# Patient Record
Sex: Female | Born: 1970 | Race: Black or African American | Hispanic: No | Marital: Married | Smoking: Never smoker
Health system: Southern US, Community
[De-identification: ages and names within clinical notes are randomized; demographics above are authoritative.]

## PROBLEM LIST (undated history)

## (undated) DIAGNOSIS — Z9989 Dependence on other enabling machines and devices: Secondary | ICD-10-CM

## (undated) DIAGNOSIS — R569 Unspecified convulsions: Secondary | ICD-10-CM

## (undated) DIAGNOSIS — R7303 Prediabetes: Secondary | ICD-10-CM

## (undated) DIAGNOSIS — E079 Disorder of thyroid, unspecified: Secondary | ICD-10-CM

## (undated) DIAGNOSIS — S83249A Other tear of medial meniscus, current injury, unspecified knee, initial encounter: Secondary | ICD-10-CM

## (undated) DIAGNOSIS — M549 Dorsalgia, unspecified: Secondary | ICD-10-CM

## (undated) DIAGNOSIS — G4733 Obstructive sleep apnea (adult) (pediatric): Secondary | ICD-10-CM

## (undated) DIAGNOSIS — E7800 Pure hypercholesterolemia, unspecified: Secondary | ICD-10-CM

## (undated) DIAGNOSIS — E785 Hyperlipidemia, unspecified: Secondary | ICD-10-CM

## (undated) DIAGNOSIS — D649 Anemia, unspecified: Secondary | ICD-10-CM

## (undated) DIAGNOSIS — I10 Essential (primary) hypertension: Secondary | ICD-10-CM

## (undated) DIAGNOSIS — M1712 Unilateral primary osteoarthritis, left knee: Secondary | ICD-10-CM

## (undated) DIAGNOSIS — M7021 Olecranon bursitis, right elbow: Secondary | ICD-10-CM

## (undated) HISTORY — DX: Dependence on other enabling machines and devices: Z99.89

## (undated) HISTORY — DX: Pure hypercholesterolemia, unspecified: E78.00

## (undated) HISTORY — DX: Unilateral primary osteoarthritis, left knee: M17.12

## (undated) HISTORY — DX: Anemia, unspecified: D64.9

## (undated) HISTORY — DX: Olecranon bursitis, right elbow: M70.21

## (undated) HISTORY — DX: Hyperlipidemia, unspecified: E78.5

## (undated) HISTORY — DX: Prediabetes: R73.03

## (undated) HISTORY — DX: Dorsalgia, unspecified: M54.9

## (undated) HISTORY — DX: Obstructive sleep apnea (adult) (pediatric): G47.33

## (undated) HISTORY — DX: Other tear of medial meniscus, current injury, unspecified knee, initial encounter: S83.249A

---

## 2009-10-24 ENCOUNTER — Other Ambulatory Visit: Admission: RE | Admit: 2009-10-24 | Discharge: 2009-10-24 | Payer: Self-pay | Admitting: *Deleted

## 2011-07-22 ENCOUNTER — Other Ambulatory Visit: Payer: Self-pay | Admitting: Family Medicine

## 2011-07-22 ENCOUNTER — Other Ambulatory Visit (HOSPITAL_COMMUNITY)
Admission: RE | Admit: 2011-07-22 | Discharge: 2011-07-22 | Disposition: A | Discharge status: Home / Self Care | Payer: BC Managed Care – PPO | Source: Ambulatory Visit | Attending: Internal Medicine | Admitting: Internal Medicine

## 2011-07-22 DIAGNOSIS — R2230 Localized swelling, mass and lump, unspecified upper limb: Secondary | ICD-10-CM

## 2011-07-22 DIAGNOSIS — R222 Localized swelling, mass and lump, trunk: Secondary | ICD-10-CM

## 2011-07-22 DIAGNOSIS — Z01419 Encounter for gynecological examination (general) (routine) without abnormal findings: Secondary | ICD-10-CM | POA: Insufficient documentation

## 2011-07-29 ENCOUNTER — Other Ambulatory Visit: Payer: Self-pay

## 2012-03-18 ENCOUNTER — Other Ambulatory Visit: Payer: Self-pay | Admitting: Family Medicine

## 2012-03-18 DIAGNOSIS — Z1231 Encounter for screening mammogram for malignant neoplasm of breast: Secondary | ICD-10-CM

## 2012-04-01 ENCOUNTER — Ambulatory Visit: Payer: BC Managed Care – PPO

## 2013-04-06 ENCOUNTER — Other Ambulatory Visit: Payer: Self-pay | Admitting: Family Medicine

## 2013-04-06 ENCOUNTER — Other Ambulatory Visit (HOSPITAL_COMMUNITY)
Admission: RE | Admit: 2013-04-06 | Discharge: 2013-04-06 | Disposition: A | Discharge status: Home / Self Care | Payer: BC Managed Care – PPO | Source: Ambulatory Visit | Attending: Family Medicine | Admitting: Family Medicine

## 2013-04-06 DIAGNOSIS — Z0000 Encounter for general adult medical examination without abnormal findings: Secondary | ICD-10-CM | POA: Insufficient documentation

## 2013-04-29 ENCOUNTER — Other Ambulatory Visit: Payer: Self-pay | Admitting: Family Medicine

## 2013-04-29 ENCOUNTER — Other Ambulatory Visit: Payer: Self-pay

## 2013-04-29 DIAGNOSIS — Z1231 Encounter for screening mammogram for malignant neoplasm of breast: Secondary | ICD-10-CM

## 2013-05-31 ENCOUNTER — Other Ambulatory Visit: Payer: Self-pay | Admitting: Family Medicine

## 2013-05-31 ENCOUNTER — Ambulatory Visit: Payer: BC Managed Care – PPO

## 2013-05-31 ENCOUNTER — Ambulatory Visit
Admission: RE | Admit: 2013-05-31 | Discharge: 2013-05-31 | Disposition: A | Discharge status: Home / Self Care | Payer: 59 | Source: Ambulatory Visit

## 2013-05-31 DIAGNOSIS — R928 Other abnormal and inconclusive findings on diagnostic imaging of breast: Secondary | ICD-10-CM

## 2013-05-31 DIAGNOSIS — Z1231 Encounter for screening mammogram for malignant neoplasm of breast: Secondary | ICD-10-CM

## 2013-06-16 ENCOUNTER — Ambulatory Visit
Admission: RE | Admit: 2013-06-16 | Discharge: 2013-06-16 | Disposition: A | Discharge status: Home / Self Care | Payer: 59 | Source: Ambulatory Visit | Attending: Family Medicine | Admitting: Family Medicine

## 2013-06-16 DIAGNOSIS — R928 Other abnormal and inconclusive findings on diagnostic imaging of breast: Secondary | ICD-10-CM

## 2013-10-10 ENCOUNTER — Emergency Department (HOSPITAL_COMMUNITY): Payer: 59

## 2013-10-10 ENCOUNTER — Encounter (HOSPITAL_COMMUNITY): Payer: Self-pay | Admitting: Emergency Medicine

## 2013-10-10 ENCOUNTER — Emergency Department (HOSPITAL_COMMUNITY)
Admission: EM | Admit: 2013-10-10 | Discharge: 2013-10-10 | Disposition: A | Discharge status: Home / Self Care | Payer: 59 | Attending: Emergency Medicine | Admitting: Emergency Medicine

## 2013-10-10 ENCOUNTER — Encounter: Payer: Self-pay | Admitting: *Deleted

## 2013-10-10 DIAGNOSIS — R609 Edema, unspecified: Secondary | ICD-10-CM | POA: Insufficient documentation

## 2013-10-10 DIAGNOSIS — E079 Disorder of thyroid, unspecified: Secondary | ICD-10-CM | POA: Insufficient documentation

## 2013-10-10 DIAGNOSIS — Z79899 Other long term (current) drug therapy: Secondary | ICD-10-CM | POA: Insufficient documentation

## 2013-10-10 DIAGNOSIS — R791 Abnormal coagulation profile: Secondary | ICD-10-CM | POA: Insufficient documentation

## 2013-10-10 DIAGNOSIS — R079 Chest pain, unspecified: Secondary | ICD-10-CM

## 2013-10-10 DIAGNOSIS — I10 Essential (primary) hypertension: Secondary | ICD-10-CM | POA: Insufficient documentation

## 2013-10-10 DIAGNOSIS — R072 Precordial pain: Secondary | ICD-10-CM | POA: Insufficient documentation

## 2013-10-10 DIAGNOSIS — R569 Unspecified convulsions: Secondary | ICD-10-CM | POA: Insufficient documentation

## 2013-10-10 DIAGNOSIS — R0602 Shortness of breath: Secondary | ICD-10-CM | POA: Insufficient documentation

## 2013-10-10 HISTORY — DX: Unspecified convulsions: R56.9

## 2013-10-10 HISTORY — DX: Disorder of thyroid, unspecified: E07.9

## 2013-10-10 HISTORY — DX: Essential (primary) hypertension: I10

## 2013-10-10 LAB — BASIC METABOLIC PANEL
BUN: 22 mg/dL (ref 6–23)
CO2: 30 mEq/L (ref 19–32)
Calcium: 9.7 mg/dL (ref 8.4–10.5)
Chloride: 98 mEq/L (ref 96–112)
Creatinine, Ser: 0.77 mg/dL (ref 0.50–1.10)
GFR calc Af Amer: >90 mL/min (ref >90)
GFR calc non Af Amer: >90 mL/min (ref >90)
Glucose, Bld: 96 mg/dL (ref 70–99)
Potassium: 3.9 mEq/L (ref 3.5–5.1)
Sodium: 135 mEq/L (ref 135–145)

## 2013-10-10 LAB — CBC
HCT: 34.6 % — ABNORMAL LOW (ref 36.0–46.0)
Hemoglobin: 11.2 g/dL — ABNORMAL LOW (ref 12.0–15.0)
MCH: 24 pg — ABNORMAL LOW (ref 26.0–34.0)
MCHC: 32.4 g/dL (ref 30.0–36.0)
MCV: 74.1 fL — ABNORMAL LOW (ref 78.0–100.0)
Platelets: 332 10*3/uL (ref 150–400)
RBC: 4.67 MIL/uL (ref 3.87–5.11)
RDW: 15 % (ref 11.5–15.5)
WBC: 10.9 10*3/uL — ABNORMAL HIGH (ref 4.0–10.5)

## 2013-10-10 LAB — POCT I-STAT TROPONIN I: Troponin i, poc: 0 ng/mL (ref 0.00–0.08)

## 2013-10-10 LAB — D-DIMER, QUANTITATIVE: D-Dimer, Quant: 0.85 ug/mL-FEU — ABNORMAL HIGH (ref 0.00–0.48)

## 2013-10-10 MED ORDER — IOHEXOL 350 MG/ML SOLN
80.0000 mL | Freq: Once | INTRAVENOUS | Status: AC | PRN
  Administered 2013-10-10: 80 mL via INTRAVENOUS

## 2013-10-10 NOTE — Progress Notes (Signed)
Patient ID: Pam Mckee, female   DOB: May 23, 1971, 42 y.o.   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  Pain is sharp, denies radiation down arms or shortness of breath.  Notes she had spicy jerk chicken last night for dinner.  Takes HCTZ 12.5 mg qd.  BP was 170/105, pulse 76.  Dr. Darrick Penna evaluated, heart and lung sounds normal.  EKG done- it was normal also.  Pt called and scheduled an appointment with her PCP for today at 11:15 am.

## 2013-10-10 NOTE — ED Provider Notes (Signed)
CSN: [id number]     Arrival date & time 10/10/13  1737 History   First MD Initiated Contact with Patient 10/10/13 1810     No chief complaint on file.  (Consider location/radiation/quality/duration/timing/severity/associated sxs/prior Treatment) The history is provided by the patient.  Posey Jasmin is a 42 y.o. female history of hypertension, hypothyroid here presenting with chest pain. Substernal chest pain, a "pinching" feeling today. Took some deep breath and the pain improved. Went to see her doctor and had an elevated d-dimer. Denies shortness of breath or calf pain. She does have some bilateral leg swelling. She drove to [place] and back over the weekend. Denies any history of blood clots. No CAD risk factors.    Past Medical History  Diagnosis Date  . Seizures   . Thyroid disease     hypo  . Hypertension    History reviewed. No pertinent past surgical history. No family history on file. History  Substance Use Topics  . Smoking status: Never Smoker   . Smokeless tobacco: Not on file  . Alcohol Use: No   OB History   Grav Para Term Preterm Abortions TAB SAB Ect Mult Living                 Review of Systems  Cardiovascular: Positive for chest pain.  All other systems reviewed and are negative.    Allergies  Review of patient's allergies indicates no known allergies.  Home Medications   Current Outpatient Rx  Name  Route  Sig  Dispense  Refill  . hydrochlorothiazide (MICROZIDE) 12.5 MG capsule   Oral   Take 12.5 mg by mouth daily.         Marland Kitchen levothyroxine (SYNTHROID, LEVOTHROID) 50 MCG tablet   Oral   Take 50 mcg by mouth daily before breakfast.         . losartan (COZAAR) 50 MG tablet   Oral   Take 50 mg by mouth daily.          BP 154/98  Pulse 72  Temp(Src) 98.6 F (37 C)  Resp 16  Ht 5\' 6"  (1.676 m)  Wt 292 lb (132.45 kg)  BMI 47.15 kg/m2  SpO2 100%  LMP 10/03/2013 Physical Exam  Nursing note and vitals reviewed. Constitutional: She is  oriented to person, place, and time. She appears well-developed and well-nourished.  HENT:  Head: Normocephalic.  Mouth/Throat: Oropharynx is clear and moist.  Eyes: Conjunctivae are normal. Pupils are equal, round, and reactive to light.  Neck: Normal range of motion. Neck supple.  Cardiovascular: Normal rate, regular rhythm and normal heart sounds.   Pulmonary/Chest: Effort normal and breath sounds normal. No respiratory distress. She has no wheezes. She has no rales. She exhibits no tenderness.  Abdominal: Soft. Bowel sounds are normal. She exhibits no distension. There is no tenderness. There is no rebound.  Musculoskeletal: Normal range of motion.  1+ edema bilaterally. No calf tenderness   Neurological: She is alert and oriented to person, place, and time.  Skin: Skin is warm and dry.  Psychiatric: She has a normal mood and affect. Her behavior is normal. Judgment and thought content normal.    ED Course  Procedures (including critical care time) Labs Review Labs Reviewed  CBC - Abnormal; Notable for the following:    WBC 10.9 (*)    Hemoglobin 11.2 (*)    HCT 34.6 (*)    MCV 74.1 (*)    MCH 24.0 (*)    All other components within  normal limits  D-DIMER, QUANTITATIVE - Abnormal; Notable for the following:    D-Dimer, Quant 0.85 (*)    All other components within normal limits  BASIC METABOLIC PANEL  POCT I-STAT TROPONIN I   Imaging Review Dg Chest 2 View  10/10/2013   CLINICAL DATA:  CHEST PAIN.  HYPERTENSION.  EXAM: CHEST  2 VIEW  COMPARISON:  None.  FINDINGS: There is borderline cardiomegaly. Pulmonary vascularity is normal.  There is vague increased density over the medial aspect of the right clavicle in the right upper lobe. I recommend an apical lordotic view of the chest to exclude a mass at that region. Lungs are otherwise clear. No osseous abnormality of significance.  IMPRESSION: Subtle increased densities in the right upper lobe which probably represents a  confluence of normal structures. Apical lordotic view of the chest recommended for further evaluation.   Electronically Signed   By: Geanie Cooley M.D.   On: 10/10/2013 19:25   Ct Angio Chest Pe W/cm &/or Wo Cm  10/10/2013   CLINICAL DATA:  Chest pain and shortness of breath.  EXAM: CT ANGIOGRAPHY CHEST WITH CONTRAST  TECHNIQUE: Multidetector CT imaging of the chest was performed using the standard protocol during bolus administration of intravenous contrast. Multiplanar CT image reconstructions including MIPs were obtained to evaluate the vascular anatomy.  CONTRAST:  80mL OMNIPAQUE IOHEXOL 350 MG/ML SOLN  COMPARISON:  Chest x-ray dated 10/10/2013  FINDINGS: There is cardiomegaly. There are no infiltrates or effusions. No definitive pulmonary emboli. There is motion artifact particularly in the lower lobes. The visualized portion of the upper abdomen is normal. No acute osseous abnormality.  Review of the MIP images confirms the above findings.  IMPRESSION: No pulmonary emboli. Slight cardiomegaly. The area of concern on chest x-ray of 10/10/2013 in the medial aspect of the right upper lobe represents a confluence of vessels. There is no mass or other worrisome abnormality in that area.   Electronically Signed   By: Geanie Cooley M.D.   On: 10/10/2013 20:01    EKG Interpretation     Ventricular Rate:  76 PR Interval:  178 QRS Duration: 88 QT Interval:  384 QTC Calculation: 432 R Axis:   49 Text Interpretation:  Normal sinus rhythm Normal ECG No previous ECGs available            MDM  No diagnosis found. Peony Barner is a 42 y.o. female here with elevated d-dimer. Symptoms not consistent with PE so will recheck d-dimer. If still elevated will get ct angio chest. I doubt ACS.   8:23 PM D-dimer mildly elevated. CT showed confluence of vessels, no PE. Stable for outpatient f/u.   Richardean Canal, MD 10/10/13 2024

## 2013-10-10 NOTE — ED Notes (Signed)
Pt was sent here by her pcp for a D-dimer of 1.01.  Her D-dimer was drawn b/c she felt "pinching" in her chest that lasted several seconds.  She took deep breaths and the pain decreased. EKG's were normal per pt.  Presently denies sob or chest pain, though states she was wheezing yesterday.  Drove to [place] and back over the weekend.

## 2014-05-02 ENCOUNTER — Ambulatory Visit (INDEPENDENT_AMBULATORY_CARE_PROVIDER_SITE_OTHER): Payer: 59 | Admitting: Emergency Medicine

## 2014-05-02 VITALS — BP 142/88 | HR 101 | Temp 99.0°F | Resp 17 | Ht 65.5 in | Wt 285.0 lb

## 2014-05-02 DIAGNOSIS — J209 Acute bronchitis, unspecified: Secondary | ICD-10-CM

## 2014-05-02 DIAGNOSIS — R05 Cough: Secondary | ICD-10-CM

## 2014-05-02 DIAGNOSIS — R059 Cough, unspecified: Secondary | ICD-10-CM

## 2014-05-02 DIAGNOSIS — R0600 Dyspnea, unspecified: Secondary | ICD-10-CM

## 2014-05-02 DIAGNOSIS — R0609 Other forms of dyspnea: Secondary | ICD-10-CM

## 2014-05-02 DIAGNOSIS — R0989 Other specified symptoms and signs involving the circulatory and respiratory systems: Secondary | ICD-10-CM

## 2014-05-02 MED ORDER — PREDNISONE 20 MG PO TABS: 40.0000 mg | ORAL_TABLET | Freq: Every day | ORAL | Status: DC

## 2014-05-02 MED ORDER — AZITHROMYCIN 250 MG PO TABS: ORAL_TABLET | ORAL | Status: DC

## 2014-05-02 MED ORDER — ALBUTEROL SULFATE HFA 108 (90 BASE) MCG/ACT IN AERS: 2.0000 | INHALATION_SPRAY | RESPIRATORY_TRACT | Status: DC | PRN

## 2014-05-02 NOTE — Progress Notes (Addendum)
   Subjective:    Patient ID: Pam Mckee, female    DOB: 1971/02/08, 43 y.o.   MRN: [id number]  HPI 43 yo female with complaint of cough and shortness of breath for past two days.  No fever.  Positive productive cough with yellow sputum.  Nonsmoker.  No history of prior episodes.  No chest pain.  No runny nose.  Denies other URI type symptoms.  PPMH:  Hypertension  SH:  Non smoker, no alcohol   Review of Systems  Constitutional: Negative for fever and chills.  HENT: Negative for congestion, postnasal drip, rhinorrhea and sinus pressure.   Respiratory: Positive for cough, chest tightness, shortness of breath and wheezing.   Cardiovascular: Negative for chest pain.  Gastrointestinal: Negative for nausea, vomiting, diarrhea and constipation.       Objective:   Physical Exam Blood pressure 142/88, pulse 101, temperature 99 F (37.2 C), temperature source Oral, resp. rate 17, height 5' 5.5" (1.664 m), weight 285 lb (129.275 kg), last menstrual period 04/18/2014, SpO2 97.00%. Body mass index is 46.69 kg/(m^2). Well-developed, well nourished female  who is awake, alert and oriented, in NAD. HEENT: [place]/AT, PERRL, EOMI.  Sclera and conjunctiva are clear.  OP is clear. Neck: supple, non-tender, no lymphadenopathy, thyromegaly. Heart: RRR, no murmur Lungs: normal effort, faint end expiratory wheeze. Abdomen: normo-active bowel sounds, supple, non-tender, no mass or organomegaly. Extremities: no cyanosis, clubbing or edema. Skin: warm and dry without rash. Psychologic: good mood and appropriate affect, normal speech and behavior.      Assessment & Plan:  Bronchitis  Treat with Zpak, prednisone burst and albuterol prn.  Follow up as needed.  I have reviewed and agree with documentation. Robert P. Laney Pastor, M.D.

## 2014-06-29 ENCOUNTER — Other Ambulatory Visit: Payer: Self-pay | Admitting: Family Medicine

## 2014-06-29 ENCOUNTER — Other Ambulatory Visit (HOSPITAL_COMMUNITY)
Admission: RE | Admit: 2014-06-29 | Discharge: 2014-06-29 | Disposition: A | Discharge status: Home / Self Care | Payer: 59 | Source: Ambulatory Visit | Attending: Family Medicine | Admitting: Family Medicine

## 2014-06-29 DIAGNOSIS — Z0000 Encounter for general adult medical examination without abnormal findings: Secondary | ICD-10-CM | POA: Insufficient documentation

## 2014-07-05 LAB — CYTOLOGY - PAP

## 2014-09-19 ENCOUNTER — Other Ambulatory Visit: Payer: Self-pay | Admitting: Family Medicine

## 2014-09-19 DIAGNOSIS — R921 Mammographic calcification found on diagnostic imaging of breast: Secondary | ICD-10-CM

## 2014-09-28 ENCOUNTER — Ambulatory Visit
Admission: RE | Admit: 2014-09-28 | Discharge: 2014-09-28 | Disposition: A | Discharge status: Home / Self Care | Payer: 59 | Source: Ambulatory Visit | Attending: Family Medicine | Admitting: Family Medicine

## 2014-09-28 DIAGNOSIS — R921 Mammographic calcification found on diagnostic imaging of breast: Secondary | ICD-10-CM

## 2015-02-09 ENCOUNTER — Encounter: Payer: Self-pay | Admitting: Family Medicine

## 2015-02-09 ENCOUNTER — Ambulatory Visit (INDEPENDENT_AMBULATORY_CARE_PROVIDER_SITE_OTHER): Payer: 59 | Admitting: Family Medicine

## 2015-02-09 VITALS — BP 140/80 | HR 83 | Temp 98.0°F | Resp 16 | Ht 66.0 in | Wt 273.6 lb

## 2015-02-09 DIAGNOSIS — I10 Essential (primary) hypertension: Secondary | ICD-10-CM | POA: Diagnosis not present

## 2015-02-09 DIAGNOSIS — E049 Nontoxic goiter, unspecified: Secondary | ICD-10-CM

## 2015-02-09 DIAGNOSIS — E039 Hypothyroidism, unspecified: Secondary | ICD-10-CM | POA: Diagnosis not present

## 2015-02-09 MED ORDER — LEVOTHYROXINE SODIUM 50 MCG PO TABS: 50.0000 ug | ORAL_TABLET | Freq: Every day | ORAL | Status: DC

## 2015-02-09 MED ORDER — LOSARTAN POTASSIUM-HCTZ 50-12.5 MG PO TABS: 1.0000 | ORAL_TABLET | Freq: Every day | ORAL | Status: DC

## 2015-02-09 NOTE — Patient Instructions (Signed)
Goiter Goiter is an enlarged thyroid gland. The thyroid gland sits at the base of the front of the neck. The gland produces hormones that regulate mood, body temperature, pulse rate, and digestion. Most goiters are painless and are not a cause for serious concern. Goiters and conditions that cause goiters can be treated if necessary.  CAUSES  Common causes of goiter include:  Graves disease (causes too much hormone to be produced [hyperthyroidism]).  Hashimoto disease (causes too little hormone to be produced [hypothyroidism]).  Thyroiditis (inflammation of the thyroid sometimes caused by virus or pregnancy).  Nodular goiter (small bumps form; sometimes called toxic nodular goiter).  Pregnancy.  Thyroid cancer (very few goiters with nodules are cancerous).  Certain medications.  Radiation exposure.  Iodine deficiency (more common in developing countries in inland populations). RISK FACTORS Risk factors for goiter include:  A family history of goiter.  Female gender.  Inadequate iodine in the diet.  Age older than 25 years. SYMPTOMS  Many goiters do not cause symptoms. When symptoms do occur, they may include:  Swelling in the lower part of the neck. This swelling can range from a very small bump to a large lump.  A tight feeling in the throat.  A hoarse voice. Less commonly, a goiter may result in:  Coughing.  Wheezing.  Difficulty swallowing.  Difficulty breathing.  Bulging neck veins.  Dizziness. When a goiter is the result of hyperthyroidism, symptoms may include:  Rapid or irregular heartbeat.  Sickness in your stomach (nausea).  Vomiting.  Diarrhea.  Shaking.  Irritable feeling.  Bulging eyes.  Weight loss.  Heat sensitivity.  Anxiety. When a goiter is the result of hypothyroidism, symptoms may include:  Tiredness.  Dry skin.  Constipation.  Weight gain.  Irregular menstrual cycle.  Depressed mood.  Sensitivity to  cold. DIAGNOSIS  Tests used to diagnose goiter include:  A physical exam.  Blood tests, including thyroid hormone levels and antibody testing.  Ultrasonography, computerized X-ray scan (computed tomography, CT) or computerized magnetic scan (magnetic resonance imaging, MRI).  Thyroid scan (imaging along with safe radioactive injection).  Tissue sample taken (biopsy) of nodules. This is sometimes done to confirm that the nodules are not cancerous. TREATMENT  Treatment will depend on the cause of the goiter. Treatment may include:  Monitoring. In some cases, no treatment is necessary, and your doctor will monitor your condition at regular checkups.  Medications and supplements. Thyroid medication (thyroid hormone replacement) is available for hyperthyroidism and hypothyroidism.  If inflammation is the cause, over-the-counter medication or steroid medication may be recommended.  Goiters caused by iodine deficiency can be treated with iodine supplements or changes in diet.  Radioactive iodine treatment. Radioactive iodine is injected into the blood. It travels to the thyroid gland, kills thyroid cells, and reduces the size of the gland. This is only used when the thyroid gland is overactive. Lifelong thyroid hormone medication is often necessary after this treatment.  Surgery. A procedure to remove all or part of the gland may be recommended in severe cases or when cancer is the cause. Hormones can be taken to replace the hormones normally produced by the thyroid. HOME CARE INSTRUCTIONS   Take medications as directed.  Follow your caregiver's recommendations for any dietary changes.  Follow up with your caregiver for further examination and testing, as directed. PREVENTION   If you have a family history of goiter, discuss screening with your doctor.  Make sure you are getting enough iodine in your diet.  Use  of iodized table salt can help prevent iodine deficiency. Document  Released: 05/14/2010 Document Revised: 04/10/2014 Document Reviewed: 05/14/2010 [facility] Patient Information 2015 [place], [place]. This information is not intended to replace advice given to you by your health care provider. Make sure you discuss any questions you have with your health care provider. Managing Your High Blood Pressure Blood pressure is a measurement of how forceful your blood is pressing against the walls of the arteries. Arteries are muscular tubes within the circulatory system. Blood pressure does not stay the same. Blood pressure rises when you are active, excited, or nervous; and it lowers during sleep and relaxation. If the numbers measuring your blood pressure stay above normal most of the time, you are at risk for health problems. High blood pressure (hypertension) is a long-term (chronic) condition in which blood pressure is elevated. A blood pressure reading is recorded as two numbers, such as 120 over 80 (or 120/80). The first, higher number is called the systolic pressure. It is a measure of the pressure in your arteries as the heart beats. The second, lower number is called the diastolic pressure. It is a measure of the pressure in your arteries as the heart relaxes between beats.  Keeping your blood pressure in a normal range is important to your overall health and prevention of health problems, such as heart disease and stroke. When your blood pressure is uncontrolled, your heart has to work harder than normal. High blood pressure is a very common condition in adults because blood pressure tends to rise with age. Men and women are equally likely to have hypertension but at different times in life. Before age 68, men are more likely to have hypertension. After 43 years of age, women are more likely to have it. Hypertension is especially common in African Americans. This condition often has no signs or symptoms. The cause of the condition is usually not known. Your caregiver can help  you come up with a plan to keep your blood pressure in a normal, healthy range. BLOOD PRESSURE STAGES Blood pressure is classified into four stages: normal, prehypertension, stage 1, and stage 2. Your blood pressure reading will be used to determine what type of treatment, if any, is necessary. Appropriate treatment options are tied to these four stages:  Normal  Systolic pressure (mm Hg): below 120.  Diastolic pressure (mm Hg): below 80. Prehypertension  Systolic pressure (mm Hg): 120 to 139.  Diastolic pressure (mm Hg): 80 to 89. Stage1  Systolic pressure (mm Hg): 140 to 159.  Diastolic pressure (mm Hg): 90 to 99. Stage2  Systolic pressure (mm Hg): 160 or above.  Diastolic pressure (mm Hg): 100 or above. RISKS RELATED TO HIGH BLOOD PRESSURE Managing your blood pressure is an important responsibility. Uncontrolled high blood pressure can lead to:  A heart attack.  A stroke.  A weakened blood vessel (aneurysm).  Heart failure.  Kidney damage.  Eye damage.  Metabolic syndrome.  Memory and concentration problems. HOW TO MANAGE YOUR BLOOD PRESSURE Blood pressure can be managed effectively with lifestyle changes and medicines (if needed). Your caregiver will help you come up with a plan to bring your blood pressure within a normal range. Your plan should include the following: Education  Read all information provided by your caregivers about how to control blood pressure.  Educate yourself on the latest guidelines and treatment recommendations. New research is always being done to further define the risks and treatments for high blood pressure. Lifestylechanges  Control your  weight.  Avoid smoking.  Stay physically active.  Reduce the amount of salt in your diet.  Reduce stress.  Control any chronic conditions, such as high cholesterol or diabetes.  Reduce your alcohol intake. Medicines  Several medicines (antihypertensive medicines) are available, if  needed, to bring blood pressure within a normal range. Communication  Review all the medicines you take with your caregiver because there may be side effects or interactions.  Talk with your caregiver about your diet, exercise habits, and other lifestyle factors that may be contributing to high blood pressure.  See your caregiver regularly. Your caregiver can help you create and adjust your plan for managing high blood pressure. RECOMMENDATIONS FOR TREATMENT AND FOLLOW-UP  The following recommendations are based on current guidelines for managing high blood pressure in nonpregnant adults. Use these recommendations to identify the proper follow-up period or treatment option based on your blood pressure reading. You can discuss these options with your caregiver.  Systolic pressure of 408 to 144 or diastolic pressure of 80 to 89: Follow up with your caregiver as directed.  Systolic pressure of 818 to 563 or diastolic pressure of 90 to 100: Follow up with your caregiver within 2 months.  Systolic pressure above 149 or diastolic pressure above 702: Follow up with your caregiver within 1 month.  Systolic pressure above 637 or diastolic pressure above 858: Consider antihypertensive therapy; follow up with your caregiver within 1 week.  Systolic pressure above 850 or diastolic pressure above 277: Begin antihypertensive therapy; follow up with your caregiver within 1 week. Document Released: 08/18/2012 Document Reviewed: 08/18/2012 [facility] Patient Information 2015 [place]. This information is not intended to replace advice given to you by your health care provider. Make sure you discuss any questions you have with your health care provider. DASH Eating Plan DASH stands for "Dietary Approaches to Stop Hypertension." The DASH eating plan is a healthy eating plan that has been shown to reduce high blood pressure (hypertension). Additional health benefits may include reducing the risk of type 2  diabetes mellitus, heart disease, and stroke. The DASH eating plan may also help with weight loss. WHAT DO I NEED TO KNOW ABOUT THE DASH EATING PLAN? For the DASH eating plan, you will follow these general guidelines:  Choose foods with a percent daily value for sodium of less than 5% (as listed on the food label).  Use salt-free seasonings or herbs instead of table salt or sea salt.  Check with your health care provider or pharmacist before using salt substitutes.  Eat lower-sodium products, often labeled as "lower sodium" or "no salt added."  Eat fresh foods.  Eat more vegetables, fruits, and low-fat dairy products.  Choose whole grains. Look for the word "whole" as the first word in the ingredient list.  Choose fish and skinless chicken or [place] more often than red meat. Limit fish, poultry, and meat to 6 oz (170 g) each day.  Limit sweets, desserts, sugars, and sugary drinks.  Choose heart-healthy fats.  Limit cheese to 1 oz (28 g) per day.  Eat more home-cooked food and less restaurant, buffet, and fast food.  Limit fried foods.  Cook foods using methods other than frying.  Limit canned vegetables. If you do use them, rinse them well to decrease the sodium.  When eating at a restaurant, ask that your food be prepared with less salt, or no salt if possible. WHAT FOODS CAN I EAT? Seek help from a dietitian for individual calorie needs. Grains Whole  grain or whole wheat bread. Brown rice. Whole grain or whole wheat pasta. Quinoa, bulgur, and whole grain cereals. Low-sodium cereals. Corn or whole wheat flour tortillas. Whole grain cornbread. Whole grain crackers. Low-sodium crackers. Vegetables Fresh or frozen vegetables (raw, steamed, roasted, or grilled). Low-sodium or reduced-sodium tomato and vegetable juices. Low-sodium or reduced-sodium tomato sauce and paste. Low-sodium or reduced-sodium canned vegetables.  Fruits All fresh, canned (in natural juice), or frozen  fruits. Meat and Other Protein Products Ground beef (85% or leaner), grass-fed beef, or beef trimmed of fat. Skinless chicken or [place]. Ground chicken or [place]. Pork trimmed of fat. All fish and seafood. Eggs. Dried beans, peas, or lentils. Unsalted nuts and seeds. Unsalted canned beans. Dairy Low-fat dairy products, such as skim or 1% milk, 2% or reduced-fat cheeses, low-fat ricotta or cottage cheese, or plain low-fat yogurt. Low-sodium or reduced-sodium cheeses. Fats and Oils Tub margarines without trans fats. Light or reduced-fat mayonnaise and salad dressings (reduced sodium). Avocado. Safflower, olive, or canola oils. Natural peanut or almond butter. Other Unsalted popcorn and pretzels. The items listed above may not be a complete list of recommended foods or beverages. Contact your dietitian for more options. WHAT FOODS ARE NOT RECOMMENDED? Grains White bread. White pasta. White rice. Refined cornbread. Bagels and croissants. Crackers that contain trans fat. Vegetables Creamed or fried vegetables. Vegetables in a cheese sauce. Regular canned vegetables. Regular canned tomato sauce and paste. Regular tomato and vegetable juices. Fruits Dried fruits. Canned fruit in light or heavy syrup. Fruit juice. Meat and Other Protein Products Fatty cuts of meat. Ribs, chicken wings, bacon, sausage, bologna, salami, chitterlings, fatback, hot dogs, bratwurst, and packaged luncheon meats. Salted nuts and seeds. Canned beans with salt. Dairy Whole or 2% milk, cream, half-and-half, and cream cheese. Whole-fat or sweetened yogurt. Full-fat cheeses or blue cheese. Nondairy creamers and whipped toppings. Processed cheese, cheese spreads, or cheese curds. Condiments Onion and garlic salt, seasoned salt, table salt, and sea salt. Canned and packaged gravies. Worcestershire sauce. Tartar sauce. Barbecue sauce. Teriyaki sauce. Soy sauce, including reduced sodium. Steak sauce. Fish sauce. Oyster sauce. Cocktail  sauce. Horseradish. Ketchup and mustard. Meat flavorings and tenderizers. Bouillon cubes. Hot sauce. Tabasco sauce. Marinades. Taco seasonings. Relishes. Fats and Oils Butter, stick margarine, lard, shortening, ghee, and bacon fat. Coconut, palm kernel, or palm oils. Regular salad dressings. Other Pickles and olives. Salted popcorn and pretzels. The items listed above may not be a complete list of foods and beverages to avoid. Contact your dietitian for more information. WHERE CAN I FIND MORE INFORMATION? National Heart, Lung, and Blood Institute: travelstabloid.com Document Released: 11/13/2011 Document Revised: 04/10/2014 Document Reviewed: 09/28/2013 [facility] Patient Information 2015 [place], [place]. This information is not intended to replace advice given to you by your health care provider. Make sure you discuss any questions you have with your health care provider.

## 2015-02-09 NOTE — Progress Notes (Signed)
Subjective:    Patient ID: Pam Mckee, female    DOB: [birth year], 44 y.o.   MRN: [id number]  This chart was scribed for Pam Knapp, MD by Stephania Fragmin, ED Scribe. This patient was seen in room 24 and the patient's care was started at 4:07 PM.   HPI   Chief Complaint  Patient presents with  . Establish Care    with refill on blood pressure medication    HPI Comments: Pam Mckee is a 44 y.o. female who presents to the [facility] to establish care and to refill hypertension medication. Patient hasn't taken any hypertension medication today because she has been out of medication for a month. Patient usually checks her BP at work at [employer], and reports that her blood pressure has been well controlled with losartan 50 mg/HCTZ 12.5 mg. She denies any associated urinary symptoms.   Patient takes levothyroxine, but ran out of medication 1 week ago. Patient has been on the same dose for the past 2 years.   Patient was given Pro-air for a URI, and denies having any asthma.   Patient's pap smear in May 2015 was normal. She denies ever having had an abnormal pap smear.   She takes Naproxen prn, due to aches and pains from standing all day. In addition to working at [employer], she also works at [employer].   Patient states she has known about having hypothyroidism about 19 years ago, since that was around the age she gave birth to her son. She states she hasn't noticed her thyroid getting any larger. She denies having had any recent ultrasounds for her neck.  Past Medical History  Diagnosis Date  . Seizures   . Thyroid disease     hypo  . Hypertension    No current outpatient prescriptions on file prior to visit.   No current facility-administered medications on file prior to visit.   No Known Allergies     Review of Systems  Constitutional: Negative for fever, chills, fatigue and unexpected weight change.  HENT: Negative for  rhinorrhea and sore throat.   Eyes: Negative for visual disturbance.  Respiratory: Negative for cough, chest tightness and shortness of breath.   Cardiovascular: Negative for chest pain, palpitations and leg swelling.  Gastrointestinal: Negative for nausea, vomiting, abdominal pain, diarrhea and blood in stool.  Genitourinary: Negative for dysuria.  Musculoskeletal: Negative for myalgias and back pain.  Skin: Negative for rash.  Neurological: Negative for dizziness, syncope, light-headedness and headaches.  Hematological: Does not bruise/bleed easily.       Objective:   Patient Vitals for the past 24 hrs:  BP Temp Temp src Pulse Resp SpO2 Height Weight  02/09/15 1544 140/80 mmHg 98 F (36.7 C) Oral 83 16 98 % 5\' 6"  (1.676 m) 273 lb 9.6 oz (124.104 kg)     Physical Exam  Constitutional: She is oriented to person, place, and time. She appears well-developed and well-nourished. No distress.  HENT:  Head: Normocephalic and atraumatic.  Eyes: Conjunctivae and EOM are normal.  Neck: Neck supple. No tracheal deviation present. Thyromegaly present.  Cardiovascular: Normal rate, regular rhythm, S1 normal, S2 normal and normal heart sounds.   No murmur heard. Pulmonary/Chest: Effort normal and breath sounds normal. No respiratory distress. She has no wheezes. She has no rhonchi. She has no rales.  Musculoskeletal: Normal range of motion.  Neurological: She is alert and oriented to person, place, and time.  Skin: Skin is warm and dry.  Psychiatric: She has a normal mood and affect. Her behavior is normal.  Nursing note and vitals reviewed.      Assessment & Plan:   Essential hypertension, benign - start BP meds - off  1 mo  Hypothyroidism, unspecified hypothyroidism type - Plan: US Soft Tissue Head/Neck - restart levothyroxine - check labs at next OV as has been off x sev d - dose stable for yrs, pt asymptomatic  Goiter - Plan: US Soft Tissue Head/Neck  Will get labs at f/u OV for  CPE in 3 mos - fasting.  Meds ordered this encounter  Medications  . naproxen sodium (ANAPROX) 550 MG tablet    Sig: Take 550 mg by mouth as needed.  Marland Kitchen levothyroxine (SYNTHROID, LEVOTHROID) 50 MCG tablet    Sig: Take 1 tablet (50 mcg total) by mouth daily before breakfast.    Dispense:  90 tablet    Refill:  1  . losartan-hydrochlorothiazide (HYZAAR) 50-12.5 MG per tablet    Sig: Take 1 tablet by mouth daily.    Dispense:  90 tablet    Refill:  1    I personally performed the services described in this documentation, which was scribed in my presence. The recorded information has been reviewed and considered, and addended by me as needed.  [name], MD MPH

## 2015-02-15 ENCOUNTER — Ambulatory Visit (INDEPENDENT_AMBULATORY_CARE_PROVIDER_SITE_OTHER): Payer: 59 | Admitting: Sports Medicine

## 2015-02-15 ENCOUNTER — Encounter: Payer: Self-pay | Admitting: Sports Medicine

## 2015-02-15 VITALS — BP 137/81 | Ht 67.0 in | Wt 268.0 lb

## 2015-02-15 DIAGNOSIS — M7021 Olecranon bursitis, right elbow: Secondary | ICD-10-CM | POA: Insufficient documentation

## 2015-02-15 DIAGNOSIS — I10 Essential (primary) hypertension: Secondary | ICD-10-CM | POA: Insufficient documentation

## 2015-02-15 HISTORY — DX: Olecranon bursitis, right elbow: M70.21

## 2015-02-15 MED ORDER — LOSARTAN POTASSIUM 50 MG PO TABS: 50.0000 mg | ORAL_TABLET | Freq: Every day | ORAL | Status: DC

## 2015-02-15 MED ORDER — DICLOFENAC SODIUM 75 MG PO TBEC: 75.0000 mg | DELAYED_RELEASE_TABLET | Freq: Two times a day (BID) | ORAL | Status: DC

## 2015-02-15 NOTE — Progress Notes (Signed)
Patient ID: FELICHA FRAYNE, female   DOB: 12-21-1970, 44 y.o.   MRN: [id number]  6 days ago added HCTZ to losarten 2 days ago woke up at night with RT elbow pain and whole arm hurt No injury Woke from dead sleep with pain 09/30/2023 Looked swollen   still painful and swollen Been using advil Unable to extend elbow  Exam NAD BP 137/81 mmHg  Ht 5\' 7"  (1.702 m)  Wt 268 lb (121.564 kg)  BMI 41.96 kg/m2  LMP 01/14/2015  RT elbow puffy and some swelling in post fossa of RT elbow Lacks 10 deg of full extension Lacks full supination by 20 deg TTP in post elbow over tip of olecranon Slt warm but not reddened  [place] There is a discreet swollen bursal type swelling in the soft tissue just superficail to the olecranon No joint effusion noted

## 2015-02-15 NOTE — Assessment & Plan Note (Signed)
WE used US guidance to do a directed injection of the bursa  Procedure:  Injection of RT olecranon bursa Consent obtained and verified. Time-out conducted. Noted no overlying erythema, induration, or other signs of local infection. Skin prepped in a sterile fashion. Topical analgesic spray: -none Completed without difficulty. Meds: 10 mg Solumedrol + 2 cc lidocaine Pain immediately improved suggesting accurate placement of the medication. Advised to call if fevers/chills, erythema, induration, drainage, or persistent bleeding.  Soft compression with ace wrap  Note while elbow felt better patient developed hives about 1 hour post injection  Start Diclofenac 75 bid  Check response in 1 week

## 2015-02-15 NOTE — Assessment & Plan Note (Signed)
Stop HCTZ for now with the likely gout attack  Go to Losarten 50  Reck BP for a few days

## 2015-02-16 ENCOUNTER — Ambulatory Visit: Payer: 59 | Admitting: Family Medicine

## 2015-02-19 ENCOUNTER — Ambulatory Visit
Admission: RE | Admit: 2015-02-19 | Discharge: 2015-02-19 | Disposition: A | Discharge status: Home / Self Care | Payer: 59 | Source: Ambulatory Visit | Attending: Family Medicine | Admitting: Family Medicine

## 2015-02-19 ENCOUNTER — Other Ambulatory Visit: Payer: Self-pay | Admitting: Family Medicine

## 2015-02-19 DIAGNOSIS — E049 Nontoxic goiter, unspecified: Secondary | ICD-10-CM

## 2015-02-19 DIAGNOSIS — E041 Nontoxic single thyroid nodule: Secondary | ICD-10-CM

## 2015-02-19 DIAGNOSIS — E039 Hypothyroidism, unspecified: Secondary | ICD-10-CM

## 2015-02-22 ENCOUNTER — Other Ambulatory Visit (HOSPITAL_COMMUNITY)
Admission: RE | Admit: 2015-02-22 | Discharge: 2015-02-22 | Disposition: A | Discharge status: Home / Self Care | Payer: 59 | Source: Ambulatory Visit | Attending: Interventional Radiology | Admitting: Interventional Radiology

## 2015-02-22 ENCOUNTER — Ambulatory Visit
Admission: RE | Admit: 2015-02-22 | Discharge: 2015-02-22 | Disposition: A | Discharge status: Home / Self Care | Payer: 59 | Source: Ambulatory Visit | Attending: Family Medicine | Admitting: Family Medicine

## 2015-02-22 DIAGNOSIS — E041 Nontoxic single thyroid nodule: Secondary | ICD-10-CM | POA: Insufficient documentation

## 2015-02-26 ENCOUNTER — Encounter: Payer: Self-pay | Admitting: Family Medicine

## 2015-02-27 ENCOUNTER — Telehealth: Payer: Self-pay

## 2015-02-27 NOTE — Telephone Encounter (Signed)
Pt LM on lab VM-unsure of why because she has not had any labs done. Will try to call tomorrow. Unable to call tonight.

## 2015-02-28 NOTE — Telephone Encounter (Signed)
LMOM to CB. 

## 2015-03-02 MED ORDER — HYDROCHLOROTHIAZIDE 25 MG PO TABS: 25.0000 mg | ORAL_TABLET | Freq: Every day | ORAL | Status: DC

## 2015-03-02 NOTE — Telephone Encounter (Signed)
Pt is calling about results of biopsy. She is also calling about the message she sent you in my chart on 3/21.

## 2015-03-02 NOTE — Telephone Encounter (Signed)
Called pt to discuss biopsy  and LVM but it was just a general generic VM - not sure it was pt so did not feel comfortable leaving detailed message.  Biopsy can't be interpreted without labs - need to have pt come into clinic asap to see me so we can check a variety of labs.  Also, pt is going to need an endocrinologist to help [place] make sure we are doing the correct thing w/ her thyroid - looks like she is supposed to call [facility] to sched.  Sent hctz into her pharmacy per her my chart request.

## 2015-03-09 ENCOUNTER — Ambulatory Visit (INDEPENDENT_AMBULATORY_CARE_PROVIDER_SITE_OTHER): Payer: 59 | Admitting: Family Medicine

## 2015-03-09 ENCOUNTER — Encounter: Payer: Self-pay | Admitting: Family Medicine

## 2015-03-09 VITALS — BP 133/90 | HR 69 | Temp 97.9°F | Resp 16 | Ht 66.5 in | Wt 269.6 lb

## 2015-03-09 DIAGNOSIS — E039 Hypothyroidism, unspecified: Secondary | ICD-10-CM

## 2015-03-09 DIAGNOSIS — Z79899 Other long term (current) drug therapy: Secondary | ICD-10-CM

## 2015-03-09 DIAGNOSIS — I10 Essential (primary) hypertension: Secondary | ICD-10-CM | POA: Diagnosis not present

## 2015-03-09 DIAGNOSIS — R0683 Snoring: Secondary | ICD-10-CM

## 2015-03-09 DIAGNOSIS — J302 Other seasonal allergic rhinitis: Secondary | ICD-10-CM | POA: Diagnosis not present

## 2015-03-09 DIAGNOSIS — E559 Vitamin D deficiency, unspecified: Secondary | ICD-10-CM | POA: Diagnosis not present

## 2015-03-09 DIAGNOSIS — R5383 Other fatigue: Secondary | ICD-10-CM | POA: Diagnosis not present

## 2015-03-09 DIAGNOSIS — T161XXA Foreign body in right ear, initial encounter: Secondary | ICD-10-CM

## 2015-03-09 LAB — COMPREHENSIVE METABOLIC PANEL
ALT: 10 U/L (ref 0–35)
AST: 14 U/L (ref 0–37)
Albumin: 3.6 g/dL (ref 3.5–5.2)
Alkaline Phosphatase: 69 U/L (ref 39–117)
BUN: 19 mg/dL (ref 6–23)
CO2: 27 mEq/L (ref 19–32)
Calcium: 8.9 mg/dL (ref 8.4–10.5)
Chloride: 101 mEq/L (ref 96–112)
Creat: 0.68 mg/dL (ref 0.50–1.10)
Glucose, Bld: 83 mg/dL (ref 70–99)
Potassium: 4.1 mEq/L (ref 3.5–5.3)
Sodium: 137 mEq/L (ref 135–145)
Total Bilirubin: 0.3 mg/dL (ref 0.2–1.2)
Total Protein: 6.8 g/dL (ref 6.0–8.3)

## 2015-03-09 LAB — CBC WITH DIFFERENTIAL/PLATELET
Basophils Absolute: 0 10*3/uL (ref 0.0–0.1)
Basophils Relative: 0 % (ref 0–1)
Eosinophils Absolute: 0.2 10*3/uL (ref 0.0–0.7)
Eosinophils Relative: 2 % (ref 0–5)
HCT: 36.2 % (ref 36.0–46.0)
Hemoglobin: 11.1 g/dL — ABNORMAL LOW (ref 12.0–15.0)
Lymphocytes Relative: 33 % (ref 12–46)
Lymphs Abs: 3.1 10*3/uL (ref 0.7–4.0)
MCH: 22.3 pg — ABNORMAL LOW (ref 26.0–34.0)
MCHC: 30.7 g/dL (ref 30.0–36.0)
MCV: 72.8 fL — ABNORMAL LOW (ref 78.0–100.0)
MPV: 10.7 fL (ref 8.6–12.4)
Monocytes Absolute: 0.5 10*3/uL (ref 0.1–1.0)
Monocytes Relative: 5 % (ref 3–12)
Neutro Abs: 5.6 10*3/uL (ref 1.7–7.7)
Neutrophils Relative %: 60 % (ref 43–77)
Platelets: 326 10*3/uL (ref 150–400)
RBC: 4.97 MIL/uL (ref 3.87–5.11)
RDW: 15.7 % — ABNORMAL HIGH (ref 11.5–15.5)
WBC: 9.3 10*3/uL (ref 4.0–10.5)

## 2015-03-09 LAB — LIPID PANEL
Cholesterol: 176 mg/dL (ref 0–200)
HDL: 50 mg/dL (ref >=46)
LDL Cholesterol: 111 mg/dL — ABNORMAL HIGH (ref 0–99)
Total CHOL/HDL Ratio: 3.5 Ratio
Triglycerides: 76 mg/dL (ref <150)
VLDL: 15 mg/dL (ref 0–40)

## 2015-03-09 LAB — THYROID PANEL WITH TSH
Free Thyroxine Index: 2.6 (ref 1.4–3.8)
T3 Uptake: 29 % (ref 22–35)
T4, Total: 9 ug/dL (ref 4.5–12.0)
TSH: 2.232 u[IU]/mL (ref 0.350–4.500)

## 2015-03-09 MED ORDER — HYDROCHLOROTHIAZIDE 25 MG PO TABS: 25.0000 mg | ORAL_TABLET | Freq: Every day | ORAL | Status: DC

## 2015-03-09 MED ORDER — LOSARTAN POTASSIUM 100 MG PO TABS
100.0000 mg | ORAL_TABLET | Freq: Every day | ORAL | Status: DC
Start: 2015-03-09 — End: 2016-01-24

## 2015-03-09 MED ORDER — CETIRIZINE HCL 10 MG PO TABS: 10.0000 mg | ORAL_TABLET | Freq: Every day | ORAL | Status: DC

## 2015-03-09 MED ORDER — NAPROXEN SODIUM 550 MG PO TABS: 550.0000 mg | ORAL_TABLET | Freq: Two times a day (BID) | ORAL | Status: DC | PRN

## 2015-03-09 NOTE — Progress Notes (Signed)
Subjective:    Patient ID: Pam Mckee, female    DOB: 1971-05-11, 44 y.o.   MRN: [id number] Chief Complaint  Patient presents with  . Follow-up  . Thyroid    HPI  Doing really well.  Has reviewed prior results online and here to discuss thyroid biopsy results. 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 Pt has been on a low dose of levothyroxine 50 qd for over 2 years which she has restarted and is tolerating well.  She was called to sched w/ [facility] endocrinology due to thyroid disorder of "undertermined significance" but she would prefer to see Dr. Chalmers Cater for this which I agree with.  Checking BP outside office - much improved.  Has restarted hctz without any trouble - doesn't think it triggered her olecranon bursitis - hopefully that just happened sev days after being placed on hctz coincidently - tests were negative for gout and pt was on this for several yrs piror w/o incident.  ON just the losartan, her systolic BP was 656C-127N/17G.  hctz has helped her pedal edema.    Past Medical History  Diagnosis Date  . Seizures   . Thyroid disease     hypo  . Hypertension    Current Outpatient Prescriptions on File Prior to Visit  Medication Sig Dispense Refill  . levothyroxine (SYNTHROID, LEVOTHROID) 50 MCG tablet Take 1 tablet (50 mcg total) by mouth daily before breakfast. 90 tablet 1   No current facility-administered medications on file prior to visit.   No Known Allergies    Review of Systems  Constitutional: Positive for fatigue and unexpected weight change. Negative for fever, chills, diaphoresis and appetite change.  HENT: Negative for trouble swallowing.   Eyes: Negative for visual disturbance.  Respiratory: Positive for apnea. Negative for cough and shortness of breath.   Cardiovascular: Negative for chest pain, palpitations and leg swelling.  Genitourinary:  Negative for decreased urine volume.  Neurological: Negative for syncope and headaches.  Hematological: Does not bruise/bleed easily.  Psychiatric/Behavioral: Positive for sleep disturbance.       Objective:   Physical Exam  Constitutional: She is oriented to person, place, and time. She appears well-developed and well-nourished. She does not appear ill. No distress.  HENT:  Head: Normocephalic and atraumatic.  Right Ear: External ear normal. A foreign body (old impacted qtip) is present.  Left Ear: Tympanic membrane, external ear and ear canal normal.  Nose: Mucosal edema and rhinorrhea present.  Mouth/Throat: Uvula is midline and mucous membranes are normal. No trismus in the jaw. No uvula swelling. Posterior oropharyngeal edema present. No oropharyngeal exudate, posterior oropharyngeal erythema or tonsillar abscesses.  Tiny anterior polyp bilaterally 2+ tonsils at baseline with white post-nasal drip Small oropharyngeal opening  Eyes: Conjunctivae are normal. Right eye exhibits no discharge. Left eye exhibits no discharge. No scleral icterus.  Neck: Trachea normal and normal range of motion. Neck supple. No tracheal tenderness present. Thyroid mass and thyromegaly present.  Cardiovascular: Normal rate, regular rhythm and intact distal pulses.   Murmur heard.  Decrescendo systolic murmur is present with a grade of 1/6  LUSB - suspect physiologic flow murmur as pt is fasting - recheck at f/u  Pulmonary/Chest: Effort normal and breath sounds normal.  Lymphadenopathy:       Head (right side): Submandibular adenopathy present. No preauricular and no posterior auricular adenopathy present.       Head (  left side): Submandibular adenopathy present. No preauricular and no posterior auricular adenopathy present.    She has no cervical adenopathy.       Right: No supraclavicular adenopathy present.       Left: No supraclavicular adenopathy present.  Neurological: She is alert and oriented to  person, place, and time.  Skin: Skin is warm and dry. She is not diaphoretic. No erythema.  Psychiatric: She has a normal mood and affect. Her behavior is normal.   BP 133/90 mmHg  Pulse 69  Temp(Src) 97.9 F (36.6 C) (Oral)  Resp 16  Ht 5' 6.5" (1.689 m)  Wt 269 lb 9.6 oz (122.29 kg)  BMI 42.87 kg/m2  SpO2 100%  LMP 02/12/2015         Assessment & Plan:   Hypothyroidism, unspecified hypothyroidism type - Plan: Thyroid Panel With TSH - restarted on levothyroxine 50 just 1 mo ago - but new [place] and biopsy show likely h/o thyroiditis so would appreciate endocrine c/s to ensure no further testing is necessary  Polypharmacy - Plan: Comprehensive metabolic panel  Other fatigue - Plan: CBC with Differential/Platelet, Lipid panel, Vit D  25 hydroxy (rtn osteoporosis monitoring)  Essential hypertension, benign - much improved on current losartan and hctz - continue and check bp at work [employer]  Snoring - Plan: Ambulatory referral to Sleep Studies -sxs consistent with OSA - pt encouraged to pursue diganosis and treatment now which she agrees to.  Foreign body in right auditory canal, initial encounter - cotton from qtip removed by lavage by cma  Severe obesity (BMI >= 40)  Seasonal allergic rhinitis  Vitamin D deficiency - start once wkly high dose x 6 mos, then at least 2000u/d otc vit D  Meds ordered this encounter  Medications  . hydrochlorothiazide (HYDRODIURIL) 25 MG tablet    Sig: Take 1 tablet (25 mg total) by mouth daily.    Dispense:  90 tablet    Refill:  3  . losartan (COZAAR) 100 MG tablet    Sig: Take 1 tablet (100 mg total) by mouth daily.    Dispense:  90 tablet    Refill:  3  . naproxen sodium (ANAPROX) 550 MG tablet    Sig: Take 1 tablet (550 mg total) by mouth 2 (two) times daily as needed (for pain with food).    Dispense:  180 tablet    Refill:  0  . cetirizine (ZYRTEC) 10 MG tablet    Sig: Take 1 tablet (10 mg total) by mouth at bedtime.    Dispense:  30  tablet    Refill:  11  . ergocalciferol (VITAMIN D2) 50000 UNITS capsule    Sig: Take 1 capsule (50,000 Units total) by mouth once a week.    Dispense:  12 capsule    Refill:  1    I personally performed the services described in this documentation, which was scribed in my presence. The recorded information has been reviewed and considered, and addended by me as needed.  [name], MD MPH

## 2015-03-09 NOTE — Patient Instructions (Signed)
Managing Your High Blood Pressure Blood pressure is a measurement of how forceful your blood is pressing against the walls of the arteries. Arteries are muscular tubes within the circulatory system. Blood pressure does not stay the same. Blood pressure rises when you are active, excited, or nervous; and it lowers during sleep and relaxation. If the numbers measuring your blood pressure stay above normal most of the time, you are at risk for health problems. High blood pressure (hypertension) is a long-term (chronic) condition in which blood pressure is elevated. A blood pressure reading is recorded as two numbers, such as 120 over 80 (or 120/80). The first, higher number is called the systolic pressure. It is a measure of the pressure in your arteries as the heart beats. The second, lower number is called the diastolic pressure. It is a measure of the pressure in your arteries as the heart relaxes between beats.  Keeping your blood pressure in a normal range is important to your overall health and prevention of health problems, such as heart disease and stroke. When your blood pressure is uncontrolled, your heart has to work harder than normal. High blood pressure is a very common condition in adults because blood pressure tends to rise with age. Men and women are equally likely to have hypertension but at different times in life. Before age 24, men are more likely to have hypertension. After 43 years of age, women are more likely to have it. Hypertension is especially common in African Americans. This condition often has no signs or symptoms. The cause of the condition is usually not known. Your caregiver can help you come up with a plan to keep your blood pressure in a normal, healthy range. BLOOD PRESSURE STAGES Blood pressure is classified into four stages: normal, prehypertension, stage 1, and stage 2. Your blood pressure reading will be used to determine what type of treatment, if any, is necessary.  Appropriate treatment options are tied to these four stages:  Normal  Systolic pressure (mm Hg): below 120.  Diastolic pressure (mm Hg): below 80. Prehypertension 1. Systolic pressure (mm Hg): 120 to 884. 2. Diastolic pressure (mm Hg): 80 to 89. Stage1  Systolic pressure (mm Hg): 140 to 159.  Diastolic pressure (mm Hg): 90 to 99. Stage2  Systolic pressure (mm Hg): 160 or above.  Diastolic pressure (mm Hg): 100 or above. RISKS RELATED TO HIGH BLOOD PRESSURE Managing your blood pressure is an important responsibility. Uncontrolled high blood pressure can lead to:  A heart attack.  A stroke.  A weakened blood vessel (aneurysm).  Heart failure.  Kidney damage.  Eye damage.  Metabolic syndrome.  Memory and concentration problems. HOW TO MANAGE YOUR BLOOD PRESSURE Blood pressure can be managed effectively with lifestyle changes and medicines (if needed). Your caregiver will help you come up with a plan to bring your blood pressure within a normal range. Your plan should include the following: Education  Read all information provided by your caregivers about how to control blood pressure.  Educate yourself on the latest guidelines and treatment recommendations. New research is always being done to further define the risks and treatments for high blood pressure. Lifestylechanges  Control your weight.  Avoid smoking.  Stay physically active.  Reduce the amount of salt in your diet.  Reduce stress.  Control any chronic conditions, such as high cholesterol or diabetes.  Reduce your alcohol intake. Medicines  Several medicines (antihypertensive medicines) are available, if needed, to bring blood pressure within a normal range.  Communication  Review all the medicines you take with your caregiver because there may be side effects or interactions.  Talk with your caregiver about your diet, exercise habits, and other lifestyle factors that may be contributing to  high blood pressure.  See your caregiver regularly. Your caregiver can help you create and adjust your plan for managing high blood pressure. RECOMMENDATIONS FOR TREATMENT AND FOLLOW-UP  The following recommendations are based on current guidelines for managing high blood pressure in nonpregnant adults. Use these recommendations to identify the proper follow-up period or treatment option based on your blood pressure reading. You can discuss these options with your caregiver.  Systolic pressure of 315 to 176 or diastolic pressure of 80 to 89: Follow up with your caregiver as directed.  Systolic pressure of 160 to 737 or diastolic pressure of 90 to 100: Follow up with your caregiver within 2 months.  Systolic pressure above 106 or diastolic pressure above 269: Follow up with your caregiver within 1 month.  Systolic pressure above 485 or diastolic pressure above 462: Consider antihypertensive therapy; follow up with your caregiver within 1 week.  Systolic pressure above 703 or diastolic pressure above 500: Begin antihypertensive therapy; follow up with your caregiver within 1 week. Document Released: 08/18/2012 Document Reviewed: 08/18/2012 [facility] Patient Information 2015 [place]. This information is not intended to replace advice given to you by your health care provider. Make sure you discuss any questions you have with your health care provider. Sleep Apnea  Sleep apnea is a sleep disorder characterized by abnormal pauses in breathing while you sleep. When your breathing pauses, the level of oxygen in your blood decreases. This causes you to move out of deep sleep and into light sleep. As a result, your quality of sleep is poor, and the system that carries your blood throughout your body (cardiovascular system) experiences stress. If sleep apnea remains untreated, the following conditions can develop:  High blood pressure (hypertension).  Coronary artery disease.  Inability to  achieve or maintain an erection (impotence).  Impairment of your thought process (cognitive dysfunction). There are three types of sleep apnea: 3. Obstructive sleep apnea--Pauses in breathing during sleep because of a blocked airway. 4. Central sleep apnea--Pauses in breathing during sleep because the area of the brain that controls your breathing does not send the correct signals to the muscles that control breathing. 5. Mixed sleep apnea--A combination of both obstructive and central sleep apnea. RISK FACTORS The following risk factors can increase your risk of developing sleep apnea:  Being overweight.  Smoking.  Having narrow passages in your nose and throat.  Being of older age.  Being female.  Alcohol use.  Sedative and tranquilizer use.  Ethnicity. Among individuals younger than 35 years, African Americans are at increased risk of sleep apnea. SYMPTOMS   Difficulty staying asleep.  Daytime sleepiness and fatigue.  Loss of energy.  Irritability.  Loud, heavy snoring.  Morning headaches.  Trouble concentrating.  Forgetfulness.  Decreased interest in sex. DIAGNOSIS  In order to diagnose sleep apnea, your caregiver will perform a physical examination. Your caregiver may suggest that you take a home sleep test. Your caregiver may also recommend that you spend the night in a sleep lab. In the sleep lab, several monitors record information about your heart, lungs, and brain while you sleep. Your leg and arm movements and blood oxygen level are also recorded. TREATMENT The following actions may help to resolve mild sleep apnea:  Sleeping on your side.  Using a decongestant if you have nasal congestion.   Avoiding the use of depressants, including alcohol, sedatives, and narcotics.   Losing weight and modifying your diet if you are overweight. There also are devices and treatments to help open your airway:  Oral appliances. These are custom-made mouthpieces  that shift your lower jaw forward and slightly open your bite. This opens your airway.  Devices that create positive airway pressure. This positive pressure "splints" your airway open to help you breathe better during sleep. The following devices create positive airway pressure:  Continuous positive airway pressure (CPAP) device. The CPAP device creates a continuous level of air pressure with an air pump. The air is delivered to your airway through a mask while you sleep. This continuous pressure keeps your airway open.  Nasal expiratory positive airway pressure (EPAP) device. The EPAP device creates positive air pressure as you exhale. The device consists of single-use valves, which are inserted into each nostril and held in place by adhesive. The valves create very little resistance when you inhale but create much more resistance when you exhale. That increased resistance creates the positive airway pressure. This positive pressure while you exhale keeps your airway open, making it easier to breath when you inhale again.  Bilevel positive airway pressure (BPAP) device. The BPAP device is used mainly in patients with central sleep apnea. This device is similar to the CPAP device because it also uses an air pump to deliver continuous air pressure through a mask. However, with the BPAP machine, the pressure is set at two different levels. The pressure when you exhale is lower than the pressure when you inhale.  Surgery. Typically, surgery is only done if you cannot comply with less invasive treatments or if the less invasive treatments do not improve your condition. Surgery involves removing excess tissue in your airway to create a wider passage way. Document Released: 11/14/2002 Document Revised: 03/21/2013 Document Reviewed: 04/01/2012 [facility] Patient Information 2015 [place], [place]. This information is not intended to replace advice given to you by your health care provider. Make sure you discuss any  questions you have with your health care provider.

## 2015-03-10 LAB — VITAMIN D 25 HYDROXY (VIT D DEFICIENCY, FRACTURES): Vit D, 25-Hydroxy: 10 ng/mL — ABNORMAL LOW (ref 30–100)

## 2015-03-10 NOTE — Telephone Encounter (Signed)
Saw pt in clinic yest and discussed

## 2015-03-13 ENCOUNTER — Encounter: Payer: Self-pay | Admitting: Family Medicine

## 2015-03-15 ENCOUNTER — Telehealth: Payer: Self-pay

## 2015-03-15 DIAGNOSIS — E559 Vitamin D deficiency, unspecified: Secondary | ICD-10-CM | POA: Insufficient documentation

## 2015-03-15 DIAGNOSIS — E039 Hypothyroidism, unspecified: Secondary | ICD-10-CM | POA: Insufficient documentation

## 2015-03-15 MED ORDER — ERGOCALCIFEROL 1.25 MG (50000 UT) PO CAPS: 50000.0000 [IU] | ORAL_CAPSULE | ORAL | Status: DC

## 2015-03-15 NOTE — Telephone Encounter (Signed)
Yes - absolutely agree - pt was referred to endocrinology prev and referral was sent to [facility] but pt declined to go there - would like to see Dr. Chalmers Cater endocrinology at [facility] for thyroid disorder. Please send results of thyroid biopsy, [place], and last several labs w/ referral, thanks. Pam Mckee

## 2015-03-15 NOTE — Telephone Encounter (Signed)
PT WOULD LIKE A REFERRAL TO DR Chalmers Cater AND LET HER KNOW SLEEP STUDY REFERRAL IS IN PROGRESS

## 2015-03-15 NOTE — Telephone Encounter (Signed)
Dr Brigitte Pulse-- Pt called in and wants a referral to dr balan.

## 2015-03-20 ENCOUNTER — Encounter: Payer: Self-pay | Admitting: Family Medicine

## 2015-04-08 DIAGNOSIS — S83249A Other tear of medial meniscus, current injury, unspecified knee, initial encounter: Secondary | ICD-10-CM

## 2015-04-08 HISTORY — DX: Other tear of medial meniscus, current injury, unspecified knee, initial encounter: S83.249A

## 2015-04-11 ENCOUNTER — Encounter: Payer: Self-pay | Admitting: Family Medicine

## 2015-04-20 ENCOUNTER — Ambulatory Visit (INDEPENDENT_AMBULATORY_CARE_PROVIDER_SITE_OTHER): Payer: 59 | Admitting: Neurology

## 2015-04-20 ENCOUNTER — Encounter: Payer: Self-pay | Admitting: Neurology

## 2015-04-20 VITALS — BP 146/98 | HR 72 | Resp 16 | Ht 66.5 in | Wt 275.0 lb

## 2015-04-20 DIAGNOSIS — G4733 Obstructive sleep apnea (adult) (pediatric): Secondary | ICD-10-CM

## 2015-04-20 DIAGNOSIS — R51 Headache: Secondary | ICD-10-CM

## 2015-04-20 DIAGNOSIS — R351 Nocturia: Secondary | ICD-10-CM

## 2015-04-20 DIAGNOSIS — R519 Headache, unspecified: Secondary | ICD-10-CM

## 2015-04-20 DIAGNOSIS — G478 Other sleep disorders: Secondary | ICD-10-CM

## 2015-04-20 NOTE — Patient Instructions (Addendum)

## 2015-04-20 NOTE — Progress Notes (Signed)
Subjective:    [name] is a 44 y.o. female.  HPI     Star Age, MD, PhD [facility] [address], [address] [place], [place] [address]  Dear Dr. Brigitte Pulse,   I saw your Mckee, Pam Mckee, upon your kind request in my neurologic clinic today for initial consultation of her sleep disorder, in particular, concern for underlying obstructive sleep apnea. Pam Mckee is unaccompanied today. As you know, Pam Mckee is a very friendly 44 year old right-handed woman with an underlying medical history of hypothyroidism, hypertension, seasonal allergies, vitamin D deficiency, and severe obesity, who reports snoring, witnessed apneic pauses while asleep, waking up with a gasping sensation and a startle, and nonrestorative sleep. Her brother has sleep apnea and is on a CPAP machine. She has rare morning headaches. She tries to get enough sleep. On weekends she tries to catch up and likes to take a nap. She works full-time at [employer]. Her bedtime is around 11 PM and she usually falls asleep quickly. She does not tend to watch TV in bed but does use her cell phone to watch a movie sometimes her play on it for a little bit. Pam Mckee time is 6 AM. She has nocturia once typically around 4 AM. She denies any significant restless leg symptoms. She is not known to twitch or tic in her sleep. She lives with her boyfriend. He has witnessed apneic pauses in her sleep. She has one son age 54 who is currently home from school. She has no pets. She has in Pam recent past been able to lose about 30 pounds but it is slowly creeping back up. She has an appointment with her new endocrinologist for thyroid disorder on 05/08/2015. She drinks caffeine in Pam form of coffee typically 2 cups per day and maybe 1 soda. Sometimes she exchanges Pam coffee for tea. She is a nonsmoker and does not drink alcohol. She denies any parasomnias except for rare sleep talking. When  she first wakes up she is often not well rested. She denies frank severe sleepiness. Her Epworth sleepiness score is 4 out of 24 and fatigue score is 13 out of 63 today. She has recently noticed ankle puffiness.  Her Past Medical History Is Significant For: Past Medical History  Diagnosis Date  . Seizures   . Thyroid disease     hypo  . Hypertension     Her Past Surgical History Is Significant For: No past surgical history on file.  Her Family History Is Significant For: Family History  Problem Relation Age of Onset  . Hypertension Mother   . Hypertension Brother   . Diabetes Paternal Grandfather   . Renal Disease Father     Her Social History Is Significant For: History   Social History  . Marital Status: Single    Spouse Name: N/A  . Number of Children: 1  . Years of Education: HS   Occupational History  . Pharmacy technican    Social History Main Topics  . Smoking status: Never Smoker   . Smokeless tobacco: Not on file  . Alcohol Use: No  . Drug Use: No  . Sexual Activity: Yes    Birth Control/ Protection: Condom   Other Topics Concern  . None   Social History Narrative   Exercise 3 times/week Zumba for 1 hour   Drinks about 2-3 cups of coffee a day    Her Allergies Are:  No Known Allergies:  Her Current Medications Are:  Outpatient Encounter Prescriptions as of 04/20/2015  Medication Sig  . cetirizine (ZYRTEC) 10 MG tablet Take 1 tablet (10 mg total) by mouth at bedtime.  . ergocalciferol (VITAMIN D2) 50000 UNITS capsule Take 1 capsule (50,000 Units total) by mouth once a week.  . hydrochlorothiazide (HYDRODIURIL) 25 MG tablet Take 1 tablet (25 mg total) by mouth daily.  Marland Kitchen levothyroxine (SYNTHROID, LEVOTHROID) 50 MCG tablet Take 1 tablet (50 mcg total) by mouth daily before breakfast.  . losartan (COZAAR) 100 MG tablet Take 1 tablet (100 mg total) by mouth daily.  . naproxen sodium (ANAPROX) 550 MG tablet Take 1 tablet (550 mg total) by mouth 2 (two)  times daily as needed (for pain with food).   No facility-administered encounter medications on file as of 04/20/2015.  :  Review of Systems:  Out of a complete 14 point review of systems, all are reviewed and negative with Pam exception of these symptoms as listed below:   Review of Systems  Respiratory: Positive for shortness of breath and wheezing.   Neurological:       Snoring, witnessed apnea, hum during sleep, wakes up rested in Pam morning, takes naps when not working, denies daytime tiredness     Objective:  Neurologic Exam  Physical Exam Physical Examination:   Filed Vitals:   04/20/15 0820  BP: 146/98  Pulse: 72  Resp: 16    General Examination: Pam Mckee is a very Pam 45 y.o. female in no acute distress. She appears well-developed and well-nourished and well groomed. She is obese.  HEENT: Normocephalic, atraumatic, pupils are equal, round and reactive to light and accommodation. Funduscopic exam is normal with sharp disc margins noted. Extraocular tracking is good without limitation to gaze excursion or nystagmus noted. Normal smooth pursuit is noted. Hearing is grossly intact. Tympanic membranes are clear bilaterally. Face is symmetric with normal facial animation and normal facial sensation. Speech is clear with no dysarthria noted. There is no hypophonia. There is no lip, neck/head, jaw or voice tremor. Neck is supple with full range of passive and active motion. There are no carotid bruits on auscultation. Oropharynx exam reveals: mild mouth dryness, good dental hygiene and moderate airway crowding, due to tonsillar size of 2+ bilaterally. Tonsils are cryptic in appearance. In addition, she has a narrow appearing airway entry. Mallampati is class II. Tongue protrudes centrally and palate elevates symmetrically. Neck size is 17 inches. She has a Mild overbite. Nasal inspection reveals no significant nasal mucosal bogginess or redness and no septal deviation.    Chest: Clear to auscultation without wheezing, rhonchi or crackles noted.  Heart: S1+S2+0, regular and normal without murmurs, rubs or gallops noted.   Abdomen: Soft, non-tender and non-distended with normal bowel sounds appreciated on auscultation.  Extremities: There is no pitting edema in Pam distal lower extremities bilaterally. Pedal pulses are intact.  Skin: Warm and dry without trophic changes noted. There are no varicose veins.  Musculoskeletal: exam reveals no obvious joint deformities, tenderness or joint swelling or erythema.   Neurologically:  Mental status: Pam Mckee is awake, alert and oriented in all 4 spheres. Her immediate and remote memory, attention, language skills and fund of knowledge are appropriate. There is no evidence of aphasia, agnosia, apraxia or anomia. Speech is clear with normal prosody and enunciation. Thought process is linear. Mood is normal and affect is normal.  Cranial nerves II - XII are as described above under HEENT exam. In addition: shoulder shrug is  normal with equal shoulder height noted. Motor exam: Normal bulk, strength and tone is noted. There is no drift, tremor or rebound. Romberg is negative. Reflexes are 2+ throughout. Babinski: Toes are flexor bilaterally. Fine motor skills and coordination: intact with normal finger taps, normal hand movements, normal rapid alternating patting, normal foot taps and normal foot agility.  Cerebellar testing: No dysmetria or intention tremor on finger to nose testing. Heel to shin is slightly difficult for her bilaterally, secondary to body habitus. There is no truncal or gait ataxia.  Sensory exam: intact to light touch, pinprick, vibration, temperature sense in Pam upper and lower extremities.  Gait, station and balance: She stands easily. No veering to one side is noted. No leaning to one side is noted. Posture is age-appropriate and stance is narrow based. Gait shows normal stride length and normal pace.  No problems turning are noted. She turns en bloc. Tandem walk is unremarkable.   Assessment and Plan:   In summary, Pam Mckee is a very Pam 44 y.o.-year old female with an underlying medical history of hypothyroidism, hypertension, seasonal allergies, vitamin D deficiency, and severe obesity, whose history and physical exam are in keeping with obstructive sleep apnea (OSA). I had a long chat with Pam Mckee about my findings and Pam diagnosis of OSA, its prognosis and treatment options. We talked about medical treatments, surgical interventions and non-pharmacological approaches. I explained in particular Pam risks and ramifications of untreated moderate to severe OSA, especially with respect to developing cardiovascular disease down [place] [address], including congestive heart failure, difficult to treat hypertension, cardiac arrhythmias, or stroke. Even type 2 diabetes has, in part, been linked to untreated OSA. Symptoms of untreated OSA include daytime sleepiness, memory problems, mood irritability and mood disorder such as depression and anxiety, lack of energy, as well as recurrent headaches, especially morning headaches. We talked about trying to maintain a healthy lifestyle in general, as well as Pam importance of weight control. I encouraged Pam Mckee to eat healthy, exercise daily and keep well hydrated, to keep a scheduled bedtime and wake time routine, to not skip any meals and eat healthy snacks in between meals. I advised Pam Mckee not to drive when feeling sleepy. I recommended Pam following at this time: sleep study with potential positive airway pressure titration. (We will score hypopneas at 3% and split Pam sleep study into diagnostic and treatment portion, if Pam estimated. 2 hour AHI is >15/h).   I explained Pam sleep test procedure to Pam Mckee and also outlined possible surgical and non-surgical treatment options of OSA, including Pam use of a custom-made dental device (which  would require a referral to a specialist dentist or oral surgeon), upper airway surgical options, such as pillar implants, radiofrequency surgery, tongue base surgery, and UPPP (which would involve a referral to an ENT surgeon). Rarely, jaw surgery such as mandibular advancement may be considered.  I also explained Pam CPAP treatment option to Pam Mckee, who indicated that she would be willing to try CPAP if Pam need arises. I explained Pam importance of being compliant with PAP treatment, not only for insurance purposes but primarily to improve Her symptoms, and for Pam Mckee's long term health benefit, including to reduce Her cardiovascular risks. I answered all her questions today and Pam Mckee was in agreement. I would like to see her back after Pam sleep study is completed and encouraged her to call with any interim questions, concerns, problems or updates.   Thank you very  much for allowing me to participate in Pam care of this nice Mckee. If I can be of any further assistance to you please do not hesitate to call me at [phone].  Sincerely,   Star Age, MD, PhD

## 2015-04-23 ENCOUNTER — Other Ambulatory Visit: Payer: Self-pay

## 2015-04-23 DIAGNOSIS — M25562 Pain in left knee: Secondary | ICD-10-CM

## 2015-04-23 DIAGNOSIS — G8929 Other chronic pain: Secondary | ICD-10-CM | POA: Insufficient documentation

## 2015-04-23 MED ORDER — LEVOTHYROXINE SODIUM 50 MCG PO TABS: 50.0000 ug | ORAL_TABLET | Freq: Every day | ORAL | Status: DC

## 2015-04-30 ENCOUNTER — Ambulatory Visit (INDEPENDENT_AMBULATORY_CARE_PROVIDER_SITE_OTHER): Payer: 59 | Admitting: Family Medicine

## 2015-04-30 ENCOUNTER — Ambulatory Visit (INDEPENDENT_AMBULATORY_CARE_PROVIDER_SITE_OTHER): Payer: 59

## 2015-04-30 VITALS — BP 124/82 | HR 90 | Temp 98.0°F | Resp 16 | Ht 67.5 in | Wt 282.0 lb

## 2015-04-30 DIAGNOSIS — M25562 Pain in left knee: Secondary | ICD-10-CM | POA: Diagnosis not present

## 2015-04-30 DIAGNOSIS — R519 Headache, unspecified: Secondary | ICD-10-CM

## 2015-04-30 DIAGNOSIS — M79672 Pain in left foot: Secondary | ICD-10-CM

## 2015-04-30 DIAGNOSIS — M722 Plantar fascial fibromatosis: Secondary | ICD-10-CM | POA: Diagnosis not present

## 2015-04-30 DIAGNOSIS — R51 Headache: Secondary | ICD-10-CM

## 2015-04-30 MED ORDER — BUTALBITAL-APAP-CAFFEINE 50-325-40 MG PO TABS: 1.0000 | ORAL_TABLET | Freq: Four times a day (QID) | ORAL | Status: DC | PRN

## 2015-04-30 MED ORDER — METAXALONE 800 MG PO TABS: 800.0000 mg | ORAL_TABLET | Freq: Three times a day (TID) | ORAL | Status: DC

## 2015-04-30 MED ORDER — DICLOFENAC SODIUM 75 MG PO TBEC: 75.0000 mg | DELAYED_RELEASE_TABLET | Freq: Two times a day (BID) | ORAL | Status: DC

## 2015-04-30 NOTE — Progress Notes (Signed)
Subjective: 44 year old lady who's been having problems with headaches over the last few days. For 2 days she has been hurting in the left temporal area. That is unusual for her. Nose no trauma. No visual aura. No other onset..  She last week, had a pop in her left knee. It been hurting her that day. She is swollen some from the knee down to the foot. She gone home and was aware of the single audible pop. The knee is not hurting as much today. However she along with that is been having pain in her left heel. Standing on the left heel is hurting quite a lot today. 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 Does not do any regular physical exercise.No specific trauma.  Objective: Overweight lady in no major distress. TMs normal. Eyes PERRLA. Fundi benign. Throat clear. Neck supple without nodes or thyromegaly. No carotid bruits. Chest clear. Heart regular without murmur. Left knee is not swollen. No crepitance. Good range of motion without pain. The left heel is tender anteriorly.  Assessment: Left headache Left knee pain and pop Left heel pain, consistent with plantar fasciitis  Plan: X-ray left knee and left heel Treat headache symptomatically  [facility] reading (PRIMARY) by  Dr. Linna Darner Normal knee Calcaneal spurs  Read with diclofenac for the heel and instructed her on stretches.

## 2015-04-30 NOTE — Patient Instructions (Signed)
Take diclofenac one twice daily for heel and knee problems. Take with food.  Take the Fioricet 1 every 4-6 hours as needed for headache. Can take 2 in the event of a severe headache.  Skelaxin also one every 8 hours if needed for the headache. It is a muscle relaxant helps these kind of headaches often.  Stretch the heel as instructed  Keeps bothering you or the heel keeps bothering you we may need to proceed onto some other studies or referrals.  Plantar Fasciitis Plantar fasciitis is a common condition that causes foot pain. It is soreness (inflammation) of the band of tough fibrous tissue on the bottom of the foot that runs from the heel bone (calcaneus) to the ball of the foot. The cause of this soreness may be from excessive standing, poor fitting shoes, running on hard surfaces, being overweight, having an abnormal walk, or overuse (this is common in runners) of the painful foot or feet. It is also common in aerobic exercise dancers and ballet dancers. SYMPTOMS  Most people with plantar fasciitis complain of:  Severe pain in the morning on the bottom of their foot especially when taking the first steps out of bed. This pain recedes after a few minutes of walking.  Severe pain is experienced also during walking following a long period of inactivity.  Pain is worse when walking barefoot or up stairs DIAGNOSIS   Your caregiver will diagnose this condition by examining and feeling your foot.  Special tests such as X-rays of your foot, are usually not needed. PREVENTION   Consult a sports medicine professional before beginning a new exercise program.  Walking programs offer a good workout. With walking there is a lower chance of overuse injuries common to runners. There is less impact and less jarring of the joints.  Begin all new exercise programs slowly. If problems or pain develop, decrease the amount of time or distance until you are at a comfortable level.  Wear good shoes and  replace them regularly.  Stretch your foot and the heel cords at the back of the ankle (Achilles tendon) both before and after exercise.  Run or exercise on even surfaces that are not hard. For example, asphalt is better than pavement.  Do not run barefoot on hard surfaces.  If using a treadmill, vary the incline.  Do not continue to workout if you have foot or joint problems. Seek professional help if they do not improve. HOME CARE INSTRUCTIONS   Avoid activities that cause you pain until you recover.  Use ice or cold packs on the problem or painful areas after working out.  Only take over-the-counter or prescription medicines for pain, discomfort, or fever as directed by your caregiver.  Soft shoe inserts or athletic shoes with air or gel sole cushions may be helpful.  If problems continue or become more severe, consult a sports medicine caregiver or your own health care provider. Cortisone is a potent anti-inflammatory medication that may be injected into the painful area. You can discuss this treatment with your caregiver. MAKE SURE YOU:   Understand these instructions.  Will watch your condition.  Will get help right away if you are not doing well or get worse. Document Released: 08/19/2001 Document Revised: 02/16/2012 Document Reviewed: 10/18/2008 [facility] Patient Information 2015 [place], [place]. This information is not intended to replace advice given to you by your health care provider. Make sure you discuss any questions you have with your health care provider.

## 2015-05-08 ENCOUNTER — Other Ambulatory Visit: Payer: Self-pay | Admitting: Endocrinology

## 2015-05-08 DIAGNOSIS — E041 Nontoxic single thyroid nodule: Secondary | ICD-10-CM

## 2015-05-10 ENCOUNTER — Ambulatory Visit (INDEPENDENT_AMBULATORY_CARE_PROVIDER_SITE_OTHER): Payer: 59 | Admitting: Neurology

## 2015-05-10 ENCOUNTER — Encounter: Payer: 59 | Attending: Family Medicine | Admitting: Dietician

## 2015-05-10 ENCOUNTER — Encounter: Payer: Self-pay | Admitting: Dietician

## 2015-05-10 VITALS — BP 141/81 | HR 74 | Ht 66.5 in | Wt 275.0 lb

## 2015-05-10 DIAGNOSIS — G4733 Obstructive sleep apnea (adult) (pediatric): Secondary | ICD-10-CM | POA: Diagnosis not present

## 2015-05-10 DIAGNOSIS — Z713 Dietary counseling and surveillance: Secondary | ICD-10-CM | POA: Insufficient documentation

## 2015-05-10 DIAGNOSIS — Z6841 Body Mass Index (BMI) 40.0 and over, adult: Secondary | ICD-10-CM | POA: Insufficient documentation

## 2015-05-10 DIAGNOSIS — G4761 Periodic limb movement disorder: Secondary | ICD-10-CM

## 2015-05-10 DIAGNOSIS — G479 Sleep disorder, unspecified: Secondary | ICD-10-CM

## 2015-05-10 DIAGNOSIS — R9431 Abnormal electrocardiogram [ECG] [EKG]: Secondary | ICD-10-CM

## 2015-05-10 NOTE — Patient Instructions (Addendum)
If you want to check out the bariatric seminar: [employer]oring[email or web address] Aim to eat 3 meals per day and 2 snacks if you are hungry. For breakfast, try an egg with cereal or fruit . Have protein with carbs for snacks (see list). Have packed with you for work.  Plan to pack lunch 4 x week - sandwich with some baby carrots or other vegetables. At dinner, limit starch to a quarter of your plate, have lean protein the size of the palm of your hand, and fill half of your plate with vegetables. Plan to eat dinner at the table with no TV.  Plan to be consistent with walking, goal of 4-5 x week.

## 2015-05-10 NOTE — Sleep Study (Signed)
See scanned documents in Encounters tab

## 2015-05-10 NOTE — Progress Notes (Signed)
  Medical Nutrition Therapy:  Appt start time: 1423 end time:  1720.   Assessment:  Primary concerns today: Pam Mckee is here today for the Live Life Well program for [facility]. Did Weight Watchers last year which went ok. She lost 30 lbs last year on her own by eating more salads and smaller portions. Has gained back 15 lbs since November. Started a relationship in November which caused her to change her eating habits. May be interested in RYGB but is nervous about that. First goal is to see 250 lbs. Highest weight was 298 lbs last year.  Lives with her and boyfriend and her son will be home over the summer. She does the food shopping and meal preparation at home. Tends to "over shop". Eating out sometimes for lunch and 2-3 x week for dinner. Will sometimes skip breakfast.   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   Not feeling too hungry throughout the day.   Feels guilty when she eats "bad".    Preferred Learning Style:   No preference indicated   Learning Readiness:   Ready  MEDICATIONS: see list   DIETARY INTAKE:  Usual eating pattern includes 2 meals and 1-2 snacks per day.  Avoided foods include: none   24-hr recall:  B ( AM): coffee or hot tea with flavored creamer  Snk ( AM): none or danish   L ( PM): sandwich with chips  Snk ( PM): nutty bar or mints D ( PM): rice, vegetable, and chicken or other meat Snk ( PM): once in awhile will have something sweet Beverages: 56 oz water, coffee, tea  Usual physical activity: 20 minute walk 3 x week   Estimated energy needs: 1800 calories 200 g carbohydrates 135 g protein 50 g fat  Progress Towards Goal(s):  In progress.   Nutritional Diagnosis:  [place]-3.3 Overweight/obesity As related to hx of large portions and meal skipping .  As evidenced by BMI of 44.8.    Intervention:  Nutrition counseling provided. Plan: If you want to check out the bariatric seminar:  [email or web address] Aim to eat 3 meals per day and 2 snacks if you are hungry. For breakfast, try an egg with cereal or fruit . Have protein with carbs for snacks (see list). Have packed with you for work.  Plan to pack lunch 4 x week - sandwich with some baby carrots or other vegetables. At dinner, limit starch to a quarter of your plate, have lean protein the size of the palm of your hand, and fill half of your plate with vegetables. Plan to eat dinner at the table with no TV.  Plan to be consistent with walking, goal of 4-5 x week.   Teaching Method Utilized:  Visual Auditory Hands on  Handouts given during visit include:  MyPlate Handout  15 g CHO Snacks  Yellow Meal Card   Barriers to learning/adherence to lifestyle change: none  Demonstrated degree of understanding via:  Teach Back   Monitoring/Evaluation:  Dietary intake, exercise, and body weight in 1 month(s).

## 2015-05-15 ENCOUNTER — Telehealth: Payer: Self-pay

## 2015-05-15 NOTE — Telephone Encounter (Signed)
Pt was seen on 5/23 by Dr. Linna Darner and had an x-ray done on her knee. She would like a CB concerning the results of this. She is also wondering if she could get a knee brace. Please advise at [phone]

## 2015-05-16 ENCOUNTER — Telehealth: Payer: Self-pay | Admitting: Neurology

## 2015-05-16 DIAGNOSIS — G4733 Obstructive sleep apnea (adult) (pediatric): Secondary | ICD-10-CM

## 2015-05-16 NOTE — Telephone Encounter (Signed)
Pam Mckee:   Please call and notify patient that the recent sleep study confirmed the diagnosis of severe OSA. She did well with CPAP during the study with improvement of the respiratory events. Therefore, I would like start the patient on CPAP therapy at home by prescribing a machine for home use. I placed the order in the chart. The patient will need a follow up appointment with me in 8 to 10 weeks post set up that has to be scheduled; please go ahead and schedule while you have the patient on the phone and make sure patient understands the importance of keeping this window for the FU appointment, as it is often an insurance requirement and failing to adhere to this may result in losing coverage for sleep apnea treatment. 15 min follow-up should suffice, unless there is a 30 min FU slot available.  Please re-enforce the importance of compliance with treatment and the need for [place] to monitor compliance data - again an insurance requirement and good feedback for the patient as far as how they are doing.  Also remind patient, that any upcoming CPAP machine or mask issues, should be first addressed with the DME company. Please ask if patient has a preference regarding DME company.  Please arrange for CPAP set up at home through a DME company of patient's choice - once you have spoken to the patient - and faxed/routed report to PCP and referring MD (if other than PCP), you can close this encounter, thanks,   Star Age, MD, PhD [facility] ([place])

## 2015-05-16 NOTE — Telephone Encounter (Signed)
Dr. Linna Darner, please review.

## 2015-05-17 NOTE — Telephone Encounter (Signed)
Called cell number..no answer and no vm.   Called work number...  I spoke to patient. She is aware of results and recommendation. She would like to proceed with CPAP treatment. She has UMR and lives in [place]. She set f/u appt for 8/11. I will fax report to referring doctor and send letter to patient letting her know who we referred her, reminder of appt, and the importance of compliance.

## 2015-05-18 ENCOUNTER — Ambulatory Visit (INDEPENDENT_AMBULATORY_CARE_PROVIDER_SITE_OTHER): Payer: 59 | Admitting: Sports Medicine

## 2015-05-18 ENCOUNTER — Encounter: Payer: Self-pay | Admitting: Sports Medicine

## 2015-05-18 ENCOUNTER — Ambulatory Visit (INDEPENDENT_AMBULATORY_CARE_PROVIDER_SITE_OTHER): Payer: 59 | Admitting: Family Medicine

## 2015-05-18 ENCOUNTER — Encounter: Payer: Self-pay | Admitting: Family Medicine

## 2015-05-18 VITALS — BP 122/78 | Ht 67.0 in | Wt 270.0 lb

## 2015-05-18 VITALS — BP 130/90 | HR 67 | Temp 98.3°F | Resp 16 | Ht 66.5 in | Wt 277.2 lb

## 2015-05-18 DIAGNOSIS — E033 Postinfectious hypothyroidism: Secondary | ICD-10-CM | POA: Diagnosis not present

## 2015-05-18 DIAGNOSIS — M5442 Lumbago with sciatica, left side: Secondary | ICD-10-CM

## 2015-05-18 DIAGNOSIS — M25562 Pain in left knee: Secondary | ICD-10-CM | POA: Diagnosis not present

## 2015-05-18 DIAGNOSIS — R928 Other abnormal and inconclusive findings on diagnostic imaging of breast: Secondary | ICD-10-CM

## 2015-05-18 DIAGNOSIS — D649 Anemia, unspecified: Secondary | ICD-10-CM

## 2015-05-18 DIAGNOSIS — Z113 Encounter for screening for infections with a predominantly sexual mode of transmission: Secondary | ICD-10-CM | POA: Diagnosis not present

## 2015-05-18 DIAGNOSIS — M1712 Unilateral primary osteoarthritis, left knee: Secondary | ICD-10-CM

## 2015-05-18 DIAGNOSIS — I10 Essential (primary) hypertension: Secondary | ICD-10-CM

## 2015-05-18 DIAGNOSIS — E559 Vitamin D deficiency, unspecified: Secondary | ICD-10-CM | POA: Diagnosis not present

## 2015-05-18 DIAGNOSIS — N920 Excessive and frequent menstruation with regular cycle: Secondary | ICD-10-CM | POA: Diagnosis not present

## 2015-05-18 DIAGNOSIS — M7150 Other bursitis, not elsewhere classified, unspecified site: Secondary | ICD-10-CM | POA: Diagnosis not present

## 2015-05-18 DIAGNOSIS — G4733 Obstructive sleep apnea (adult) (pediatric): Secondary | ICD-10-CM | POA: Diagnosis not present

## 2015-05-18 DIAGNOSIS — B9689 Other specified bacterial agents as the cause of diseases classified elsewhere: Secondary | ICD-10-CM

## 2015-05-18 DIAGNOSIS — K5900 Constipation, unspecified: Secondary | ICD-10-CM

## 2015-05-18 DIAGNOSIS — Z0000 Encounter for general adult medical examination without abnormal findings: Secondary | ICD-10-CM | POA: Diagnosis not present

## 2015-05-18 DIAGNOSIS — A499 Bacterial infection, unspecified: Secondary | ICD-10-CM

## 2015-05-18 DIAGNOSIS — B3731 Acute candidiasis of vulva and vagina: Secondary | ICD-10-CM

## 2015-05-18 DIAGNOSIS — N760 Acute vaginitis: Secondary | ICD-10-CM

## 2015-05-18 DIAGNOSIS — Z124 Encounter for screening for malignant neoplasm of cervix: Secondary | ICD-10-CM

## 2015-05-18 DIAGNOSIS — R921 Mammographic calcification found on diagnostic imaging of breast: Secondary | ICD-10-CM | POA: Insufficient documentation

## 2015-05-18 DIAGNOSIS — M7050 Other bursitis of knee, unspecified knee: Secondary | ICD-10-CM

## 2015-05-18 DIAGNOSIS — B373 Candidiasis of vulva and vagina: Secondary | ICD-10-CM

## 2015-05-18 HISTORY — DX: Unilateral primary osteoarthritis, left knee: M17.12

## 2015-05-18 LAB — CBC
HCT: 34.7 % — ABNORMAL LOW (ref 36.0–46.0)
Hemoglobin: 10.9 g/dL — ABNORMAL LOW (ref 12.0–15.0)
MCH: 22.7 pg — ABNORMAL LOW (ref 26.0–34.0)
MCHC: 31.4 g/dL (ref 30.0–36.0)
MCV: 72.3 fL — ABNORMAL LOW (ref 78.0–100.0)
MPV: 10 fL (ref 8.6–12.4)
Platelets: 312 10*3/uL (ref 150–400)
RBC: 4.8 MIL/uL (ref 3.87–5.11)
RDW: 16.6 % — ABNORMAL HIGH (ref 11.5–15.5)
WBC: 8 10*3/uL (ref 4.0–10.5)

## 2015-05-18 LAB — HEPATITIS C ANTIBODY: HCV Ab: NEGATIVE

## 2015-05-18 LAB — HIV ANTIBODY (ROUTINE TESTING W REFLEX): HIV 1&2 Ab, 4th Generation: NONREACTIVE

## 2015-05-18 LAB — FERRITIN: Ferritin: 22 ng/mL (ref 10–291)

## 2015-05-18 LAB — POCT WET PREP WITH KOH
KOH Prep POC: POSITIVE
Trichomonas, UA: NEGATIVE
Yeast Wet Prep HPF POC: NEGATIVE

## 2015-05-18 LAB — POCT URINALYSIS DIPSTICK
Bilirubin, UA: NEGATIVE
Blood, UA: NEGATIVE
Glucose, UA: NEGATIVE
Ketones, UA: NEGATIVE
Leukocytes, UA: NEGATIVE
Nitrite, UA: NEGATIVE
Protein, UA: NEGATIVE
Spec Grav, UA: 1.015
Urobilinogen, UA: 0.2
pH, UA: 7.5

## 2015-05-18 LAB — RPR

## 2015-05-18 LAB — IBC PANEL
%SAT: 10 % — ABNORMAL LOW (ref 20–55)
TIBC: 395 ug/dL (ref 250–470)
UIBC: 357 ug/dL (ref 125–400)

## 2015-05-18 LAB — IRON: Iron: 38 ug/dL — ABNORMAL LOW (ref 42–145)

## 2015-05-18 MED ORDER — FLUCONAZOLE 150 MG PO TABS: 150.0000 mg | ORAL_TABLET | Freq: Once | ORAL | Status: DC

## 2015-05-18 MED ORDER — DICLOFENAC SODIUM 3 % TD GEL: TRANSDERMAL | Status: DC

## 2015-05-18 MED ORDER — METRONIDAZOLE 500 MG PO TABS: 500.0000 mg | ORAL_TABLET | Freq: Two times a day (BID) | ORAL | Status: DC

## 2015-05-18 MED ORDER — METHYLPREDNISOLONE ACETATE 40 MG/ML IJ SUSP
40.0000 mg | Freq: Once | INTRAMUSCULAR | Status: AC
  Administered 2015-05-18: 40 mg via INTRA_ARTICULAR

## 2015-05-18 MED ORDER — INDOMETHACIN 50 MG PO CAPS: 50.0000 mg | ORAL_CAPSULE | Freq: Three times a day (TID) | ORAL | Status: DC | PRN

## 2015-05-18 NOTE — Telephone Encounter (Signed)
Has since seen Dr. Brigitte Pulse and sports medicine.

## 2015-05-18 NOTE — Patient Instructions (Addendum)
Bacterial Vaginosis Bacterial vaginosis is a vaginal infection that occurs when the normal balance of bacteria in the vagina is disrupted. It results from an overgrowth of certain bacteria. This is the most common vaginal infection in women of childbearing age. Treatment is important to prevent complications, especially in pregnant women, as it can cause a premature delivery. CAUSES  Bacterial vaginosis is caused by an increase in harmful bacteria that are normally present in smaller amounts in the vagina. Several different kinds of bacteria can cause bacterial vaginosis. However, the reason that the condition develops is not fully understood. RISK FACTORS Certain activities or behaviors can put you at an increased risk of developing bacterial vaginosis, including:  Having a new sex partner or multiple sex partners.  Douching.  Using an intrauterine device (IUD) for contraception. Women do not get bacterial vaginosis from toilet seats, bedding, swimming pools, or contact with objects around them. SIGNS AND SYMPTOMS  Some women with bacterial vaginosis have no signs or symptoms. Common symptoms include:  Grey vaginal discharge.  A fishlike odor with discharge, especially after sexual intercourse.  Itching or burning of the vagina and vulva.  Burning or pain with urination. DIAGNOSIS  Your health care provider will take a medical history and examine the vagina for signs of bacterial vaginosis. A sample of vaginal fluid may be taken. Your health care provider will look at this sample under a microscope to check for bacteria and abnormal cells. A vaginal pH test may also be done.  TREATMENT  Bacterial vaginosis may be treated with antibiotic medicines. These may be given in the form of a pill or a vaginal cream. A second round of antibiotics may be prescribed if the condition comes back after treatment.  HOME CARE INSTRUCTIONS   Only take over-the-counter or prescription medicines as  directed by your health care provider.  If antibiotic medicine was prescribed, take it as directed. Make sure you finish it even if you start to feel better.  Do not have sex until treatment is completed.  Tell all sexual partners that you have a vaginal infection. They should see their health care provider and be treated if they have problems, such as a mild rash or itching.  Practice safe sex by using condoms and only having one sex partner. SEEK MEDICAL CARE IF:   Your symptoms are not improving after 3 days of treatment.  You have increased discharge or pain.  You have a fever. MAKE SURE YOU:   Understand these instructions.  Will watch your condition.  Will get help right away if you are not doing well or get worse. FOR MORE INFORMATION  Centers for Disease Control and Prevention, Division of STD Prevention: [employer] American Sexual Health Association (ASHA): www.ashastd.org  Document Released: 11/24/2005 Document Revised: 09/14/2013 Document Reviewed: 07/06/2013 [facility] Patient Information 2015 [employer], [place]. This information is not intended to replace advice given to you by your health care provider. Make sure you discuss any questions you have with your health care provider.   Health Maintenance Adopting a healthy lifestyle and getting preventive care can go a long way to promote health and wellness. Talk with your health care provider about what schedule of regular examinations is right for you. This is a good chance for you to check in with your provider about disease prevention and staying healthy. In between checkups, there are plenty of things you can do on your own. Experts have done a lot of research about which lifestyle changes and preventive measures  are most likely to keep you healthy. Ask your health care provider for more information. WEIGHT AND DIET  Eat a healthy diet  Be sure to include plenty of vegetables, fruits, low-fat dairy products, and  lean protein.  Do not eat a lot of foods high in solid fats, added sugars, or salt.  Get regular exercise. This is one of the most important things you can do for your health.  Most adults should exercise for at least 150 minutes each week. The exercise should increase your heart rate and make you sweat (moderate-intensity exercise).  Most adults should also do strengthening exercises at least twice a week. This is in addition to the moderate-intensity exercise.  Maintain a healthy weight  Body mass index (BMI) is a measurement that can be used to identify possible weight problems. It estimates body fat based on height and weight. Your health care provider can help determine your BMI and help you achieve or maintain a healthy weight.  For females 31 years of age and older:   A BMI below 18.5 is considered underweight.  A BMI of 18.5 to 24.9 is normal.  A BMI of 25 to 29.9 is considered overweight.  A BMI of 30 and above is considered obese.  Watch levels of cholesterol and blood lipids  You should start having your blood tested for lipids and cholesterol at 44 years of age, then have this test every 5 years.  You may need to have your cholesterol levels checked more often if:  Your lipid or cholesterol levels are high.  You are older than 43 years of age.  You are at high risk for heart disease.  CANCER SCREENING   Lung Cancer  Lung cancer screening is recommended for adults 62-79 years old who are at high risk for lung cancer because of a history of smoking.  A yearly low-dose CT scan of the lungs is recommended for people who:  Currently smoke.  Have quit within the past 15 years.  Have at least a 30-pack-year history of smoking. A pack year is smoking an average of one pack of cigarettes a day for 1 year.  Yearly screening should continue until it has been 15 years since you quit.  Yearly screening should stop if you develop a health problem that would prevent  you from having lung cancer treatment.  Breast Cancer  Practice breast self-awareness. This means understanding how your breasts normally appear and feel.  It also means doing regular breast self-exams. Let your health care provider know about any changes, no matter how small.  If you are in your 20s or 30s, you should have a clinical breast exam (CBE) by a health care provider every 1-3 years as part of a regular health exam.  If you are 5 or older, have a CBE every year. Also consider having a breast X-ray (mammogram) every year.  If you have a family history of breast cancer, talk to your health care provider about genetic screening.  If you are at high risk for breast cancer, talk to your health care provider about having an MRI and a mammogram every year.  Breast cancer gene (BRCA) assessment is recommended for women who have family members with BRCA-related cancers. BRCA-related cancers include:  Breast.  Ovarian.  Tubal.  Peritoneal cancers.  Results of the assessment will determine the need for genetic counseling and BRCA1 and BRCA2 testing. Cervical Cancer Routine pelvic examinations to screen for cervical cancer are no longer recommended for  nonpregnant women who are considered low risk for cancer of the pelvic organs (ovaries, uterus, and vagina) and who do not have symptoms. A pelvic examination may be necessary if you have symptoms including those associated with pelvic infections. Ask your health care provider if a screening pelvic exam is right for you.   The Pap test is the screening test for cervical cancer for women who are considered at risk.  If you had a hysterectomy for a problem that was not cancer or a condition that could lead to cancer, then you no longer need Pap tests.  If you are older than 65 years, and you have had normal Pap tests for the past 10 years, you no longer need to have Pap tests.  If you have had past treatment for cervical cancer or a  condition that could lead to cancer, you need Pap tests and screening for cancer for at least 20 years after your treatment.  If you no longer get a Pap test, assess your risk factors if they change (such as having a new sexual partner). This can affect whether you should start being screened again.  Some women have medical problems that increase their chance of getting cervical cancer. If this is the case for you, your health care provider may recommend more frequent screening and Pap tests.  The human papillomavirus (HPV) test is another test that may be used for cervical cancer screening. The HPV test looks for the virus that can cause cell changes in the cervix. The cells collected during the Pap test can be tested for HPV.  The HPV test can be used to screen women 70 years of age and older. Getting tested for HPV can extend the interval between normal Pap tests from three to five years.  An HPV test also should be used to screen women of any age who have unclear Pap test results.  After 43 years of age, women should have HPV testing as often as Pap tests.  Colorectal Cancer  This type of cancer can be detected and often prevented.  Routine colorectal cancer screening usually begins at 43 years of age and continues through 43 years of age.  Your health care provider may recommend screening at an earlier age if you have risk factors for colon cancer.  Your health care provider may also recommend using home test kits to check for hidden blood in the stool.  A small camera at the end of a tube can be used to examine your colon directly (sigmoidoscopy or colonoscopy). This is done to check for the earliest forms of colorectal cancer.  Routine screening usually begins at age 36.  Direct examination of the colon should be repeated every 5-10 years through 43 years of age. However, you may need to be screened more often if early forms of precancerous polyps or small growths are found. Skin  Cancer  Check your skin from head to toe regularly.  Tell your health care provider about any new moles or changes in moles, especially if there is a change in a mole's shape or color.  Also tell your health care provider if you have a mole that is larger than the size of a pencil eraser.  Always use sunscreen. Apply sunscreen liberally and repeatedly throughout the day.  Protect yourself by wearing long sleeves, pants, a wide-brimmed hat, and sunglasses whenever you are outside. HEART DISEASE, DIABETES, AND HIGH BLOOD PRESSURE   Have your blood pressure checked at least every 1-2  years. High blood pressure causes heart disease and increases the risk of stroke.  If you are between 44 years and 4 years old, ask your health care provider if you should take aspirin to prevent strokes.  Have regular diabetes screenings. This involves taking a blood sample to check your fasting blood sugar level.  If you are at a normal weight and have a low risk for diabetes, have this test once every three years after 43 years of age.  If you are overweight and have a high risk for diabetes, consider being tested at a younger age or more often. PREVENTING INFECTION  Hepatitis B  If you have a higher risk for hepatitis B, you should be screened for this virus. You are considered at high risk for hepatitis B if:  You were born in a country where hepatitis B is common. Ask your health care provider which countries are considered high risk.  Your parents were born in a high-risk country, and you have not been immunized against hepatitis B (hepatitis B vaccine).  You have HIV or AIDS.  You use needles to inject street drugs.  You live with someone who has hepatitis B.  You have had sex with someone who has hepatitis B.  You get hemodialysis treatment.  You take certain medicines for conditions, including cancer, organ transplantation, and autoimmune conditions. Hepatitis C  Blood testing is  recommended for:  Everyone born from [birth year] through 1964.  Anyone with known risk factors for hepatitis C. Sexually transmitted infections (STIs)  You should be screened for sexually transmitted infections (STIs) including gonorrhea and chlamydia if:  You are sexually active and are younger than 43 years of age.  You are older than 43 years of age and your health care provider tells you that you are at risk for this type of infection.  Your sexual activity has changed since you were last screened and you are at an increased risk for chlamydia or gonorrhea. Ask your health care provider if you are at risk.  If you do not have HIV, but are at risk, it may be recommended that you take a prescription medicine daily to prevent HIV infection. This is called pre-exposure prophylaxis (PrEP). You are considered at risk if:  You are sexually active and do not regularly use condoms or know the HIV status of your partner(s).  You take drugs by injection.  You are sexually active with a partner who has HIV. Talk with your health care provider about whether you are at high risk of being infected with HIV. If you choose to begin PrEP, you should first be tested for HIV. You should then be tested every 3 months for as long as you are taking PrEP.  PREGNANCY   If you are premenopausal and you may become pregnant, ask your health care provider about preconception counseling.  If you may become pregnant, take 400 to 800 micrograms (mcg) of folic acid every day.  If you want to prevent pregnancy, talk to your health care provider about birth control (contraception). OSTEOPOROSIS AND MENOPAUSE   Osteoporosis is a disease in which the bones lose minerals and strength with aging. This can result in serious bone fractures. Your risk for osteoporosis can be identified using a bone density scan.  If you are 17 years of age or older, or if you are at risk for osteoporosis and fractures, ask your health care  provider if you should be screened.  Ask your health care provider whether  you should take a calcium or vitamin D supplement to lower your risk for osteoporosis.  Menopause may have certain physical symptoms and risks.  Hormone replacement therapy may reduce some of these symptoms and risks. Talk to your health care provider about whether hormone replacement therapy is right for you.  HOME CARE INSTRUCTIONS   Schedule regular health, dental, and eye exams.  Stay current with your immunizations.   Do not use any tobacco products including cigarettes, chewing tobacco, or electronic cigarettes.  If you are pregnant, do not drink alcohol.  If you are breastfeeding, limit how much and how often you drink alcohol.  Limit alcohol intake to no more than 1 drink per day for nonpregnant women. One drink equals 12 ounces of beer, 5 ounces of wine, or 1 ounces of hard liquor.  Do not use street drugs.  Do not share needles.  Ask your health care provider for help if you need support or information about quitting drugs.  Tell your health care provider if you often feel depressed.  Tell your health care provider if you have ever been abused or do not feel safe at home. Document Released: 06/09/2011 Document Revised: 04/10/2014 Document Reviewed: 10/26/2013 [facility] Patient Information 2015 [place], [place]. This information is not intended to replace advice given to you by your health care provider. Make sure you discuss any questions you have with your health care provider.   Introduction to Bowel Health Diet and daily habits can help you predict when your bowels will move on a regular basis.  The consistency and quantity of the stool is usually more important than the frequency.  The goal is to have a regular bowel movement that is soft but formed.   Tips on Emptying Regularly . Eat breakfast.  Usually the best time of day for a bowel movement will be a half hour to an hour after eating.   These times are best because the body uses the gastrocolic reflex, a stimulation of bowel motion that occurs with eating, to help produce a bowel movement.  For some people even a simple hot drink in the morning can help the reflex action begin. . Eat all your meals at a predictable time each day.  The bowel functions best when food is introduced at the same regular intervals. . The amount of food eaten at a given time of day should be about the same size from day to day.  The bowel functions best when food is introduced in similar quantities from day to day. It is fine to have a small breakfast and a large lunch, or vice versa, just be consistent. . Eat two servings of fruit or vegetables and at least one serving of a complex carbohydrates (whole grains such as brown rice, bran, whole wheat bread, or oatmeal) at each meal. . Drink plenty of water-ideally eight glasses a day.  Be sure to increase your water intake if you are increasing fiber into your diet.  Maintain Healthy Habits . Exercise daily.  You may exercise at any time of day, but you may find that bowel function is helped most if the exercise is at a consistent time each day. . Make sure that you are not rushed and have convenient access to a bathroom at your selected time to empty your bowels.    2007, [employer] Doc.14

## 2015-05-18 NOTE — Progress Notes (Signed)
Subjective:  This chart was scribed for Pam Cheadle, MD by [name], Medical Scribe. This patient was seen in Room 22 and the patient's care was started 8:33 AM.    Patient ID: Pam Mckee, female    DOB: 1971-10-30, 44 y.o.   MRN: [id number] Chief Complaint  Patient presents with  . Annual Exam    w/pap  . Medication Refill    HPI Pam Mckee is a 44 y.o. female who presents to [facility] for annual exam.   Pt has history of hypothyroidism and had thyroid nodules. She sees Dr. Chalmers Cater for this. She last saw Dr. Chalmers Cater on May 31st. She haven't heard anything back. She did a biopsy before seeing Dr. Chalmers Cater.   She just had a sleep study, and was diagnosed with sleep apnea. They called her yesterday to recommend starting CPAP treatment. She has a follow up planned in August.   Pt states feeling left knee pain for a month now. She describes a lot of pain in her left lower back all the way down to her left leg. She believes it is coming from her left knee. She went to sports medicine to have an [place] and they believe it is bursitis. She took diclofenac but to no relief. She went back to naproxen, but it's also not effective. She used to have heel problems, and used naproxen to relief. She had bursitis in the past in her right elbow with cortisone injection treatment. She states doing 2 sets of 10 leg lifts for her left leg as home exercises. She has not been icing her knee.   She has seen a nutritionist for a month. Last week, she started eating better. She planned to do 20 minutes of walking 5 days a week. However, she only did it twice this week because of the pain from her knee.   She still takes high dose Vitamin D twice weekly.  She denies pelvic problems or concerns, vaginal discharge, and urine symptoms. She also denies new sexual partners.  She reports her periods sometimes being heavy.  She had a mammogram in October 2015 and saw some calcium. She has another check up in October 2016.  She  has some anemia but does not have family history of it. She takes iron supplement from time to time, but it makes her constipated. She eats grains for iron, and chicken 3 days a week for protein.     Past Medical History  Diagnosis Date  . Seizures   . Thyroid disease     hypo  . Hypertension    Current Outpatient Prescriptions on File Prior to Visit  Medication Sig Dispense Refill  . ergocalciferol (VITAMIN D2) 50000 UNITS capsule Take 1 capsule (50,000 Units total) by mouth once a week. 12 capsule 1  . hydrochlorothiazide (HYDRODIURIL) 25 MG tablet Take 1 tablet (25 mg total) by mouth daily. 90 tablet 3  . levothyroxine (SYNTHROID, LEVOTHROID) 50 MCG tablet Take 1 tablet (50 mcg total) by mouth daily before breakfast. 90 tablet 0  . losartan (COZAAR) 100 MG tablet Take 1 tablet (100 mg total) by mouth daily. 90 tablet 3  . naproxen sodium (ANAPROX) 550 MG tablet Take 1 tablet (550 mg total) by mouth 2 (two) times daily as needed (for pain with food). 180 tablet 0  . pantoprazole (PROTONIX) 40 MG tablet Take 40 mg by mouth daily.    . butalbital-acetaminophen-caffeine (FIORICET) 50-325-40 MG per tablet Take 1-2 tablets by mouth every 6 (  six) hours as needed for headache. (Patient not taking: Reported on 05/18/2015) 20 tablet 0  . diclofenac (VOLTAREN) 75 MG EC tablet Take 1 tablet (75 mg total) by mouth 2 (two) times daily. (Patient not taking: Reported on 05/18/2015) 30 tablet 0   No current facility-administered medications on file prior to visit.   No Known Allergies  History reviewed. No pertinent past surgical history. Family History  Problem Relation Age of Onset  . Hypertension Mother   . Hypertension Brother   . Diabetes Paternal Grandfather   . Renal Disease Father    History   Social History  . Marital Status: Single    Spouse Name: N/A  . Number of Children: 1  . 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 Smoking status: Never Smoker   . Smokeless tobacco: Not on file  . Alcohol Use: No  . Drug Use: No  . Sexual Activity: Yes    Birth Control/ Protection: Condom   Other Topics Concern  . None   Social History Narrative   Single.Exercise 3 times/week Zumba for 1 hour   Drinks about 2-3 cups of coffee/tea a day. Education: [employer].     Review of Systems  Genitourinary: Negative for dysuria, urgency, vaginal discharge and pelvic pain.  Musculoskeletal: Positive for arthralgias (left knee).  All other systems reviewed and are negative.      Objective:   Physical Exam  Constitutional: She is oriented to person, place, and time. She appears well-developed and well-nourished. No distress.  HENT:  Head: Normocephalic and atraumatic.  Eyes: EOM are normal. Pupils are equal, round, and reactive to light.  Neck: Neck supple.  Cardiovascular: Normal rate and normal heart sounds.   No murmur heard. Pulmonary/Chest: Effort normal and breath sounds normal. No respiratory distress.  Genitourinary: Vagina normal.  breast exam several approx 1cm firm nodules in right upper outer quadrant non tender mobile  vaginal exam normal labia majora and minora, vaginal vault normal Uterus anterior and anteroverted normal bimanual exam  Musculoskeletal: Normal range of motion.  Neurological: She is alert and oriented to person, place, and time.  Skin: Skin is warm and dry.  Psychiatric: She has a normal mood and affect. Her behavior is normal.  Nursing note and vitals reviewed.  Results for orders placed or performed in visit on 05/18/15  POCT Wet Prep with KOH  Result Value Ref Range   Trichomonas, UA Negative    Clue Cells Wet Prep HPF POC tntc    Epithelial Wet Prep HPF POC tntc    Yeast Wet Prep HPF POC neg    Bacteria Wet Prep HPF POC 3+    RBC Wet Prep HPF POC 0-3    WBC Wet Prep HPF POC 2-4    KOH Prep POC Positive      BP 130/90 mmHg  Pulse 67  Temp(Src)  98.3 F (36.8 C) (Oral)  Resp 16  Ht 5' 6.5" (1.689 m)  Wt 277 lb 3.2 oz (125.737 kg)  BMI 44.08 kg/m2  SpO2 99%  LMP 04/17/2015       Assessment & Plan:    1. Health maintenance examination   2. Anemia, unspecified anemia type - iron deficiency from menorrhagia but can't tol iron supp due to constipation - try different version, increase iron in diet.  3. Screening for cervical cancer - normal - repeat in 5 yrs - 2021  4. Screening for STD (sexually transmitted disease)   5. Pes anserine bursitis - offered cortisone inj - pt declines today, will start icing and home exercises, can RTC if she wants to try this in the future.  6. Vitamin D deficiency   7. Severe obesity (BMI >= 40)   8. Essential hypertension   9. Menorrhagia with regular cycle   10. 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 Left-sided low back pain with left-sided sciatica   13. Obstructive sleep apnea - new diagnosis, starting cpap soon  14. Bacterial vaginosis flagyl bid x 7d  15. Vaginal moniliasis - diflucan x 2  16. Constipation, unspecified constipation type - he has noticed recent constipation which is a new development. Advised increased fiber supplement and take prior to dinner. Advised to try magnesia supplement before bed. Hopefully will resolve after knee improves. Follow up if does not     Orders Placed This Encounter  Procedures  . CBC  . Ferritin  . IBC Panel  . Iron  . HIV antibody  . Hepatitis C antibody  . RPR  . Ambulatory referral to Physical Therapy    Referral Priority:  Routine    Referral Type:  Physical Medicine    Referral Reason:  Specialty Services Required    Requested Specialty:  Physical Therapy    Number of Visits Requested:  1  . POCT Wet Prep with KOH  . POCT urinalysis dipstick    Meds ordered this encounter  Medications  . indomethacin (INDOCIN) 50 MG capsule    Sig: Take 1 capsule (50 mg total) by  mouth 3 (three) times daily as needed for moderate pain (for knee).    Dispense:  90 capsule    Refill:  1  . Diclofenac Sodium 3 % GEL    Sig: Apply 2g topically to left inner lower knee over bursa bid prn pain    Dispense:  100 g    Refill:  5    Failed naproxen and oral diclofenac  . metroNIDAZOLE (FLAGYL) 500 MG tablet    Sig: Take 1 tablet (500 mg total) by mouth 2 (two) times daily.    Dispense:  14 tablet    Refill:  0  . fluconazole (DIFLUCAN) 150 MG tablet    Sig: Take 1 tablet (150 mg total) by mouth once. Repeat if needed after antibiotic course is complete    Dispense:  2 tablet    Refill:  0    I personally performed the services described in this documentation, which was scribed in my presence. The recorded information has been reviewed and considered, and addended by me as needed.  Pam Cheadle, MD MPH   Results for orders placed or performed in visit on 05/18/15  CBC  Result Value Ref Range   WBC 8.0 4.0 - 10.5 K/uL   RBC 4.80 3.87 - 5.11 MIL/uL   Hemoglobin 10.9 (L) 12.0 - 15.0 g/dL   HCT 34.7 (L) 36.0 - 46.0 %   MCV 72.3 (L) 78.0 - 100.0 fL   MCH 22.7 (L) 26.0 - 34.0 pg   MCHC 31.4 30.0 - 36.0 g/dL   RDW 16.6 (H) 11.5 - 15.5 %   Platelets 312 150 - 400 K/uL   MPV 10.0 8.6 - 12.4 fL  Ferritin  Result Value Ref Range   Ferritin 22 10 - 291 ng/mL  IBC Panel  Result Value Ref Range   UIBC 357 125 -  400 ug/dL   TIBC 395 250 - 470 ug/dL   %SAT 10 (L) 20 - 55 %  Iron  Result Value Ref Range   Iron 38 (L) 42 - 145 ug/dL  HIV antibody  Result Value Ref Range   HIV 1&2 Ab, 4th Generation NONREACTIVE NONREACTIVE  Hepatitis C antibody  Result Value Ref Range   HCV Ab NEGATIVE NEGATIVE  RPR  Result Value Ref Range   RPR Ser Ql NON REAC NON REAC  POCT Wet Prep with KOH  Result Value Ref Range   Trichomonas, UA Negative    Clue Cells Wet Prep HPF POC tntc    Epithelial Wet Prep HPF POC tntc    Yeast Wet Prep HPF POC neg    Bacteria Wet Prep HPF POC 3+     RBC Wet Prep HPF POC 0-3    WBC Wet Prep HPF POC 2-4    KOH Prep POC Positive   POCT urinalysis dipstick  Result Value Ref Range   Color, UA yellow    Clarity, UA clear    Glucose, UA neg    Bilirubin, UA neg    Ketones, UA neg    Spec Grav, UA 1.015    Blood, UA neg    pH, UA 7.5    Protein, UA neg    Urobilinogen, UA 0.2    Nitrite, UA neg    Leukocytes, UA Negative   Pap IG, CT/NG NAA, and HPV (high risk)  Result Value Ref Range   HPV DNA High Risk Not Detected    Specimen adequacy: SEE NOTE    FINAL DIAGNOSIS: SEE NOTE    COMMENTS: SEE NOTE    Cytotechnologist: SEE NOTE    Chlamydia Probe Amp NEGATIVE    GC Probe Amp NEGATIVE

## 2015-05-21 ENCOUNTER — Encounter: Payer: Self-pay | Admitting: Sports Medicine

## 2015-05-21 DIAGNOSIS — M25562 Pain in left knee: Secondary | ICD-10-CM | POA: Diagnosis not present

## 2015-05-21 MED ORDER — METHYLPREDNISOLONE ACETATE 40 MG/ML IJ SUSP
40.0000 mg | Freq: Once | INTRAMUSCULAR | Status: AC
  Administered 2015-05-21: 40 mg via INTRA_ARTICULAR

## 2015-05-21 NOTE — Progress Notes (Signed)
Pam Mckee - 44 y.o. female MRN [id number]  Date of birth: 07/30/1971  SUBJECTIVE: Including CC, HPI, ROS HISTORY:  CC: Left knee pain, initial evaluation   HPI: Medial knee pain, non radiating. Present for prolonged period, intermittent. Worsened over past 3 weeks after starting exercise regimen. Working on wt loss and has been successful.  Has tried OTC NSAIDs, ice.  Prior non-wt bearing x-rays minimal degenerative changes.Positive theater sign.  Reports feeling a pop and then progress swelling of the left leg following this several weeks ago.  Reports bilateral lower extremity swelling on a regular basis that resolves at night  & with elevation  ROS: Pt denies any radicular symptoms, change in bowel or bladder habits, muscle weakness, numbness or falls associated with her pain.  No fvers, chills, night sweats or weight loss.   No specialty comments available. 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 Smoking status: Never Smoker   . Smokeless tobacco: Not on file  . Alcohol Use: No  . Drug Use: No  . Sexual Activity: Yes    Birth Control/ Protection: Condom      Problem  Left Knee Pain   Nonweightbearing x-rays revealed mild degenerative changes     OBJECTIVE: VITALS: HT:5\' 7"  (170.2 cm) WT:270 lb (122.471 kg) BMI:42.4 BP:122/78 mmHg HR: bpm TEMP: ( ) RESP:  PHYSICAL EXAM: GENERAL: Adult obese female. No acute distress PSYCH: Alert and appropriately interactive. SKIN: No open skin lesions or abnormal skin markings on areas inspected as below VASCULAR: trace bilateral pre-tibial pitting edema; dp/pt pulses intact NEURO: sensation intact to light touch, extensor mechanism strength 5/5 LEFT Knee: slight genu valgus. No significant effusion slight medial>lateral joint line tenderness, no patellar grind. Small prominence in posterior knee without overt baker's cyst. sensation intact to light touch, extensor  mechanism strength 5/5. Pain with McMurray's without locking. Negative patellar grind.      Limited MSK Ultrasound of LEFT Kee: Findings: Patella & Patellar Tendon: Normal,  Quad & Quad Tendon: Normal Suprapatellar Pouch: no significant effusion Medial Joint Line: slightly extruded meniscus with peri-meniscal fluid narrow jointline Lateral Joint Line: Normal Posterior Knee: small decompressed Baker's cyst  Impression: The above findings are consistent with medial joint line degenerative changes     ASSESSMENT & PLAN: See Patient instructions for additional information     ICD-9-CM ICD-10-CM   1. Left knee pain 719.46 M25.562      Left knee pain Suspect degenerative process with likely degenerative meniscal tear.  Encourage continued weight loss, therapeutic exercises work on quadriceps strengthening in provided injection as below. Discussed using compression. Patient will trial their body helix compression sleeve over the counter compression sleeve she already has.  PROCEDURE NOTE : LEFT Knee Injection After discussing the risks, benefits and expected outcomes of the injection and all questions were reviewed and answered,  she wished to undergo the above named procedure.  Written consent was obtained. After an appropriate time out was taken the target structure prepped and injected as below: Prep:  Betadine and alcohol,  Approach:  Anteromedial  Anesthes  Ethel chloride,   Needle:  22g 1.5 inch  Aspirate:  n/a  Meds:  3:1 :: 1% lidocaine:40mg  Depomedrol  Dressing:  Bandaid  This procedure was well tolerated and there were no complications.      We will see how she does with this injection. Discussed QUAD sets and SLR for HEP and slowly return to  activity as tolerated.    FOLLOW UP:  Return if symptoms worsen or fail to improve.

## 2015-05-21 NOTE — Assessment & Plan Note (Addendum)
Suspect degenerative process with likely degenerative meniscal tear.  Encourage continued weight loss, therapeutic exercises work on quadriceps strengthening in provided injection as below. Discussed using compression. Patient will trial their body helix compression sleeve over the counter compression sleeve she already has.  PROCEDURE NOTE : LEFT Knee Injection After discussing the risks, benefits and expected outcomes of the injection and all questions were reviewed and answered,  she wished to undergo the above named procedure.  Written consent was obtained. After an appropriate time out was taken the target structure prepped and injected as below: Prep:  Betadine and alcohol,  Approach:  Anteromedial  Anesthes  Ethel chloride,   Needle:  22g 1.5 inch  Aspirate:  n/a  Meds:  3:1 :: 1% lidocaine:40mg  Depomedrol  Dressing:  Bandaid  This procedure was well tolerated and there were no complications.

## 2015-05-22 LAB — PAP IG, CT-NG NAA, HPV HIGH-RISK
Chlamydia Probe Amp: NEGATIVE
GC Probe Amp: NEGATIVE
HPV DNA High Risk: NOT DETECTED

## 2015-06-06 ENCOUNTER — Encounter: Payer: Self-pay | Admitting: *Deleted

## 2015-06-06 DIAGNOSIS — E039 Hypothyroidism, unspecified: Secondary | ICD-10-CM | POA: Insufficient documentation

## 2015-06-06 DIAGNOSIS — E041 Nontoxic single thyroid nodule: Secondary | ICD-10-CM

## 2015-06-20 ENCOUNTER — Ambulatory Visit: Payer: 59 | Admitting: Dietician

## 2015-06-20 ENCOUNTER — Encounter: Payer: Self-pay | Admitting: Family Medicine

## 2015-06-27 ENCOUNTER — Telehealth: Payer: Self-pay | Admitting: Neurology

## 2015-06-27 NOTE — Telephone Encounter (Signed)
Called number below, patient not back at work yet.  Called cell: no answer and no vm

## 2015-06-27 NOTE — Telephone Encounter (Signed)
Pt called and states that she has not been set up with a cpap and is scheduled to come in for a compliance appt on 8/11. No one has called her about the equipment. Please call and advise [phone].

## 2015-06-27 NOTE — Telephone Encounter (Signed)
I spoke to patient and she is aware that I have contacted Webb Silversmith with [facility] and have cancelled her up coming appt.

## 2015-06-27 NOTE — Telephone Encounter (Signed)
Patient called returning Diana's phone call. She can be reached at [phone].

## 2015-06-27 NOTE — Telephone Encounter (Signed)
I called patient's work number, patient not there at time. I called cell but no answer and vm is full. Unable to leave message. I sent Webb Silversmith with [facility] a message to help get this patient set up. Orders were sent to [facility] 05/17/15. I will cancel patient's up coming appt.

## 2015-07-10 ENCOUNTER — Ambulatory Visit (INDEPENDENT_AMBULATORY_CARE_PROVIDER_SITE_OTHER): Payer: 59

## 2015-07-10 ENCOUNTER — Ambulatory Visit (INDEPENDENT_AMBULATORY_CARE_PROVIDER_SITE_OTHER): Payer: 59 | Admitting: Family Medicine

## 2015-07-10 VITALS — BP 112/86 | HR 77 | Temp 98.3°F | Resp 16 | Ht 66.5 in | Wt 279.2 lb

## 2015-07-10 DIAGNOSIS — M545 Low back pain: Secondary | ICD-10-CM | POA: Diagnosis not present

## 2015-07-10 DIAGNOSIS — M5432 Sciatica, left side: Secondary | ICD-10-CM | POA: Insufficient documentation

## 2015-07-10 DIAGNOSIS — M25552 Pain in left hip: Secondary | ICD-10-CM | POA: Diagnosis not present

## 2015-07-10 DIAGNOSIS — M25562 Pain in left knee: Secondary | ICD-10-CM

## 2015-07-10 MED ORDER — CYCLOBENZAPRINE HCL 5 MG PO TABS
5.0000 mg | ORAL_TABLET | Freq: Three times a day (TID) | ORAL | Status: DC | PRN
Start: 2015-07-10 — End: 2015-07-10

## 2015-07-10 MED ORDER — NAPROXEN SODIUM 550 MG PO TABS
550.0000 mg | ORAL_TABLET | Freq: Two times a day (BID) | ORAL | Status: DC
Start: 2015-07-10 — End: 2015-07-10

## 2015-07-10 MED ORDER — NAPROXEN SODIUM 550 MG PO TABS: 550.0000 mg | ORAL_TABLET | Freq: Two times a day (BID) | ORAL | Status: DC

## 2015-07-10 MED ORDER — CYCLOBENZAPRINE HCL 5 MG PO TABS: 5.0000 mg | ORAL_TABLET | Freq: Three times a day (TID) | ORAL | Status: DC | PRN

## 2015-07-10 NOTE — Patient Instructions (Signed)

## 2015-07-10 NOTE — Progress Notes (Signed)
MRN: [id number] DOB: 1971/12/02  Subjective:   Pam Mckee is a 44 y.o. female presenting for chief complaint of Back Pain  Back pain - reports 2 month history of left sided low back pain, pain is sharp and intermittent worse at night when she lays down. During the day, she has occasional sharp pain with twisting and turning. Has tried indomethacin for knee pain but without any relief of her back pain. Has also tried Tylenol with some relief. Denies fever, night sweats, weight loss, decreased appetite, radiation of pain, incontinence, constipation, recent injury to her back, history of back surgery.   Knee pain - reports several month history of knee pain, bursitis diagnosed by [facility], Dr. Paulla Fore provided performed a knee injection. She had significant improvement with this but her knee pain returned this week. She has difficulty with flexion. Patient has not returned for followup with Dr. Paulla Fore. She denies redness, swelling, knee injury, history of gout.  Denies any other aggravating or relieving factors, no other questions or concerns.  Pam Mckee has a current medication list which includes the following prescription(s): hydrochlorothiazide, indomethacin, levothyroxine, and losartan. She has No Known Allergies.  Pam Mckee  has a past medical history of Seizures; Thyroid disease; and Hypertension. Also  has no past surgical history on file.  ROS As in subjective.  Objective:   Vitals: BP 112/86 mmHg  Pulse 77  Temp(Src) 98.3 F (36.8 C) (Oral)  Resp 16  Ht 5' 6.5" (1.689 m)  Wt 279 lb 3.2 oz (126.644 kg)  BMI 44.39 kg/m2  SpO2 98%  LMP 06/23/2015  Physical Exam  Constitutional: She is oriented to person, place, and time. She appears well-developed and well-nourished.  Cardiovascular: Normal rate.   Pulmonary/Chest: Effort normal.  Musculoskeletal:       Left knee: She exhibits decreased range of motion (full flexion) and swelling (trace edema). She exhibits no  effusion, no ecchymosis, no deformity, no laceration, no erythema, normal patellar mobility and no bony tenderness. No tenderness found.       Lumbar back: She exhibits tenderness (over area depicted). She exhibits normal range of motion, no bony tenderness, no swelling, no edema, no deformity, no laceration and no spasm.       Back:  Negative SLR.  Neurological: She is alert and oriented to person, place, and time. She has normal reflexes.  Skin: Skin is warm and dry. No rash noted. No erythema. No pallor.    [facility] reading (PRIMARY) by  Dr. Tamala Julian and PA-Destynee Stringfellow. Lumbar - mild degenerative changes. Left hip - normal.  Dg Lumbar Spine Complete  07/11/2015   CLINICAL DATA:  Left lower back pain, sciatica. Symptoms for 2 months, sharp and intermittent, worse at night when she lays down.  EXAM: LUMBAR SPINE - COMPLETE 4+ VIEW  COMPARISON:  None.  FINDINGS: Mild diffuse degenerative disc disease with disc space narrowing and spurring. Mild degenerative facet disease from L3-4 through L5-S1. Transitional anatomy at the lumbosacral junction with partial sacralization of L5. No fracture or subluxation. SI joints are symmetric and unremarkable.  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 No acute findings.   Electronically Signed   By: Rolm Baptise M.D.   On: 07/11/2015 13:14   Dg Hip Unilat W Or W/o Pelvis 2-3 Views Left  07/11/2015   CLINICAL DATA:  Two month history of left-sided low back pain. No known injury. Initial encounter.  EXAM: DG HIP (WITH OR WITHOUT PELVIS) 2-3V  LEFT  COMPARISON:  None.  FINDINGS: There is no evidence of hip fracture or dislocation. There is no evidence of arthropathy or other focal bone abnormality.  IMPRESSION: Negative exam.   Electronically Signed   By: Inge Rise M.D.   On: 07/11/2015 13:13   Assessment and Plan :   1. Left low back pain, with sciatica presence unspecified 2. Left hip pain 3. Left knee pain - Physical exam findings  reassuring, recent knee x-ray 04/30/2015 and x-rays from today also reassuring - Recommended patient lose weight as her knee and back pain may be due to this at BMI 45. Provided patient with back exercises and manual for back care. Offered patient Anaprox and Flexeril. Recommended she follow up with Dr. Paulla Fore at [facility]. Patient agreed.   Pam Eagles, PA-C [facility] [phone] 07/10/2015 6:07 PM

## 2015-07-11 ENCOUNTER — Other Ambulatory Visit: Payer: Self-pay | Admitting: *Deleted

## 2015-07-11 DIAGNOSIS — M25562 Pain in left knee: Secondary | ICD-10-CM

## 2015-07-12 ENCOUNTER — Other Ambulatory Visit: Payer: Self-pay | Admitting: Physician Assistant

## 2015-07-12 ENCOUNTER — Other Ambulatory Visit (INDEPENDENT_AMBULATORY_CARE_PROVIDER_SITE_OTHER): Payer: 59

## 2015-07-12 DIAGNOSIS — D509 Iron deficiency anemia, unspecified: Secondary | ICD-10-CM | POA: Diagnosis not present

## 2015-07-12 LAB — POCT CBC
Granulocyte percent: 57.4 %G (ref 37–80)
HCT, POC: 32.8 % — AB (ref 37.7–47.9)
Hemoglobin: 10.2 g/dL — AB (ref 12.2–16.2)
Lymph, poc: 3.7 — AB (ref 0.6–3.4)
MCH, POC: 22.3 pg — AB (ref 27–31.2)
MCHC: 31 g/dL — AB (ref 31.8–35.4)
MCV: 71.9 fL — AB (ref 80–97)
MID (cbc): 0.6 (ref 0–0.9)
MPV: 7.6 fL (ref 0–99.8)
POC Granulocyte: 5.8 (ref 2–6.9)
POC LYMPH PERCENT: 36.2 %L (ref 10–50)
POC MID %: 6.4 %M (ref 0–12)
Platelet Count, POC: 341 10*3/uL (ref 142–424)
RBC: 4.57 M/uL (ref 4.04–5.48)
RDW, POC: 15.9 %
WBC: 10.1 10*3/uL (ref 4.6–10.2)

## 2015-07-13 LAB — IRON AND TIBC
%SAT: 8 % — ABNORMAL LOW (ref 20–55)
Iron: 30 ug/dL — ABNORMAL LOW (ref 42–145)
TIBC: 371 ug/dL (ref 250–470)
UIBC: 341 ug/dL (ref 125–400)

## 2015-07-13 LAB — FERRITIN: Ferritin: 34 ng/mL (ref 10–291)

## 2015-07-16 LAB — PATHOLOGIST SMEAR REVIEW

## 2015-07-16 MED ORDER — POLYETHYLENE GLYCOL 3350 17 GM/SCOOP PO POWD: 17.0000 g | Freq: Two times a day (BID) | ORAL | Status: DC | PRN

## 2015-07-19 ENCOUNTER — Ambulatory Visit (INDEPENDENT_AMBULATORY_CARE_PROVIDER_SITE_OTHER): Payer: 59 | Admitting: Family Medicine

## 2015-07-19 ENCOUNTER — Ambulatory Visit: Payer: Self-pay | Admitting: Neurology

## 2015-07-19 VITALS — BP 126/88 | HR 96 | Temp 98.7°F | Resp 18 | Ht 66.26 in | Wt 270.4 lb

## 2015-07-19 DIAGNOSIS — N920 Excessive and frequent menstruation with regular cycle: Secondary | ICD-10-CM

## 2015-07-19 DIAGNOSIS — M5432 Sciatica, left side: Secondary | ICD-10-CM

## 2015-07-19 DIAGNOSIS — D509 Iron deficiency anemia, unspecified: Secondary | ICD-10-CM | POA: Diagnosis not present

## 2015-07-19 DIAGNOSIS — G4733 Obstructive sleep apnea (adult) (pediatric): Secondary | ICD-10-CM

## 2015-07-19 DIAGNOSIS — K5900 Constipation, unspecified: Secondary | ICD-10-CM

## 2015-07-19 DIAGNOSIS — M25562 Pain in left knee: Secondary | ICD-10-CM | POA: Diagnosis not present

## 2015-07-19 LAB — POCT URINE PREGNANCY: Preg Test, Ur: NEGATIVE

## 2015-07-19 MED ORDER — POLYETHYLENE GLYCOL 3350 17 GM/SCOOP PO POWD: 17.0000 g | Freq: Two times a day (BID) | ORAL | Status: DC | PRN

## 2015-07-19 MED ORDER — MEDROXYPROGESTERONE ACETATE 150 MG/ML IM SUSP
150.0000 mg | Freq: Once | INTRAMUSCULAR | Status: AC
  Administered 2015-07-19: 150 mg via INTRAMUSCULAR

## 2015-07-19 MED ORDER — INDOMETHACIN 50 MG PO CAPS: 50.0000 mg | ORAL_CAPSULE | Freq: Two times a day (BID) | ORAL | Status: DC

## 2015-07-19 MED ORDER — TRIAMCINOLONE ACETONIDE 40 MG/ML IJ SUSP: 40.0000 mg | Freq: Once | INTRAMUSCULAR | Status: DC

## 2015-07-19 NOTE — Patient Instructions (Addendum)
Call [facility] to see about getting your cpap set up. Call [facility] on [facility] to get treatment for your sciatica. Continue taking the slow iron every day and recheck with me in 3 months.   Start miralax daily as well as continue on the fiber gummies daily.  Use senokot S as needed for additional constipation. Joint Injection Care After Refer to this sheet in the next few days. These instructions provide you with information on caring for yourself after you have had a joint injection. Your caregiver also may give you more specific instructions. Your treatment has been planned according to current medical practices, but problems sometimes occur. Call your caregiver if you have any problems or questions after your procedure. After any type of joint injection, it is not uncommon to experience:  Soreness, swelling, or bruising around the injection site.  Mild numbness, tingling, or weakness around the injection site caused by the numbing medicine used before or with the injection. It also is possible to experience the following effects associated with the specific agent after injection:  Iodine-based contrast agents:  Allergic reaction (itching, hives, widespread redness, and swelling beyond the injection site).  Corticosteroids (These effects are rare.):  Allergic reaction.  Increased blood sugar levels (If you have diabetes and you notice that your blood sugar levels have increased, notify your caregiver).  Increased blood pressure levels.  Mood swings.  Hyaluronic acid in the use of viscosupplementation.  Temporary heat or redness.  Temporary rash and itching.  Increased fluid accumulation in the injected joint. These effects all should resolve within a day after your procedure.  HOME CARE INSTRUCTIONS  Limit yourself to light activity the day of your procedure. Avoid lifting heavy objects, bending, stooping, or  twisting.  Take prescription or over-the-counter pain medication as directed by your caregiver.  You may apply ice to your injection site to reduce pain and swelling the day of your procedure. Ice may be applied 03-04 times:  Put ice in a plastic bag.  Place a towel between your skin and the bag.  Leave the ice on for no longer than 15-20 minutes each time. SEEK IMMEDIATE MEDICAL CARE IF:   Pain and swelling get worse rather than better or extend beyond the injection site.  Numbness does not go away.  Blood or fluid continues to leak from the injection site.  You have chest pain.  You have swelling of your face or tongue.  You have trouble breathing or you become dizzy.  You develop a fever, chills, or severe tenderness at the injection site that last longer than 1 day. MAKE SURE YOU:  Understand these instructions.  Watch your condition.  Get help right away if you are not doing well or if you get worse. Document Released: 08/07/2011 Document Revised: 02/16/2012 Document Reviewed: 08/07/2011 [facility] Patient Information 2015 [place], [place]. This information is not intended to replace advice given to you by your health care provider. Make sure you discuss any questions you have with your health care provider.  Piriformis Syndrome with Rehab Piriformis syndrome is a condition the affects the nervous system in the area of the hip, and is characterized by pain and possibly a loss of feeling in the backside (posterior) thigh that may extend down the entire length of the leg. The symptoms are caused by an increase in pressure on the sciatic nerve by the piriformis muscle, which is on the back of the hip and is responsible  for externally rotating the hip. The sciatic nerve and its branches connect to much of the leg. Normally the sciatic nerve runs between the piriformis muscle and other muscles. However, in certain individuals the nerve runs through the muscle, which causes an  increase in pressure on the nerve and results in the symptoms of piriformis syndrome. SYMPTOMS   Pain, tingling, numbness, or burning in the back of the thigh that may also extend down the entire leg.  Occasionally, tenderness in the buttock.  Loss of function of the leg.  Pain that worsens when using the piriformis muscle (running, jumping, or stairs).  Pain that increases with prolonged sitting.  Pain that is lessened by lying flat on the back. CAUSES   Piriformis syndrome is the result of an increase in pressure placed on the sciatic nerve. Oftentimes, piriformis syndrome is an overuse injury.  Stress placed on the nerve from a sudden increase in the intensity, frequency, or duration of training.  Compensation of other extremity injuries. RISK INCREASES WITH:  Sports that involve the piriformis muscle (running, walking, or jumping).  You are born with (congenital) a defect in which the sciatic nerve passes through the muscle. PREVENTION  Warm up and stretch properly before activity.  Allow for adequate recovery between workouts.  Maintain physical fitness:  Strength, flexibility, and endurance.  Cardiovascular fitness. PROGNOSIS  If treated properly, the symptoms of piriformis syndrome usually resolve in 2 to 6 weeks. RELATED COMPLICATIONS   Persistent and possibly permanent pain and numbness in the lower extremity.  Weakness of the extremity that may progress to disability and inability to compete. TREATMENT  The most effective treatment for piriformis syndrome is rest from any activities that aggravate the symptoms. Ice and pain medication may help reduce pain and inflammation. The use of strengthening and stretching exercises may help reduce pain with activity. These exercises may be performed at home or with a therapist. A referral to a therapist may be given for further evaluation and treatment, such as ultrasound. Corticosteroid injections may be given to reduce  inflammation that is causing pressure to be placed on the sciatic nerve. If nonsurgical (conservative) treatment is unsuccessful, then surgery may be recommended.  MEDICATION   If pain medication is necessary, then nonsteroidal anti-inflammatory medications, such as aspirin and ibuprofen, or other minor pain relievers, such as acetaminophen, are often recommended.  Do not take pain medication for 7 days before surgery.  Prescription pain relievers may be given if deemed necessary by your caregiver. Use only as directed and only as much as you need.  Corticosteroid injections may be given by your caregiver. These injections should be reserved for the most serious cases, because they may only be given a certain number of times. HEAT AND COLD:   Cold treatment (icing) relieves pain and reduces inflammation. Cold treatment should be applied for 10 to 15 minutes every 2 to 3 hours for inflammation and pain and immediately after any activity that aggravates your symptoms. Use ice packs or massage the area with a piece of ice (ice massage).  Heat treatment may be used prior to performing the stretching and strengthening activities prescribed by your caregiver, physical therapist, or athletic trainer. Use a heat pack or soak the injury in warm water. SEEK IMMEDIATE MEDICAL CARE IF:  Treatment seems to offer no benefit, or the condition worsens.  Any medications produce adverse side effects. EXERCISES RANGE OF MOTION (ROM) AND STRETCHING EXERCISES - Piriformis Syndrome These exercises may help you when beginning  to rehabilitate your injury. Your symptoms may resolve with or without further involvement from your physician, physical therapist, or athletic trainer. While completing these exercises, remember:   Restoring tissue flexibility helps normal motion to return to the joints. This allows healthier, less painful movement and activity.  An effective stretch should be held for at least 30  seconds.  A stretch should never be painful. You should only feel a gentle lengthening or release in the stretched tissue. STRETCH - Hip Rotators  Lie on your back on a firm surface. Grasp your right / left knee with your right / left hand and your ankle with your opposite hand.  Keeping your hips and shoulders firmly planted, gently pull your right / left knee and rotate your lower leg toward your opposite shoulder until you feel a stretch in your buttocks.  Hold this stretch for __________ seconds. Repeat this stretch __________ times. Complete this stretch __________ times per day. STRETCH - Iliotibial Band  On the floor or bed, lie on your side so your right / left leg is on top. Bend your knee and grab your ankle.  Slowly bring your knee back so that your thigh is in line with your trunk. Keep your heel at your buttocks and gently arch your back so your head, shoulders, and hips line up.  Slowly lower your leg so that your knee approaches the floor/bed until you feel a gentle stretch on the outside of your right / left thigh. If you do not feel a stretch and your knee will not fall farther, place the heel of your opposite foot on top of your knee and pull your thigh down farther.  Hold this stretch for __________ seconds. Repeat __________ times. Complete __________ times per day. STRENGTHENING EXERCISES - Piriformis Syndrome  These are some of the caregiver again or until your symptoms are resolved. Remember:   Strong muscles with good endurance tolerate stress better.  Do the exercises as initially prescribed by your caregiver. Progress slowly with each exercise, gradually increasing the number of repetitions and weight used under their guidance. STRENGTH - Hip Abductors, Straight Leg Raises Be aware of your form throughout the entire exercise so that you exercise the correct muscles. Sloppy form means that you are not strengthening the correct muscles.  Lie on your side so that  your head, shoulders, knee, and hip line up. You may bend your lower knee to help maintain your balance. Your right / left leg should be on top.  Roll your hips slightly forward, so that your hips are stacked directly over each other and your right / left knee is facing forward.  Lift your top leg up 4-6 inches, leading with your heel. Be sure that your foot does not drift forward or that your knee does not roll toward the ceiling.  Hold this position for __________ seconds. You should feel the muscles in your outer hip lifting (you may not notice this until your leg begins to tire).  Slowly lower your leg to the starting position. Allow the muscles to fully relax before beginning the next repetition. Repeat __________ times. Complete this exercise __________ times per day.  STRENGTH - Hip Abductors, Quadruped  On a firm, lightly padded surface, position yourself on your hands and knees. Your hands should be directly below your shoulders and your knees should be directly below your hips.  Keeping your right / left knee bent, lift your leg out to the side. Keep your legs level and  in line with your shoulders.  Position yourself on your hands and knees.  Hold for __________ seconds.  Keeping your trunk steady and your hips level, slowly lower your leg to the starting position. Repeat __________ times. Complete this exercise __________ times per day.  STRENGTH - Hip Abductors, Standing  Tie one end of a rubber exercise band/tubing to a secure surface (table, pole) and tie a loop at the other end.  Place the loop around your right / left ankle. Keeping your ankle with the band directly opposite of the secured end, step away until there is tension in the tube/band.  Hold onto a chair as needed for balance.  Keeping your back upright, your shoulders over your hips, and your toes pointing forward, lift your right / left leg out to your side. Be sure to lift your leg with your hip muscles. Do  not "throw" your leg or tip your body to lift your leg.  Slowly and with control, return to the starting position. Repeat exercise __________ times. Complete this exercise __________ times per day.  Document Released: 11/24/2005 Document Revised: 04/10/2014 Document Reviewed: 03/08/2009 [facility] Patient Information 2015 [place], [place]. This information is not intended to replace advice given to you by your health care provider. Make sure you discuss any questions you have with your health care provider. About Constipation  Constipation Overview Constipation is the most common gastrointestinal complaint - about 4 million Americans experience constipation and make 2.5 million physician visits a year to get help for the problem.  Constipation can occur when the colon absorbs too much water, the colon's muscle contraction is slow or sluggish, and/or there is delayed transit time through the colon.  The result is stool that is hard and dry.  Indicators of constipation include straining during bowel movements greater than 25% of the time, having fewer than three bowel movements per week, and/or the feeling of incomplete evacuation.  There are established guidelines ([place] II ) for defining constipation. A person needs to have two or more of the following symptoms for at least 12 weeks (not necessarily consecutive) in the preceding 12 months: . Straining in  greater than 25% of bowel movements . Lumpy or hard stools in greater than 25% of bowel movements . Sensation of incomplete emptying in greater than 25% of bowel movements . Sensation of anorectal obstruction/blockade in greater than 25% of bowel movements . Manual maneuvers to help empty greater than 25% of bowel movements (e.g., digital evacuation, support of the pelvic floor)  . Less than  3 bowel movements/week . Loose stools are not present, and criteria for irritable bowel syndrome are insufficient  Common Causes of Constipation . Lack of  fiber in your diet . Lack of physical activity . Medications, including iron and calcium supplements  . Dairy intake . Dehydration . Abuse of laxatives  Travel  Irritable Bowel Syndrome  Pregnancy  Luteal phase of menstruation (after ovulation and before menses)  Colorectal problems  Intestinal Dysfunction  Treating Constipation  There are several ways of treating constipation, including changes to diet and exercise, use of laxatives, adjustments to the pelvic floor, and scheduled toileting.  These treatments include: . increasing fiber and fluids in the diet  . increasing physical activity . learning muscle coordination   learning proper toileting techniques and toileting modifications   designing and sticking  to a toileting schedule     2007, [employer] Doc.22  Constipation Constipation is when a person has fewer than three bowel  movements a week, has difficulty having a bowel movement, or has stools that are dry, hard, or larger than normal. As people grow older, constipation is more common. If you try to fix constipation with medicines that make you have a bowel movement (laxatives), the problem may get worse. Long-term laxative use may cause the muscles of the colon to become weak. A low-fiber diet, not taking in enough fluids, and taking certain medicines may make constipation worse.  CAUSES   Certain medicines, such as antidepressants, pain medicine, iron supplements, antacids, and water pills.   Certain diseases, such as diabetes, irritable bowel syndrome (IBS), thyroid disease, or depression.   Not drinking enough water.   Not eating enough fiber-rich foods.   Stress or travel.   Lack of physical activity or exercise.   Ignoring the urge to have a bowel movement.   Using laxatives too much.  SIGNS AND SYMPTOMS   Having fewer than three bowel movements a week.   Straining to have a bowel movement.   Having stools that are  hard, dry, or larger than normal.   Feeling full or bloated.   Pain in the lower abdomen.   Not feeling relief after having a bowel movement.  DIAGNOSIS  Your health care provider will take a medical history and perform a physical exam. Further testing may be done for severe constipation. Some tests may include:  A barium enema X-ray to examine your rectum, colon, and, sometimes, your small intestine.   A sigmoidoscopy to examine your lower colon.   A colonoscopy to examine your entire colon. TREATMENT  Treatment will depend on the severity of your constipation and what is causing it. Some dietary treatments include drinking more fluids and eating more fiber-rich foods. Lifestyle treatments may include regular exercise. If these diet and lifestyle recommendations do not help, your health care provider may recommend taking over-the-counter laxative medicines to help you have bowel movements. Prescription medicines may be prescribed if over-the-counter medicines do not work.  HOME CARE INSTRUCTIONS   Eat foods that have a lot of fiber, such as fruits, vegetables, whole grains, and beans.  Limit foods high in fat and processed sugars, such as french fries, hamburgers, cookies, candies, and soda.   A fiber supplement may be added to your diet if you cannot get enough fiber from foods.   Drink enough fluids to keep your urine clear or pale yellow.   Exercise regularly or as directed by your health care provider.   Go to the restroom when you have the urge to go. Do not hold it.   Only take over-the-counter or prescription medicines as directed by your health care provider. Do not take other medicines for constipation without talking to your health care provider first.  [place] IF:   You have bright red blood in your stool.   Your constipation lasts for more than 4 days or gets worse.   You have abdominal or rectal pain.   You have thin,  pencil-like stools.   You have unexplained weight loss. MAKE SURE YOU:   Understand these instructions.  Will watch your condition.  Will get help right away if you are not doing well or get worse. Document Released: 08/22/2004 Document Revised: 11/29/2013 Document Reviewed: 09/05/2013 [facility] Patient Information 2015 [place], [place]. This information is not intended to replace advice given to you by your health care provider. Make sure you discuss any questions you have with your health care provider.  Iron-Rich Diet  An iron-rich diet contains foods that are good sources of iron. Iron is an important mineral that helps your body produce hemoglobin. Hemoglobin is a protein in red blood cells that carries oxygen to the body's tissues. Sometimes, the iron level in your blood can be low. This may be caused by:  A lack of iron in your diet.  Blood loss.  Times of growth, such as during pregnancy or during a child's growth and development. Low levels of iron can cause a decrease in the number of red blood cells. This can result in iron deficiency anemia. Iron deficiency anemia symptoms include:  Tiredness.  Weakness.  Irritability.  Increased chance of infection. Here are some recommendations for daily iron intake:  Males older than 43 years of age need 8 mg of iron per day.  Women ages 49 to 27 need 18 mg of iron per day.  Pregnant women need 27 mg of iron per day, and women who are over 25 years of age and breastfeeding need 9 mg of iron per day.  Women over the age of 59 need 8 mg of iron per day. SOURCES OF IRON There are 2 types of iron that are found in food: heme iron and nonheme iron. Heme iron is absorbed by the body better than nonheme iron. Heme iron is found in meat, poultry, and fish. Nonheme iron is found in grains, beans, and vegetables. Heme Iron Sources Food / Iron (mg)  Chicken liver, 3 oz (85 g)/ 10 mg  Beef liver, 3 oz (85 g)/ 5.5 mg  Oysters, 3 oz (85  g)/ 8 mg  Beef, 3 oz (85 g)/ 2 to 3 mg  Shrimp, 3 oz (85 g)/ 2.8 mg  [place], 3 oz (85 g)/ 2 mg  Chicken, 3 oz (85 g) / 1 mg  Fish (tuna, halibut), 3 oz (85 g)/ 1 mg  Pork, 3 oz (85 g)/ 0.9 mg Nonheme Iron Sources Food / Iron (mg)  Ready-to-eat breakfast cereal, iron-fortified / 3.9 to 7 mg  Tofu,  cup / 3.4 mg  Kidney beans,  cup / 2.6 mg  Baked potato with skin / 2.7 mg  Asparagus,  cup / 2.2 mg  Avocado / 2 mg  Dried peaches,  cup / 1.6 mg  Raisins,  cup / 1.5 mg  Soy milk, 1 cup / 1.5 mg  Whole-wheat bread, 1 slice / 1.2 mg  Spinach, 1 cup / 0.8 mg  Broccoli,  cup / 0.6 mg IRON ABSORPTION Certain foods can decrease the body's absorption of iron. Try to avoid these foods and beverages while eating meals with iron-containing foods:  Coffee.  Tea.  Fiber.  Soy. Foods containing vitamin C can help increase the amount of iron your body absorbs from iron sources, especially from nonheme sources. Eat foods with vitamin C along with iron-containing foods to increase your iron absorption. Foods that are high in vitamin C include many fruits and vegetables. Some good sources are:  Fresh orange juice.  Oranges.  Strawberries.  [place]angoes.  Grapefruit.  Red bell peppers.  Green bell peppers.  Broccoli.  Potatoes with skin.  Tomato juice. Document Released: 07/08/2005 Document Revised: 02/16/2012 Document Reviewed: 05/15/2011 [facility] Patient Information 2015 [place], [place]. This information is not intended to replace advice given to you by your health care provider. Make sure you discuss any questions you have with your health care provider.

## 2015-07-19 NOTE — Progress Notes (Addendum)
Subjective:  This chart was scribed for [name], MD by Leandra Kern, Medical Scribe. This patient was seen in Room 13 and the patient's care was started at 9:30 AM.   Patient ID: Pam Mckee, female    DOB: 02-18-1971, 44 y.o.   MRN: [id number]  Chief Complaint  Patient presents with  . Follow-up    for lab work     HPI HPI Comments: Pam Mckee is a 44 y.o. female who presents to [facility] for a follow up.   Pt was seen my Bess Harvest PA-C last week for left lumbar pain with sciatica, as well as left hip and knee pain. Pt had X-rays of her Left knee three months ago, it was normal and did not show any significant arthritis. Pt was advised weight loss, back exercises, and follow up with her sports medicine physician. Placed on Naproxin and Felxeril. I saw pt 2 months ago for CPE, at that point she had bacteria vaginitis and a vaginal yeast infection, her iron was on the lower side of normal, she was anemic of Hb of 10.9.  Anemia was due to menorrhagia, but the iron supplement was causing constipation. She has hypothyroidism followed by Dr. Chalmers Cater and recently diagnosed of sleep apnea, to start on CPAP. She returned to clinic one week ago requesting repeat labs but none were ordered, so PA reviewed charts and repeated CBC and Iron level which were mildly decreased form prior. A smear showed reactive WBC and iron deficiency anemia.  Pt reports that her symptoms from her last week visit have not improved. She reports that she has started taking Flexeril, however she is taking indomethacin instead of Naproxen due to finding better relief with it. Pt notes that she has had cortisone shot in her knee previously, and she had a great relief with it. She denies numbness of weakness in the legs, changes in bowel or bladder. She reports being mildly constipated even with having fiber diet, and taking fiber gummies. Pt reports that she has not started taking Miralax due to not receiving  the prescription.  Pt also reports that she has not started on CPAP yet due to not having it delivered yet. Pt notes that she has been to sports med recently, and was advised to take calcium and vitamin C.  Pt reports that her menstrual period are very heavy, and she reports that she was last on birth control more than 19 years ago, she reports using no contraceptives since. Pt denies having a previous medical history of migraines or fibroids. Pt note that her bacteria vaginitis and a vaginal yeast infection symptoms have completely resolved. She denies any abnormal vaginal discharge or itching to the area.   Patient Active Problem List   Diagnosis Date Noted  . Left hip pain 07/10/2015  . Lumbago 07/10/2015  . Hypothyroidism 06/06/2015  . 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 Obstructive sleep apnea 05/18/2015  . Breast calcifications on mammogram 05/18/2015  . Menorrhagia with regular cycle 05/18/2015  . Left knee pain 05/18/2015  . Severe obesity (BMI >= 40) 03/15/2015  . Thyroid activity decreased 03/15/2015  . Vitamin D deficiency 03/15/2015  . Olecranon bursitis of right elbow 02/15/2015  . Essential hypertension 02/15/2015   Past Medical History  Diagnosis Date  . Seizures   . Thyroid disease     hypo  . Hypertension    History reviewed. No pertinent past surgical history. No Known Allergies Prior to  Admission medications   Medication Sig Start Date End Date Taking? Authorizing Provider  hydrochlorothiazide (HYDRODIURIL) 25 MG tablet Take 1 tablet (25 mg total) by mouth daily. 03/09/15  Yes Shawnee Knapp, MD  levothyroxine (SYNTHROID, LEVOTHROID) 88 MCG tablet Take 88 mcg by mouth daily before breakfast.   Yes Historical Provider, MD  losartan (COZAAR) 100 MG tablet Take 1 tablet (100 mg total) by mouth daily. 03/09/15  Yes Shawnee Knapp, MD  cyclobenzaprine (FLEXERIL) 5 MG tablet Take 1-2 tablets (5-10 mg total) by mouth 3 (three) times daily as needed for muscle  spasms. Patient not taking: Reported on 07/19/2015 07/10/15   Jaynee Eagles, PA-C  levothyroxine (SYNTHROID, LEVOTHROID) 50 MCG tablet Take 1 tablet (50 mcg total) by mouth daily before breakfast. Patient not taking: Reported on 07/19/2015 04/23/15   Shawnee Knapp, MD  naproxen sodium (ANAPROX DS) 550 MG tablet Take 1 tablet (550 mg total) by mouth 2 (two) times daily with a meal. Patient not taking: Reported on 07/19/2015 07/10/15   Jaynee Eagles, PA-C  polyethylene glycol powder (GLYCOLAX/MIRALAX) powder Take 17 g by mouth 2 (two) times daily as needed for mild constipation. 07/16/15   Shawnee Knapp, MD   Social History   Social History  . Marital Status: Single    Spouse Name: N/A  . Number of Children: 1  . 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 Smoking status: Never Smoker   . Smokeless tobacco: Not on file  . Alcohol Use: No  . Drug Use: No  . Sexual Activity: Yes    Birth Control/ Protection: Condom   Other Topics Concern  . Not on file   Social History Narrative   Single.Exercise 3 times/week Zumba for 1 hour   Drinks about 2-3 cups of coffee/tea a day. Education: [employer].    Review of Systems  Constitutional: Positive for activity change and fatigue. Negative for appetite change and unexpected weight change.  Respiratory: Positive for apnea.   Cardiovascular: Positive for leg swelling.  Gastrointestinal: Positive for constipation. Negative for abdominal pain, diarrhea, blood in stool and rectal pain.  Genitourinary: Positive for menstrual problem. Negative for dysuria, urgency, frequency, vaginal discharge, enuresis, difficulty urinating and vaginal pain.  Musculoskeletal: Positive for myalgias, back pain and arthralgias. Negative for joint swelling.  Skin: Negative for rash.  Neurological: Negative for weakness, numbness and headaches.  Psychiatric/Behavioral: Positive for sleep disturbance.        Objective:   Physical Exam  Constitutional: She is oriented to person, place, and time. She appears well-developed and well-nourished. No distress.  HENT:  Head: Normocephalic and atraumatic.  Eyes: EOM are normal. Pupils are equal, round, and reactive to light.  Neck: Neck supple.  Cardiovascular: Normal rate.   Pulmonary/Chest: Effort normal.  Musculoskeletal:  No point tenderness or lumbar spine of Para spinal.  Pain localizes over left SI joint. No tenderness over trochanteric bursa. Negative leg raise. Inter knee pain with full extension  Normal hip range of motion bilaterally, but pain with left internal rotation of hip, and pain with full flexion of knee.     Neurological: She is alert and oriented to person, place, and time. No cranial nerve deficit.  Skin: Skin is warm and dry.  Psychiatric: She has a normal mood and affect. Her behavior is normal.  Nursing note and vitals reviewed.  BP 126/88 mmHg  Pulse 96  Temp(Src) 98.7  F (37.1 C) (Oral)  Resp 18  Ht 5' 6.26" (1.683 m)  Wt 270 lb 6.4 oz (122.653 kg)  BMI 43.30 kg/m2  SpO2 98%  LMP 06/23/2015     Results for orders placed or performed in visit on 07/19/15  POCT urine pregnancy  Result Value Ref Range   Preg Test, Ur Negative Negative   Risks/benefits of cortisone injection reviewed and informed consent obtained.  Knee positioned at 10 deg flexion, cleaned with EtOH x 2 followed by betadine x 2.  Anesthesia w/ ethyl chloride cold spray.  Used a 21g 1 1/2in needle to inject 40mg  of Kenalog and 4cc of 1% plain lidocaine by superior lateral approach into suprapatellar bursa  without complications. Pt tolerated procedure well. No EBL.  Assessment & Plan:   1. Iron deficiency anemia - cont slow release iron qd, recheck in 3 mos  2. Menorrhagia with regular cycle   3. Left sciatic nerve pain - recommend PT, pt agrees to sched  4. Left knee pain - s/p cortisone injection today for OA which pt tolerated well  5.  Constipation, unspecified constipation type - stat miralax and fiber. Use prn senokot S  6. Obstructive sleep apnea - pt has had a hard time getting a cpap machine - numbers given from neurology notes so pt can contact [facility] to pursue getting the machine.      Orders Placed This Encounter  Procedures  . Ambulatory referral to Physical Therapy    Referral Priority:  Routine    Referral Type:  Physical Medicine    Referral Reason:  Specialty Services Required    Requested Specialty:  Physical Therapy    Number of Visits Requested:  1  . POCT urine pregnancy    Meds ordered this encounter  Medications  . levothyroxine (SYNTHROID, LEVOTHROID) 88 MCG tablet    Sig: Take 88 mcg by mouth daily before breakfast.  . triamcinolone acetonide (KENALOG-40) injection 40 mg    Sig:   . medroxyPROGESTERone (DEPO-PROVERA) injection 150 mg    Sig:   . polyethylene glycol powder (GLYCOLAX/MIRALAX) powder    Sig: Take 17 g by mouth 2 (two) times daily as needed for mild constipation.    Dispense:  500 g    Refill:  11  . indomethacin (INDOCIN) 50 MG capsule    Sig: Take 1 capsule (50 mg total) by mouth 2 (two) times daily with a meal.    Dispense:  60 capsule    Refill:  2    I personally performed the services described in this documentation, which was scribed in my presence. The recorded information has been reviewed and considered, and addended by me as needed.  [name], MD MPH

## 2015-07-23 ENCOUNTER — Other Ambulatory Visit: Payer: Self-pay | Admitting: Physician Assistant

## 2015-07-23 DIAGNOSIS — D509 Iron deficiency anemia, unspecified: Secondary | ICD-10-CM

## 2015-07-23 MED ORDER — FERROUS FUMARATE 325 (106 FE) MG PO TABS: 1.0000 | ORAL_TABLET | Freq: Three times a day (TID) | ORAL | Status: DC

## 2015-07-23 MED ORDER — DOCUSATE SODIUM 100 MG PO CAPS: 100.0000 mg | ORAL_CAPSULE | Freq: Every day | ORAL | Status: DC | PRN

## 2015-08-03 NOTE — Progress Notes (Signed)
History and physical examinations reviewed in detail with PA-Mani.  Xrays reviewed during visit. Agree with assessment and plan. Kristi Elayne Guerin, M.D. [facility] [address] [place], [place]  [address] [phone] phone [phone] fax

## 2015-09-17 ENCOUNTER — Other Ambulatory Visit: Payer: Self-pay

## 2015-09-17 DIAGNOSIS — Z1231 Encounter for screening mammogram for malignant neoplasm of breast: Secondary | ICD-10-CM

## 2015-10-03 ENCOUNTER — Ambulatory Visit: Payer: 59

## 2015-10-03 ENCOUNTER — Ambulatory Visit
Admission: RE | Admit: 2015-10-03 | Discharge: 2015-10-03 | Disposition: A | Discharge status: Home / Self Care | Payer: 59 | Source: Ambulatory Visit

## 2015-10-03 DIAGNOSIS — Z1231 Encounter for screening mammogram for malignant neoplasm of breast: Secondary | ICD-10-CM

## 2015-10-04 ENCOUNTER — Other Ambulatory Visit: Payer: Self-pay | Admitting: Family Medicine

## 2015-10-04 DIAGNOSIS — R928 Other abnormal and inconclusive findings on diagnostic imaging of breast: Secondary | ICD-10-CM

## 2015-10-12 ENCOUNTER — Telehealth: Payer: Self-pay | Admitting: Family Medicine

## 2015-10-12 ENCOUNTER — Other Ambulatory Visit: Payer: 59

## 2015-10-12 ENCOUNTER — Encounter: Payer: Self-pay | Admitting: Family Medicine

## 2015-10-12 NOTE — Telephone Encounter (Signed)
[facility]axed authorization [facility].

## 2015-10-12 NOTE — Telephone Encounter (Signed)
Yes, please give whatever authorization is necessary

## 2015-10-12 NOTE — Telephone Encounter (Signed)
Patient was referred by Dr. Brigitte Pulse to have a mammogram. They found a mass/lump and have asked to come back today at 2:30pm. They needs authorization from Dr. Leodis Rains in order for her to be seen today.   [phone] (patient requests that we call her work number)

## 2015-10-17 ENCOUNTER — Ambulatory Visit
Admission: RE | Admit: 2015-10-17 | Discharge: 2015-10-17 | Disposition: A | Discharge status: Home / Self Care | Payer: 59 | Source: Ambulatory Visit | Attending: Family Medicine | Admitting: Family Medicine

## 2015-10-17 DIAGNOSIS — R928 Other abnormal and inconclusive findings on diagnostic imaging of breast: Secondary | ICD-10-CM

## 2015-10-24 NOTE — Progress Notes (Signed)
Subjective:    Patient ID: Pam Mckee, female    DOB: 19-Dec-1970, 44 y.o.   MRN: [id number] Chief Complaint  Patient presents with  . Follow-up    Anemia    HPI  Pam Mckee is a delightful 44 yo woman here for f/u on her chronic medical conditions.  Anemia: On slow release iron qd - here for 3 mo recheck - iron def due to menorrhagia, not on Depo-Provera- so juust spotting some  indomethacin was causing severe gastritis.     OSA: started cpap - did not get cpap as she had to go to a class that she couldn't get off of work for. Through [facility] - tons of family members - send dr Rexene Alberts a note - get it through [place] or somewhere else in town that doesn't require a class.  Tried mobic and caused her feet to swell.  Sciatica - referred to PT but couldn't pay the $40 - was on [place] [facility]. Also having pain on her right elbow with flexion - pain at left SI joing - pain with straightening back up.  Constipation: started daily miralax and takes fiber supp qd as well w/ prn senokot S. - resolved  Arthralgia - s/p intraarticular left knee cortisone in 3 mos prior.  Hypothyroid: levothyroxine increased to 68mcg from 50 3 mos prior. Followed by Dr. Chalmers Cater - has an appointment next wk.  Family planning: on Depo-Provera  Depression screen [facility] 2/9 10/25/2015 07/19/2015 07/10/2015 05/18/2015 05/10/2015  Decreased Interest 0 0 0 0 0  Down, Depressed, Hopeless 0 0 0 0 0  PHQ - 2 Score 0 0 0 0 0    Past Medical History  Diagnosis Date  . Seizures ([place])   . Thyroid disease     hypo  . Hypertension    No past surgical history on file. Current Outpatient Prescriptions on File Prior to Visit  Medication Sig Dispense Refill  . hydrochlorothiazide (HYDRODIURIL) 25 MG tablet Take 1 tablet (25 mg total) by mouth daily. 90 tablet 3  . levothyroxine (SYNTHROID, LEVOTHROID) 88 MCG tablet Take 88 mcg by mouth daily before breakfast.    . losartan (COZAAR) 100 MG tablet Take 1 tablet (100 mg total) by mouth daily.  90 tablet 3   No current facility-administered medications on file prior to visit.   No Known Allergies Family History  Problem Relation Age of Onset  . Hypertension Mother   . Hypertension Brother   . Diabetes Paternal Grandfather   . Renal Disease Father    Social History   Social History  . Marital Status: Single    Spouse Name: N/A  . Number of Children: 1  . 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 Smoking status: Never Smoker   . Smokeless tobacco: None  . Alcohol Use: No  . Drug Use: No  . Sexual Activity: Yes    Birth Control/ Protection: Condom   Other Topics Concern  . None   Social History Narrative   Single.Exercise 3 times/week Zumba for 1 hour   Drinks about 2-3 cups of coffee/tea a day. Education: [employer].     Review of Systems  Constitutional: Positive for fatigue. Negative for fever, chills, diaphoresis and appetite change.  Eyes: Negative for visual disturbance.  Respiratory: Positive for apnea. Negative for cough and shortness of breath.   Cardiovascular: Positive for leg swelling. Negative for chest pain and  palpitations.  Gastrointestinal: Positive for abdominal pain and constipation.  Genitourinary: Negative for decreased urine volume and menstrual problem.  Musculoskeletal: Positive for myalgias, back pain, joint swelling and arthralgias. Negative for gait problem.  Neurological: Negative for syncope and headaches.  Hematological: Does not bruise/bleed easily.  Psychiatric/Behavioral: Positive for sleep disturbance. Negative for behavioral problems, confusion, dysphoric mood and agitation.       Objective:  BP 142/88 mmHg  Pulse 79  Temp(Src) 98.1 F (36.7 C) (Oral)  Resp 18  Ht 5' 6.26" (1.683 m)  Wt 268 lb (121.564 kg)  BMI 42.92 kg/m2  SpO2 100%  Physical Exam  Constitutional: She is oriented to person, place, and time. She appears well-developed and  well-nourished. No distress.  HENT:  Head: Normocephalic and atraumatic.  Right Ear: External ear normal.  Left Ear: External ear normal.  Eyes: Conjunctivae are normal. No scleral icterus.  Neck: Normal range of motion. Neck supple. No thyromegaly present.  Cardiovascular: Normal rate, regular rhythm, normal heart sounds and intact distal pulses.   Pulmonary/Chest: Effort normal and breath sounds normal. No respiratory distress.  Musculoskeletal: She exhibits no edema.  Lymphadenopathy:    She has no cervical adenopathy.  Neurological: She is alert and oriented to person, place, and time.  Skin: Skin is warm and dry. She is not diaphoretic. No erythema.  Psychiatric: She has a normal mood and affect. Her behavior is normal.      Assessment & Plan:   1. Essential hypertension - borderline, recheck in 3 mos  2. Vitamin D deficiency -  s/p high dose replacement prior vit D 7 mos ago  Was 32 - has completed course, recheck - restart  3. Morbid obesity, unspecified obesity type ([place])   4. Left knee pain - resolved  5. Menorrhagia with regular cycle - responding well to depo-provera - dose today, rtc 3 mos.  6. Hypothyroidism, unspecified hypothyroidism type - followed by Dr. Chalmers Cater - has appts within the next mo  7. Obstructive sleep apnea - has been unable to obtain cpap - weill see if neuro can order through another vendor.  8. Anemia, iron deficiency - tolerating new slow release iron supp qd well - restart  9. Medication monitoring encounter   10. Left low back pain, with sciatica presence unspecified - lumbar pain resolved but new periodic sensation change in Right  Medial thigh - watchful waiting. RTC if numbness/weakness progresses at all  11. Olecranon bursitis, right - occ pain since bursitis asperation prior  12. Chronic left SI joint pain  - cont top chemical heat, try ice/heat w/ pressure/massage, retry qhs flexeril. Refer to [facility].  Cons ortho referral if sxs persist  13.  Family planning - recheck in 3 mos for Depo-Provera   CPE done 05/2015  Orders Placed This Encounter  Procedures  . CBC  . TSH  . Ferritin  . VITAMIN D 25 Hydroxy (Vit-D Deficiency, Fractures)  . Comprehensive metabolic panel  . Ambulatory referral to Physical Therapy    Referral Priority:  Routine    Referral Type:  Physical Medicine    Referral Reason:  Specialty Services Required    Requested Specialty:  Physical Therapy    Number of Visits Requested:  1    Meds ordered this encounter  Medications  . cyclobenzaprine (FLEXERIL) 10 MG tablet    Sig: Take 1 tablet (10 mg total) by mouth at bedtime.    Dispense:  30 tablet    Refill:  1  .  medroxyPROGESTERone (DEPO-PROVERA) injection 150 mg    Sig:   . medroxyPROGESTERone (DEPO-PROVERA) injection 150 mg    Sig:   . ergocalciferol (VITAMIN D2) 50000 UNITS capsule    Sig: Take 1 capsule (50,000 Units total) by mouth once a week.    Dispense:  12 capsule    Refill:  1  . iron polysaccharides (NIFEREX) 150 MG capsule    Sig: Take 1 capsule (150 mg total) by mouth daily.    Dispense:  90 capsule    Refill:  1    [name], MD MPH

## 2015-10-25 ENCOUNTER — Encounter: Payer: Self-pay | Admitting: Family Medicine

## 2015-10-25 ENCOUNTER — Ambulatory Visit (INDEPENDENT_AMBULATORY_CARE_PROVIDER_SITE_OTHER): Payer: 59 | Admitting: Family Medicine

## 2015-10-25 VITALS — BP 142/88 | HR 79 | Temp 98.1°F | Resp 18 | Ht 66.26 in | Wt 268.0 lb

## 2015-10-25 DIAGNOSIS — M545 Low back pain: Secondary | ICD-10-CM

## 2015-10-25 DIAGNOSIS — G8929 Other chronic pain: Secondary | ICD-10-CM

## 2015-10-25 DIAGNOSIS — G4733 Obstructive sleep apnea (adult) (pediatric): Secondary | ICD-10-CM | POA: Diagnosis not present

## 2015-10-25 DIAGNOSIS — Z5181 Encounter for therapeutic drug level monitoring: Secondary | ICD-10-CM | POA: Diagnosis not present

## 2015-10-25 DIAGNOSIS — N920 Excessive and frequent menstruation with regular cycle: Secondary | ICD-10-CM

## 2015-10-25 DIAGNOSIS — E039 Hypothyroidism, unspecified: Secondary | ICD-10-CM

## 2015-10-25 DIAGNOSIS — M533 Sacrococcygeal disorders, not elsewhere classified: Secondary | ICD-10-CM

## 2015-10-25 DIAGNOSIS — D509 Iron deficiency anemia, unspecified: Secondary | ICD-10-CM | POA: Diagnosis not present

## 2015-10-25 DIAGNOSIS — M7021 Olecranon bursitis, right elbow: Secondary | ICD-10-CM

## 2015-10-25 DIAGNOSIS — Z3009 Encounter for other general counseling and advice on contraception: Secondary | ICD-10-CM

## 2015-10-25 DIAGNOSIS — I10 Essential (primary) hypertension: Secondary | ICD-10-CM | POA: Diagnosis not present

## 2015-10-25 DIAGNOSIS — M25562 Pain in left knee: Secondary | ICD-10-CM

## 2015-10-25 DIAGNOSIS — E559 Vitamin D deficiency, unspecified: Secondary | ICD-10-CM | POA: Diagnosis not present

## 2015-10-25 LAB — COMPREHENSIVE METABOLIC PANEL
ALT: 13 U/L (ref 6–29)
AST: 17 U/L (ref 10–30)
Albumin: 4 g/dL (ref 3.6–5.1)
Alkaline Phosphatase: 73 U/L (ref 33–115)
BUN: 18 mg/dL (ref 7–25)
CO2: 26 mmol/L (ref 20–31)
Calcium: 9.3 mg/dL (ref 8.6–10.2)
Chloride: 101 mmol/L (ref 98–110)
Creat: 0.67 mg/dL (ref 0.50–1.10)
Glucose, Bld: 81 mg/dL (ref 65–99)
Potassium: 3.4 mmol/L — ABNORMAL LOW (ref 3.5–5.3)
Sodium: 137 mmol/L (ref 135–146)
Total Bilirubin: 0.4 mg/dL (ref 0.2–1.2)
Total Protein: 7.4 g/dL (ref 6.1–8.1)

## 2015-10-25 LAB — CBC
HCT: 33.5 % — ABNORMAL LOW (ref 36.0–46.0)
Hemoglobin: 10.6 g/dL — ABNORMAL LOW (ref 12.0–15.0)
MCH: 23.6 pg — ABNORMAL LOW (ref 26.0–34.0)
MCHC: 31.6 g/dL (ref 30.0–36.0)
MCV: 74.6 fL — ABNORMAL LOW (ref 78.0–100.0)
MPV: 10.2 fL (ref 8.6–12.4)
Platelets: 357 10*3/uL (ref 150–400)
RBC: 4.49 MIL/uL (ref 3.87–5.11)
RDW: 14.9 % (ref 11.5–15.5)
WBC: 9.5 10*3/uL (ref 4.0–10.5)

## 2015-10-25 LAB — FERRITIN: Ferritin: 33 ng/mL (ref 10–291)

## 2015-10-25 LAB — TSH: TSH: 1.283 u[IU]/mL (ref 0.350–4.500)

## 2015-10-25 MED ORDER — CYCLOBENZAPRINE HCL 10 MG PO TABS: 10.0000 mg | ORAL_TABLET | Freq: Every day | ORAL | Status: DC

## 2015-10-25 MED ORDER — MEDROXYPROGESTERONE ACETATE 150 MG/ML IM SUSP
150.0000 mg | Freq: Once | INTRAMUSCULAR | Status: AC
  Administered 2015-10-25: 150 mg via INTRAMUSCULAR

## 2015-10-25 MED ORDER — MEDROXYPROGESTERONE ACETATE 150 MG/ML IM SUSP: 150.0000 mg | INTRAMUSCULAR | Status: AC

## 2015-10-26 LAB — VITAMIN D 25 HYDROXY (VIT D DEFICIENCY, FRACTURES): Vit D, 25-Hydroxy: 17 ng/mL — ABNORMAL LOW (ref 30–100)

## 2015-10-28 MED ORDER — ERGOCALCIFEROL 1.25 MG (50000 UT) PO CAPS: 50000.0000 [IU] | ORAL_CAPSULE | ORAL | Status: DC

## 2015-10-28 MED ORDER — POLYSACCHARIDE IRON COMPLEX 150 MG PO CAPS: 150.0000 mg | ORAL_CAPSULE | Freq: Every day | ORAL | Status: DC

## 2015-10-29 NOTE — Progress Notes (Signed)
Appointment made on 01/24/16 @ 4pm for a 3 month reck and depo shot.  Pam Mckee

## 2015-10-31 ENCOUNTER — Ambulatory Visit
Admission: RE | Admit: 2015-10-31 | Discharge: 2015-10-31 | Disposition: A | Discharge status: Home / Self Care | Payer: 59 | Source: Ambulatory Visit | Attending: Endocrinology | Admitting: Endocrinology

## 2015-10-31 DIAGNOSIS — E041 Nontoxic single thyroid nodule: Secondary | ICD-10-CM

## 2015-12-09 DIAGNOSIS — Z8249 Family history of ischemic heart disease and other diseases of the circulatory system: Secondary | ICD-10-CM | POA: Insufficient documentation

## 2015-12-09 DIAGNOSIS — S83249A Other tear of medial meniscus, current injury, unspecified knee, initial encounter: Secondary | ICD-10-CM | POA: Insufficient documentation

## 2015-12-11 MED FILL — LEVOTHYROXINE 112 MCG TAB: 112 | 30 days supply | Qty: 30 | Fill #1

## 2015-12-20 MED FILL — VIT D2 1.25 MG (50,000 UNIT: 1.25 MG | 84 days supply | Qty: 12 | Fill #0

## 2015-12-21 ENCOUNTER — Encounter: Payer: Self-pay | Admitting: Family Medicine

## 2015-12-21 MED FILL — POLYETHYLENE GLYCOL 3350 PO: 15 days supply | Qty: 527 | Fill #1

## 2015-12-30 DIAGNOSIS — G4733 Obstructive sleep apnea (adult) (pediatric): Secondary | ICD-10-CM | POA: Diagnosis not present

## 2016-01-08 MED FILL — LEVOTHYROXINE 112 MCG TAB: 112 | 30 days supply | Qty: 30 | Fill #2

## 2016-01-12 ENCOUNTER — Encounter: Payer: 59 | Attending: Family Medicine | Admitting: Dietician

## 2016-01-12 DIAGNOSIS — Z713 Dietary counseling and surveillance: Secondary | ICD-10-CM | POA: Insufficient documentation

## 2016-01-12 NOTE — Progress Notes (Signed)
Patient was seen on 01/12/16 for the Weight Loss Class at [facility]. The following learning objectives were met by the patient during this class:   Describe healthy choices in each food group  Describe portion size of foods  Use plate method for meal planning  Demonstrate how to read Nutrition Facts food label  Set realistic goals for weight loss, diet changes, and physical activity.   Goals:  1. Make healthy food choices in each food group.  2. Reduce portion size of foods.  3. Increase fruit and vegetable intake.  4. Use plate method for meal planning.  5. Increase physical activity.    Handouts given:   1. Nutrition Strategies for Weight Loss   2. Meal plan/portion card   3. MyPlate Planner   4. Weight Management Recipe Resources   5. [employer], [place], [place]

## 2016-01-14 ENCOUNTER — Ambulatory Visit (INDEPENDENT_AMBULATORY_CARE_PROVIDER_SITE_OTHER): Payer: 59 | Admitting: Family Medicine

## 2016-01-14 VITALS — BP 142/86 | HR 69 | Temp 98.6°F | Resp 16 | Ht 66.0 in | Wt 259.2 lb

## 2016-01-14 DIAGNOSIS — I10 Essential (primary) hypertension: Secondary | ICD-10-CM | POA: Diagnosis not present

## 2016-01-14 DIAGNOSIS — M5442 Lumbago with sciatica, left side: Secondary | ICD-10-CM | POA: Diagnosis not present

## 2016-01-14 DIAGNOSIS — R209 Unspecified disturbances of skin sensation: Secondary | ICD-10-CM | POA: Diagnosis not present

## 2016-01-14 DIAGNOSIS — E559 Vitamin D deficiency, unspecified: Secondary | ICD-10-CM

## 2016-01-14 DIAGNOSIS — R202 Paresthesia of skin: Secondary | ICD-10-CM | POA: Diagnosis not present

## 2016-01-14 DIAGNOSIS — M5430 Sciatica, unspecified side: Secondary | ICD-10-CM | POA: Diagnosis not present

## 2016-01-14 LAB — COMPLETE METABOLIC PANEL WITH GFR
ALT: 14 U/L (ref 6–29)
AST: 16 U/L (ref 10–30)
Albumin: 4 g/dL (ref 3.6–5.1)
Alkaline Phosphatase: 68 U/L (ref 33–115)
BUN: 19 mg/dL (ref 7–25)
CO2: 26 mmol/L (ref 20–31)
Calcium: 9.3 mg/dL (ref 8.6–10.2)
Chloride: 104 mmol/L (ref 98–110)
Creat: 0.7 mg/dL (ref 0.50–1.10)
GFR, Est African American: >89 mL/min (ref >=60)
GFR, Est Non African American: >89 mL/min (ref >=60)
Glucose, Bld: 78 mg/dL (ref 65–99)
Potassium: 3.5 mmol/L (ref 3.5–5.3)
Sodium: 140 mmol/L (ref 135–146)
Total Bilirubin: 0.4 mg/dL (ref 0.2–1.2)
Total Protein: 7.4 g/dL (ref 6.1–8.1)

## 2016-01-14 LAB — POCT GLYCOSYLATED HEMOGLOBIN (HGB A1C): Hemoglobin A1C: 5.5

## 2016-01-14 LAB — VITAMIN B12: Vitamin B-12: 323 pg/mL (ref 200–1100)

## 2016-01-14 MED ORDER — METHYLPREDNISOLONE 4 MG PO TBPK
ORAL_TABLET | ORAL | Status: DC
Start: 2016-01-14 — End: 2016-01-24

## 2016-01-14 MED ORDER — HYDROCODONE-ACETAMINOPHEN 5-325 MG PO TABS: 1.0000 | ORAL_TABLET | Freq: Three times a day (TID) | ORAL | Status: DC | PRN

## 2016-01-14 MED FILL — METHYLPREDNISOLONE 4 MG TAB: 4 | 6 days supply | Qty: 21 | Fill #0

## 2016-01-14 MED FILL — HYDROCODON-APAP 5-325: 5-325 | 10 days supply | Qty: 30 | Fill #0

## 2016-01-14 NOTE — Progress Notes (Signed)
Chief Complaint:  Chief Complaint  Patient presents with  . pain in her upper legs    on going for awhile, but today is worst then usual, burning sensation, some numbness    HPI: Pam Mckee is a 45 y.o. female who reports to [facility] today complaining of burning pain in both legs , more on left than right , felt "like water was dripping" down her leg since Nov 2016.She has had this for several months. She has had burning pain in the anterior thigh of her leg. 7/10 pain, sharp, lasts about 5-10 min, gets about >10 episodes daily , night time worse , she has had some new activities. Has taken flexeril without releif. She states when she lays down it is worse, only in her back and on left side. Generally  Left side worse. Prior MRI shows DJD. No incontinence. No weakness.  CLINICAL DATA: Left lower back pain, sciatica. Symptoms for 2 months, sharp and intermittent, worse at night when she lays down.  EXAM: LUMBAR SPINE - COMPLETE 4+ VIEW  COMPARISON: None.  FINDINGS: Mild diffuse degenerative disc disease with disc space narrowing and spurring. Mild degenerative facet disease from L3-4 through L5-S1. Transitional anatomy at the lumbosacral junction with partial sacralization of L5. No fracture or subluxation. SI joints are symmetric and unremarkable.  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 No acute findings.   Electronically Signed  By: Rolm Baptise M.D.  On: 07/11/2015 13:14  Past Medical History  Diagnosis Date  . Seizures ([place])   . Thyroid disease     hypo  . Hypertension   . OSA on CPAP    History reviewed. No pertinent past surgical history. Social History   Social History  . Marital Status: Single    Spouse Name: N/A  . Number of Children: 1  . 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 Smoking status: Never Smoker   . Smokeless tobacco:  None  . Alcohol Use: No  . Drug Use: No  . Sexual Activity: Yes    Birth Control/ Protection: Condom   Other Topics Concern  . None   Social History Narrative   Single.Exercise 3 times/week Zumba for 1 hour   Drinks about 2-3 cups of coffee/tea a day. Education: [employer].   Family History  Problem Relation Age of Onset  . Hypertension Mother   . Hypertension Brother   . Diabetes Paternal Grandfather   . Renal Disease Father    No Known Allergies Prior to Admission medications   Medication Sig Start Date End Date Taking? Authorizing Provider  hydrochlorothiazide (HYDRODIURIL) 25 MG tablet Take 1 tablet (25 mg total) by mouth daily. 03/09/15  Yes Shawnee Knapp, MD  levothyroxine (SYNTHROID, LEVOTHROID) 88 MCG tablet Take 88 mcg by mouth daily before breakfast.   Yes Historical Provider, MD  losartan (COZAAR) 100 MG tablet Take 1 tablet (100 mg total) by mouth daily. 03/09/15  Yes Shawnee Knapp, MD  cyclobenzaprine (FLEXERIL) 10 MG tablet Take 1 tablet (10 mg total) by mouth at bedtime. Patient not taking: Reported on 01/14/2016 10/25/15   Shawnee Knapp, MD  ergocalciferol (VITAMIN D2) 50000 UNITS capsule Take 1 capsule (50,000 Units total) by mouth once a week. Patient not taking: Reported on 01/14/2016 10/28/15   Shawnee Knapp, MD  iron polysaccharides (NIFEREX) 150 MG capsule Take 1 capsule (  150 mg total) by mouth daily. Patient not taking: Reported on 01/14/2016 10/28/15   Shawnee Knapp, MD     ROS: The patient denies fevers, chills, night sweats, unintentional weight loss, chest pain, palpitations, wheezing, dyspnea on exertion, nausea, vomiting, abdominal pain, dysuria, hematuria, melena, weakness All other systems have been reviewed and were otherwise negative with the exception of those mentioned in the HPI and as above.    PHYSICAL EXAM: Filed Vitals:   01/14/16 1247 01/14/16 1317  BP: 138/94 142/86  Pulse: 72 69  Temp: 98.6 F (37 C)   Resp: 16    Body mass index is 41.86  kg/(m^2).   General: Alert, no acute distress HEENT:  Normocephalic, atraumatic, oropharynx patent. EOMI, PERRLA Cardiovascular:  Regular rate and rhythm, no rubs murmurs or gallops.  No Carotid bruits, radial pulse intact. No pedal edema.  Respiratory: Clear to auscultation bilaterally.  No wheezes, rales, or rhonchi.  No cyanosis, no use of accessory musculature Abdominal: No organomegaly, abdomen is soft and non-tender, positive bowel sounds. No masses. Skin: No rashes. Neurologic: Facial musculature symmetric. Psychiatric: Patient acts appropriately throughout our interaction. Lymphatic: No cervical or submandibular lymphadenopathy Musculoskeletal: Gait intact. No edema, tenderness + paramsk tenderness  Full ROM 5/5 strength, 2/2 DTRs No saddle anesthesia Straight leg left side +  Hip and knee exam--normal      LABS: Results for orders placed or performed in visit on 01/14/16  POCT glycosylated hemoglobin (Hb A1C)  Result Value Ref Range   Hemoglobin A1C 5.5      EKG/XRAY:   Primary read interpreted by Dr. Marin Comment at [facility].   ASSESSMENT/PLAN: Encounter Diagnoses  Name Primary?  . Essential hypertension Yes  . Vitamin D deficiency   . Midline low back pain with left-sided sciatica   . Paresthesia    No DM , likely radicular pain from L spine, will get MRI of L spine She has a hx of seizures from 8-16, not on meds was on  Phenobarbital and then on Dilantin. So will not give tramadol.  She has tried taking indomethacine without releif, it hurts her stomach, she ahs tried [place] prior and that worked better. She also has had prednisone without stomach issues.  Rx prednisone, norco prn , she can use flexeril if actually helps that she already has at home I will not give gabapentin until we find out etiology of parasthesia MRI lumbar spine without pending  Gross sideeffects, risk and benefits, and alternatives of medications d/w patient. Patient is aware that all medications  have potential sideeffects and we are unable to predict every sideeffect or drug-drug interaction that may occur.  Federica Allport DO  01/14/2016 1:46 PM

## 2016-01-14 NOTE — Patient Instructions (Signed)
Sciatica With Rehab The sciatic nerve runs from the back down the leg and is responsible for sensation and control of the muscles in the back (posterior) side of the thigh, lower leg, and foot. Sciatica is a condition that is characterized by inflammation of this nerve.  SYMPTOMS   Signs of nerve damage, including numbness and/or weakness along the posterior side of the lower extremity.  Pain in the back of the thigh that may also travel down the leg.  Pain that worsens when sitting for long periods of time.  Occasionally, pain in the back or buttock. CAUSES  Inflammation of the sciatic nerve is the cause of sciatica. The inflammation is due to something irritating the nerve. Common sources of irritation include:  Sitting for long periods of time.  Direct trauma to the nerve.  Arthritis of the spine.  Herniated or ruptured disk.  Slipping of the vertebrae (spondylolisthesis).  Pressure from soft tissues, such as muscles or ligament-like tissue (fascia). RISK INCREASES WITH:  Sports that place pressure or stress on the spine (football or weightlifting).  Poor strength and flexibility.  Failure to warm up properly before activity.  Family history of low back pain or disk disorders.  Previous back injury or surgery.  Poor body mechanics, especially when lifting, or poor posture. PREVENTION   Warm up and stretch properly before activity.  Maintain physical fitness:  Strength, flexibility, and endurance.  Cardiovascular fitness.  Learn and use proper technique, especially with posture and lifting. When possible, have coach correct improper technique.  Avoid activities that place stress on the spine. PROGNOSIS If treated properly, then sciatica usually resolves within 6 weeks. However, occasionally surgery is necessary.  RELATED COMPLICATIONS   Permanent nerve damage, including pain, numbness, tingle, or weakness.  Chronic back pain.  Risks of surgery: infection,  bleeding, nerve damage, or damage to surrounding tissues. TREATMENT Treatment initially involves resting from any activities that aggravate your symptoms. The use of ice and medication may help reduce pain and inflammation. The use of strengthening and stretching exercises may help reduce pain with activity. These exercises may be performed at home or with referral to a therapist. A therapist may recommend further treatments, such as transcutaneous electronic nerve stimulation (TENS) or ultrasound. Your caregiver may recommend corticosteroid injections to help reduce inflammation of the sciatic nerve. If symptoms persist despite non-surgical (conservative) treatment, then surgery may be recommended. MEDICATION  If pain medication is necessary, then nonsteroidal anti-inflammatory medications, such as aspirin and ibuprofen, or other minor pain relievers, such as acetaminophen, are often recommended.  Do not take pain medication for 7 days before surgery.  Prescription pain relievers may be given if deemed necessary by your caregiver. Use only as directed and only as much as you need.  Ointments applied to the skin may be helpful.  Corticosteroid injections may be given by your caregiver. These injections should be reserved for the most serious cases, because they may only be given a certain number of times. HEAT AND COLD  Cold treatment (icing) relieves pain and reduces inflammation. Cold treatment should be applied for 10 to 15 minutes every 2 to 3 hours for inflammation and pain and immediately after any activity that aggravates your symptoms. Use ice packs or massage the area with a piece of ice (ice massage).  Heat treatment may be used prior to performing the stretching and strengthening activities prescribed by your caregiver, physical therapist, or athletic trainer. Use a heat pack or soak the injury in warm water.   SEEK MEDICAL CARE IF:  Treatment seems to offer no benefit, or the condition  worsens.  Any medications produce adverse side effects. EXERCISES  RANGE OF MOTION (ROM) AND STRETCHING EXERCISES - Sciatica Most people with sciatic will find that their symptoms worsen with either excessive bending forward (flexion) or arching at the low back (extension). The exercises which will help resolve your symptoms will focus on the opposite motion. Your physician, physical therapist or athletic trainer will help you determine which exercises will be most helpful to resolve your low back pain. Do not complete any exercises without first consulting with your clinician. Discontinue any exercises which worsen your symptoms until you speak to your clinician. If you have pain, numbness or tingling which travels down into your buttocks, leg or foot, the goal of the therapy is for these symptoms to move closer to your back and eventually resolve. Occasionally, these leg symptoms will get better, but your low back pain may worsen; this is typically an indication of progress in your rehabilitation. Be certain to be very alert to any changes in your symptoms and the activities in which you participated in the 24 hours prior to the change. Sharing this information with your clinician will allow him/her to most efficiently treat your condition. These exercises may help you when beginning to rehabilitate your injury. Your symptoms may resolve with or without further involvement from your physician, physical therapist or athletic trainer. While completing these exercises, remember:   Restoring tissue flexibility helps normal motion to return to the joints. This allows healthier, less painful movement and activity.  An effective stretch should be held for at least 30 seconds.  A stretch should never be painful. You should only feel a gentle lengthening or release in the stretched tissue. FLEXION RANGE OF MOTION AND STRETCHING EXERCISES: STRETCH - Flexion, Single Knee to Chest   Lie on a firm bed or floor  with both legs extended in front of you.  Keeping one leg in contact with the floor, bring your opposite knee to your chest. Hold your leg in place by either grabbing behind your thigh or at your knee.  Pull until you feel a gentle stretch in your low back. Hold __________ seconds.  Slowly release your grasp and repeat the exercise with the opposite side. Repeat __________ times. Complete this exercise __________ times per day.  STRETCH - Flexion, Double Knee to Chest  Lie on a firm bed or floor with both legs extended in front of you.  Keeping one leg in contact with the floor, bring your opposite knee to your chest.  Tense your stomach muscles to support your back and then lift your other knee to your chest. Hold your legs in place by either grabbing behind your thighs or at your knees.  Pull both knees toward your chest until you feel a gentle stretch in your low back. Hold __________ seconds.  Tense your stomach muscles and slowly return one leg at a time to the floor. Repeat __________ times. Complete this exercise __________ times per day.  STRETCH - Low Trunk Rotation   Lie on a firm bed or floor. Keeping your legs in front of you, bend your knees so they are both pointed toward the ceiling and your feet are flat on the floor.  Extend your arms out to the side. This will stabilize your upper body by keeping your shoulders in contact with the floor.  Gently and slowly drop both knees together to one side until   you feel a gentle stretch in your low back. Hold for __________ seconds.  Tense your stomach muscles to support your low back as you bring your knees back to the starting position. Repeat the exercise to the other side. Repeat __________ times. Complete this exercise __________ times per day  EXTENSION RANGE OF MOTION AND FLEXIBILITY EXERCISES: STRETCH - Extension, Prone on Elbows  Lie on your stomach on the floor, a bed will be too soft. Place your palms about shoulder  width apart and at the height of your head.  Place your elbows under your shoulders. If this is too painful, stack pillows under your chest.  Allow your body to relax so that your hips drop lower and make contact more completely with the floor.  Hold this position for __________ seconds.  Slowly return to lying flat on the floor. Repeat __________ times. Complete this exercise __________ times per day.  RANGE OF MOTION - Extension, Prone Press Ups  Lie on your stomach on the floor, a bed will be too soft. Place your palms about shoulder width apart and at the height of your head.  Keeping your back as relaxed as possible, slowly straighten your elbows while keeping your hips on the floor. You may adjust the placement of your hands to maximize your comfort. As you gain motion, your hands will come more underneath your shoulders.  Hold this position __________ seconds.  Slowly return to lying flat on the floor. Repeat __________ times. Complete this exercise __________ times per day.  STRENGTHENING EXERCISES - Sciatica  These exercises may help you when beginning to rehabilitate your injury. These exercises should be done near your "sweet spot." This is the neutral, low-back arch, somewhere between fully rounded and fully arched, that is your least painful position. When performed in this safe range of motion, these exercises can be used for people who have either a flexion or extension based injury. These exercises may resolve your symptoms with or without further involvement from your physician, physical therapist or athletic trainer. While completing these exercises, remember:   Muscles can gain both the endurance and the strength needed for everyday activities through controlled exercises.  Complete these exercises as instructed by your physician, physical therapist or athletic trainer. Progress with the resistance and repetition exercises only as your caregiver advises.  You may  experience muscle soreness or fatigue, but the pain or discomfort you are trying to eliminate should never worsen during these exercises. If this pain does worsen, stop and make certain you are following the directions exactly. If the pain is still present after adjustments, discontinue the exercise until you can discuss the trouble with your clinician. STRENGTHENING - Deep Abdominals, Pelvic Tilt   Lie on a firm bed or floor. Keeping your legs in front of you, bend your knees so they are both pointed toward the ceiling and your feet are flat on the floor.  Tense your lower abdominal muscles to press your low back into the floor. This motion will rotate your pelvis so that your tail bone is scooping upwards rather than pointing at your feet or into the floor.  With a gentle tension and even breathing, hold this position for __________ seconds. Repeat __________ times. Complete this exercise __________ times per day.  STRENGTHENING - Abdominals, Crunches   Lie on a firm bed or floor. Keeping your legs in front of you, bend your knees so they are both pointed toward the ceiling and your feet are flat on the   floor. Cross your arms over your chest.  Slightly tip your chin down without bending your neck.  Tense your abdominals and slowly lift your trunk high enough to just clear your shoulder blades. Lifting higher can put excessive stress on the low back and does not further strengthen your abdominal muscles.  Control your return to the starting position. Repeat __________ times. Complete this exercise __________ times per day.  STRENGTHENING - Quadruped, Opposite UE/Tennyson Wacha Lift  Assume a hands and knees position on a firm surface. Keep your hands under your shoulders and your knees under your hips. You may place padding under your knees for comfort.  Find your neutral spine and gently tense your abdominal muscles so that you can maintain this position. Your shoulders and hips should form a rectangle  that is parallel with the floor and is not twisted.  Keeping your trunk steady, lift your right hand no higher than your shoulder and then your left leg no higher than your hip. Make sure you are not holding your breath. Hold this position __________ seconds.  Continuing to keep your abdominal muscles tense and your back steady, slowly return to your starting position. Repeat with the opposite arm and leg. Repeat __________ times. Complete this exercise __________ times per day.  STRENGTHENING - Abdominals and Quadriceps, Straight Leg Raise   Lie on a firm bed or floor with both legs extended in front of you.  Keeping one leg in contact with the floor, bend the other knee so that your foot can rest flat on the floor.  Find your neutral spine, and tense your abdominal muscles to maintain your spinal position throughout the exercise.  Slowly lift your straight leg off the floor about 6 inches for a count of 15, making sure to not hold your breath.  Still keeping your neutral spine, slowly lower your leg all the way to the floor. Repeat this exercise with each leg __________ times. Complete this exercise __________ times per day. POSTURE AND BODY MECHANICS CONSIDERATIONS - Sciatica Keeping correct posture when sitting, standing or completing your activities will reduce the stress put on different body tissues, allowing injured tissues a chance to heal and limiting painful experiences. The following are general guidelines for improved posture. Your physician or physical therapist will provide you with any instructions specific to your needs. While reading these guidelines, remember:  The exercises prescribed by your provider will help you have the flexibility and strength to maintain correct postures.  The correct posture provides the optimal environment for your joints to work. All of your joints have less wear and tear when properly supported by a spine with good posture. This means you will  experience a healthier, less painful body.  Correct posture must be practiced with all of your activities, especially prolonged sitting and standing. Correct posture is as important when doing repetitive low-stress activities (typing) as it is when doing a single heavy-load activity (lifting). RESTING POSITIONS Consider which positions are most painful for you when choosing a resting position. If you have pain with flexion-based activities (sitting, bending, stooping, squatting), choose a position that allows you to rest in a less flexed posture. You would want to avoid curling into a fetal position on your side. If your pain worsens with extension-based activities (prolonged standing, working overhead), avoid resting in an extended position such as sleeping on your stomach. Most people will find more comfort when they rest with their spine in a more neutral position, neither too rounded nor too   arched. Lying on a non-sagging bed on your side with a pillow between your knees, or on your back with a pillow under your knees will often provide some relief. Keep in mind, being in any one position for a prolonged period of time, no matter how correct your posture, can still lead to stiffness. PROPER SITTING POSTURE In order to minimize stress and discomfort on your spine, you must sit with correct posture Sitting with good posture should be effortless for a healthy body. Returning to good posture is a gradual process. Many people can work toward this most comfortably by using various supports until they have the flexibility and strength to maintain this posture on their own. When sitting with proper posture, your ears will fall over your shoulders and your shoulders will fall over your hips. You should use the back of the chair to support your upper back. Your low back will be in a neutral position, just slightly arched. You may place a small pillow or folded towel at the base of your low back for support.  When  working at a desk, create an environment that supports good, upright posture. Without extra support, muscles fatigue and lead to excessive strain on joints and other tissues. Keep these recommendations in mind: CHAIR:   A chair should be able to slide under your desk when your back makes contact with the back of the chair. This allows you to work closely.  The chair's height should allow your eyes to be level with the upper part of your monitor and your hands to be slightly lower than your elbows. BODY POSITION  Your feet should make contact with the floor. If this is not possible, use a foot rest.  Keep your ears over your shoulders. This will reduce stress on your neck and low back. INCORRECT SITTING POSTURES   If you are feeling tired and unable to assume a healthy sitting posture, do not slouch or slump. This puts excessive strain on your back tissues, causing more damage and pain. Healthier options include:  Using more support, like a lumbar pillow.  Switching tasks to something that requires you to be upright or walking.  Talking a brief walk.  Lying down to rest in a neutral-spine position. PROLONGED STANDING WHILE SLIGHTLY LEANING FORWARD  When completing a task that requires you to lean forward while standing in one place for a long time, place either foot up on a stationary 2-4 inch high object to help maintain the best posture. When both feet are on the ground, the low back tends to lose its slight inward curve. If this curve flattens (or becomes too large), then the back and your other joints will experience too much stress, fatigue more quickly and can cause pain.  CORRECT STANDING POSTURES Proper standing posture should be assumed with all daily activities, even if they only take a few moments, like when brushing your teeth. As in sitting, your ears should fall over your shoulders and your shoulders should fall over your hips. You should keep a slight tension in your abdominal  muscles to brace your spine. Your tailbone should point down to the ground, not behind your body, resulting in an over-extended swayback posture.  INCORRECT STANDING POSTURES  Common incorrect standing postures include a forward head, locked knees and/or an excessive swayback. WALKING Walk with an upright posture. Your ears, shoulders and hips should all line-up. PROLONGED ACTIVITY IN A FLEXED POSITION When completing a task that requires you to bend forward   at your waist or lean over a low surface, try to find a way to stabilize 3 of 4 of your limbs. You can place a hand or elbow on your thigh or rest a knee on the surface you are reaching across. This will provide you more stability so that your muscles do not fatigue as quickly. By keeping your knees relaxed, or slightly bent, you will also reduce stress across your low back. CORRECT LIFTING TECHNIQUES DO :   Assume a wide stance. This will provide you more stability and the opportunity to get as close as possible to the object which you are lifting.  Tense your abdominals to brace your spine; then bend at the knees and hips. Keeping your back locked in a neutral-spine position, lift using your leg muscles. Lift with your legs, keeping your back straight.  Test the weight of unknown objects before attempting to lift them.  Try to keep your elbows locked down at your sides in order get the best strength from your shoulders when carrying an object.  Always ask for help when lifting heavy or awkward objects. INCORRECT LIFTING TECHNIQUES DO NOT:   Lock your knees when lifting, even if it is a small object.  Bend and twist. Pivot at your feet or move your feet when needing to change directions.  Assume that you cannot safely pick up a paperclip without proper posture.   This information is not intended to replace advice given to you by your health care provider. Make sure you discuss any questions you have with your health care provider.     Document Released: 11/24/2005 Document Revised: 04/10/2015 Document Reviewed: 03/08/2009 [employer] Interactive Patient Education 2016 [employer]    Acetaminophen; Hydrocodone tablets or capsules What is this medicine? ACETAMINOPHEN; HYDROCODONE (a set a MEE noe fen; hye droe KOE done) is a pain reliever. It is used to treat moderate to severe pain. This medicine may be used for other purposes; ask your health care provider or pharmacist if you have questions. What should I tell my health care provider before I take this medicine? They need to know if you have any of these conditions: -brain tumor -Crohn's disease, inflammatory bowel disease, or ulcerative colitis -drug abuse or addiction -head injury -heart or circulation problems -if you often drink alcohol -kidney disease or problems going to the bathroom -liver disease -lung disease, asthma, or breathing problems -an unusual or allergic reaction to acetaminophen, hydrocodone, other opioid analgesics, other medicines, foods, dyes, or preservatives -pregnant or trying to get pregnant -breast-feeding How should I use this medicine? Take this medicine by mouth. Swallow it with a full glass of water. Follow the directions on the prescription label. If the medicine upsets your stomach, take the medicine with food or milk. Do not take more than you are told to take. Talk to your pediatrician regarding the use of this medicine in children. This medicine is not approved for use in children. Patients over 65 years may have a stronger reaction and need a smaller dose. Overdosage: If you think you have taken too much of this medicine contact a poison control center or emergency room at once. NOTE: This medicine is only for you. Do not share this medicine with others. What if I miss a dose? If you miss a dose, take it as soon as you can. If it is almost time for your next dose, take only that dose. Do not take double or extra doses. What  may interact with this  medicine? -alcohol -antihistamines -isoniazid -medicines for depression, anxiety, or psychotic disturbances -medicines for sleep -muscle relaxants -naltrexone -narcotic medicines (opiates) for pain -phenobarbital -ritonavir -tramadol This list may not describe all possible interactions. Give your health care provider a list of all the medicines, herbs, non-prescription drugs, or dietary supplements you use. Also tell them if you smoke, drink alcohol, or use illegal drugs. Some items may interact with your medicine. What should I watch for while using this medicine? Tell your doctor or health care professional if your pain does not go away, if it gets worse, or if you have new or a different type of pain. You may develop tolerance to the medicine. Tolerance means that you will need a higher dose of the medicine for pain relief. Tolerance is normal and is expected if you take the medicine for a long time. Do not suddenly stop taking your medicine because you may develop a severe reaction. Your body becomes used to the medicine. This does NOT mean you are addicted. Addiction is a behavior related to getting and using a drug for a non-medical reason. If you have pain, you have a medical reason to take pain medicine. Your doctor will tell you how much medicine to take. If your doctor wants you to stop the medicine, the dose will be slowly lowered over time to avoid any side effects. You may get drowsy or dizzy when you first start taking the medicine or change doses. Do not drive, use machinery, or do anything that may be dangerous until you know how the medicine affects you. Stand or sit up slowly. There are different types of narcotic medicines (opiates) for pain. If you take more than one type at the same time, you may have more side effects. Give your health care provider a list of all medicines you use. Your doctor will tell you how much medicine to take. Do not take more  medicine than directed. Call emergency for help if you have problems breathing. The medicine will cause constipation. Try to have a bowel movement at least every 2 to 3 days. If you do not have a bowel movement for 3 days, call your doctor or health care professional. Too much acetaminophen can be very dangerous. Do not take Tylenol (acetaminophen) or medicines that contain acetaminophen with this medicine. Many non-prescription medicines contain acetaminophen. Always read the labels carefully. What side effects may I notice from receiving this medicine? Side effects that you should report to your doctor or health care professional as soon as possible: -allergic reactions like skin rash, itching or hives, swelling of the face, lips, or tongue -breathing problems -confusion -feeling faint or lightheaded, falls -stomach pain -yellowing of the eyes or skin Side effects that usually do not require medical attention (report to your doctor or health care professional if they continue or are bothersome): -nausea, vomiting -stomach upset This list may not describe all possible side effects. Call your doctor for medical advice about side effects. You may report side effects to FDA at 1-800-FDA-1088. Where should I keep my medicine? Keep out of the reach of children. This medicine can be abused. Keep your medicine in a safe place to protect it from theft. Do not share this medicine with anyone. Selling or giving away this medicine is dangerous and against the law. This medicine may cause accidental overdose and death if it taken by other adults, children, or pets. Mix any unused medicine with a substance like cat litter or coffee grounds. Then throw  the medicine away in a sealed container like a sealed bag or a coffee can with a lid. Do not use the medicine after the expiration date. Store at room temperature between 15 and 30 degrees C (59 and 86 degrees F). NOTE: This sheet is a summary. It may not cover  all possible information. If you have questions about this medicine, talk to your doctor, pharmacist, or health care provider.    2016, Elsevier/Gold Standard. (2014-10-25 15:29:20)

## 2016-01-15 LAB — VITAMIN D 25 HYDROXY (VIT D DEFICIENCY, FRACTURES): Vit D, 25-Hydroxy: 19 ng/mL — ABNORMAL LOW (ref 30–100)

## 2016-01-18 DIAGNOSIS — H5213 Myopia, bilateral: Secondary | ICD-10-CM | POA: Diagnosis not present

## 2016-01-24 ENCOUNTER — Ambulatory Visit (INDEPENDENT_AMBULATORY_CARE_PROVIDER_SITE_OTHER): Payer: 59 | Admitting: Family Medicine

## 2016-01-24 ENCOUNTER — Encounter: Payer: Self-pay | Admitting: Family Medicine

## 2016-01-24 VITALS — BP 124/80 | HR 78 | Temp 98.4°F | Resp 16 | Ht 66.0 in | Wt 255.8 lb

## 2016-01-24 DIAGNOSIS — N938 Other specified abnormal uterine and vaginal bleeding: Secondary | ICD-10-CM | POA: Diagnosis not present

## 2016-01-24 DIAGNOSIS — M5442 Lumbago with sciatica, left side: Secondary | ICD-10-CM | POA: Diagnosis not present

## 2016-01-24 DIAGNOSIS — D509 Iron deficiency anemia, unspecified: Secondary | ICD-10-CM

## 2016-01-24 DIAGNOSIS — I10 Essential (primary) hypertension: Secondary | ICD-10-CM | POA: Diagnosis not present

## 2016-01-24 DIAGNOSIS — Z3009 Encounter for other general counseling and advice on contraception: Secondary | ICD-10-CM | POA: Diagnosis not present

## 2016-01-24 DIAGNOSIS — Z5181 Encounter for therapeutic drug level monitoring: Secondary | ICD-10-CM

## 2016-01-24 LAB — POCT URINE PREGNANCY: Preg Test, Ur: NEGATIVE

## 2016-01-24 MED ORDER — HYDROCODONE-ACETAMINOPHEN 5-325 MG PO TABS: 1.0000 | ORAL_TABLET | Freq: Three times a day (TID) | ORAL | Status: DC | PRN

## 2016-01-24 MED ORDER — HYDROCHLOROTHIAZIDE 25 MG PO TABS: 25.0000 mg | ORAL_TABLET | Freq: Every day | ORAL | Status: DC

## 2016-01-24 MED ORDER — MEDROXYPROGESTERONE ACETATE 150 MG/ML IM SUSP
150.0000 mg | Freq: Once | INTRAMUSCULAR | Status: AC
  Administered 2016-01-24: 150 mg via INTRAMUSCULAR

## 2016-01-24 MED ORDER — LOSARTAN POTASSIUM 100 MG PO TABS: 100.0000 mg | ORAL_TABLET | Freq: Every day | ORAL | Status: DC

## 2016-01-24 NOTE — Patient Instructions (Addendum)
[phone] is my cell - trying to be better about MyChart emails but if you are still bleeding and need a gyn referral try MyChart again and if you don't hear from me in a couple of days you are welcome to text me!  Iron-Rich Diet Iron is a mineral that helps your body to produce hemoglobin. Hemoglobin is a protein in your red blood cells that carries oxygen to your body's tissues. Eating too little iron may cause you to feel weak and tired, and it can increase your risk for infection. Eating enough iron is necessary for your body's metabolism, muscle function, and nervous system. Iron is naturally found in many foods. It can also be added to foods or fortified in foods. There are two types of dietary iron:  Heme iron. Heme iron is absorbed by the body more easily than nonheme iron. Heme iron is found in meat, poultry, and fish.  Nonheme iron. Nonheme iron is found in dietary supplements, iron-fortified grains, beans, and vegetables. You may need to follow an iron-rich diet if:  You have been diagnosed with iron deficiency or iron-deficiency anemia.  You have a condition that prevents you from absorbing dietary iron, such as:  Infection in your intestines.  Celiac disease. This involves long-lasting (chronic) inflammation of your intestines.  You do not eat enough iron.  You eat a diet that is high in foods that impair iron absorption.  You have lost a lot of blood.  You have heavy bleeding during your menstrual cycle.  You are pregnant. WHAT IS MY PLAN? Your health care provider may help you to determine how much iron you need per day based on your condition. Generally, when a person consumes sufficient amounts of iron in the diet, the following iron needs are met:  Men.  40-37 years old: 11 mg per day.  40-32 years old: 8 mg per day.  Women.   46-34 years old: 15 mg per day.  63-47 years old: 18 mg per day.  Over 47 years old: 8 mg per day.  Pregnant women: 27 mg per  day.  Breastfeeding women: 9 mg per day. WHAT DO I NEED TO KNOW ABOUT AN IRON-RICH DIET?  Eat fresh fruits and vegetables that are high in vitamin C along with foods that are high in iron. This will help increase the amount of iron that your body absorbs from food, especially with foods containing nonheme iron. Foods that are high in vitamin C include oranges, peppers, tomatoes, and mango.  Take iron supplements only as directed by your health care provider. Overdose of iron can be life-threatening. If you were prescribed iron supplements, take them with orange juice or a vitamin C supplement.  Cook foods in pots and pans that are made from iron.   Eat nonheme iron-containing foods alongside foods that are high in heme iron. This helps to improve your iron absorption.   Certain foods and drinks contain compounds that impair iron absorption. Avoid eating these foods in the same meal as iron-rich foods or with iron supplements. These include:  Coffee, black tea, and red wine.  Milk, dairy products, and foods that are high in calcium.  Beans, soybeans, and peas.  Whole grains.  When eating foods that contain both nonheme iron and compounds that impair iron absorption, follow these tips to absorb iron better.   Soak beans overnight before cooking.  Soak whole grains overnight and drain them before using.  Ferment flours before baking, such as using yeast  in bread dough. WHAT FOODS CAN I EAT? Grains Iron-fortified breakfast cereal. Iron-fortified whole-wheat bread. Enriched rice. Sprouted grains. Vegetables Spinach. Potatoes with skin. Green peas. Broccoli. Red and green bell peppers. Fermented vegetables. Fruits Prunes. Raisins. Oranges. Strawberries. Mango. Grapefruit. Meats and Other Protein Sources Beef liver. Oysters. Beef. Shrimp. [place]. Chicken. [place]. Sardines. Chickpeas. Nuts. Tofu. Beverages Tomato juice. Fresh orange juice. Prune juice. Hibiscus tea. Fortified  instant breakfast shakes. Condiments Tahini. Fermented soy sauce. Sweets and Desserts Black-strap molasses.  Other Wheat germ. The items listed above may not be a complete list of recommended foods or beverages. Contact your dietitian for more options. WHAT FOODS ARE NOT RECOMMENDED? Grains Whole grains. Bran cereal. Bran flour. Oats. Vegetables Artichokes. Brussels sprouts. Kale. Fruits Blueberries. Raspberries. Strawberries. Figs. Meats and Other Protein Sources Soybeans. Products made from soy protein. Dairy Milk. Cream. Cheese. Yogurt. Cottage cheese. Beverages Coffee. Black tea. Red wine. Sweets and Desserts Cocoa. Chocolate. Ice cream. Other Basil. Oregano. Parsley. The items listed above may not be a complete list of foods and beverages to avoid. Contact your dietitian for more information.   This information is not intended to replace advice given to you by your health care provider. Make sure you discuss any questions you have with your health care provider.   Document Released: 07/08/2005 Document Revised: 12/15/2014 Document Reviewed: 06/21/2014 Elsevier Interactive Patient Education 2016 [place] Metrorrhagia is bleeding from the uterus that happens irregularly but often. The bleeding generally happens between menstrual periods. HOME CARE INSTRUCTIONS Pay attention to any changes in your symptoms. Follow these instructions to help with your condition: Eating  Eat well-balanced meals. Include foods that are high in iron, such as liver, meat, shellfish, green leafy vegetables, and eggs.  If you become constipated:  Drink plenty of water.  Eat fruits and vegetables that are high in water and fiber, such as spinach, carrots, raspberries, apples, and mango. Medicines  Take over-the-counter and prescription medicines only as told by your health care provider.  Do not change medicines without talking with your health care  provider.  Aspirin or medicines that contain aspirin may make the bleeding worse. Do not take those medicines:  During the week before your period.  During your period.  If you were prescribed iron pills, take them as told by your health care provider. Iron pills help to replace iron that your body loses because of this condition. Activity  If you need to change your sanitary pad or tampon more than one time every 2 hours:  Lie in bed with your feet raised (elevated).  Place a cold pack on your lower abdomen.  Rest as much as possible until the bleeding stops or slows down.  Do not try to lose weight until the bleeding has stopped and your blood iron level is back to normal. Other Instructions  For two months, write down:  When your period starts.  When your period ends.  When any abnormal bleeding occurs.  What problems you notice.  Keep all follow-up visits as told by your health care provider. This is important. SEEK MEDICAL CARE IF:  You get light-headed or weak.  You have nausea and vomiting.  You cannot eat or drink without vomiting.  You feel dizzy or have diarrhea while you are taking medicine.  You are taking birth control pills or hormones, and you want to change them or stop taking them. SEEK IMMEDIATE MEDICAL CARE IF:  You develop a fever or chills.  You need to change  your sanitary pad or tampon more than one time per hour.  Your bleeding becomesheavy.  Your flow contains clots.  You develop pain in your abdomen.  You lose consciousness.  You develop a rash.   This information is not intended to replace advice given to you by your health care provider. Make sure you discuss any questions you have with your health care provider.   Document Released: 11/24/2005 Document Revised: 08/15/2015 Document Reviewed: 02/19/2015 Elsevier Interactive Patient Education [employer].

## 2016-01-24 NOTE — Progress Notes (Signed)
Subjective:    Patient ID: Pam Mckee, female    DOB: [birth year], 45 y.o.   MRN: [id number] Chief Complaint  Patient presents with  . Follow-up  . pt will have MRI tomorrow on legs for numbnesss and burning  . Depo Provera injection    HPI She has a lumbar MRI scheduled tomorrow for 4 pm Burning sensation in both of her thighs The 6 day prednisone course helped and pain medicines helped.  Flexeril just made her sleepy but did not help sxs  Left lower ext radiculopathy > right and left was numb.  Had to take a dulcolax from constipation from pain meds.   She is starting with her sleep apnea machine x 2 mos - is at a 8 mmHg as it is giving her HAs in the frontal and occipital   She is doing the Live Well challenge with her job - taking lot of stairs at work for exercise.  Past Medical History  Diagnosis Date  . Seizures ([place])   . Thyroid disease     hypo  . Hypertension   . OSA on CPAP    No past surgical history on file. Current Outpatient Prescriptions on File Prior to Visit  Medication Sig Dispense Refill  . ergocalciferol (VITAMIN D2) 50000 UNITS capsule Take 1 capsule (50,000 Units total) by mouth once a week. 12 capsule 1  . iron polysaccharides (NIFEREX) 150 MG capsule Take 1 capsule (150 mg total) by mouth daily. 90 capsule 1  . levothyroxine (SYNTHROID, LEVOTHROID) 88 MCG tablet Take 88 mcg by mouth daily before breakfast.     No current facility-administered medications on file prior to visit.   No Known Allergies Family History  Problem Relation Age of Onset  . Hypertension Mother   . Hypertension Brother   . Diabetes Paternal Grandfather   . Renal Disease Father    Social History   Social History  . Marital Status: Single    Spouse Name: N/A  . Number of Children: 1  . 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 Smoking status: Never Smoker   . Smokeless tobacco: None  .  Alcohol Use: No  . Drug Use: No  . Sexual Activity: Yes    Birth Control/ Protection: Condom   Other Topics Concern  . None   Social History Narrative   Single.Exercise 3 times/week Zumba for 1 hour   Drinks about 2-3 cups of coffee/tea a day. Education: [employer].    Review of Systems  Constitutional: Positive for fatigue. Negative for fever, chills, diaphoresis and appetite change.  Eyes: Negative for visual disturbance.  Respiratory: Negative for cough and shortness of breath.   Cardiovascular: Negative for chest pain, palpitations and leg swelling.  Gastrointestinal: Positive for constipation. Negative for vomiting and diarrhea.  Genitourinary: Positive for vaginal bleeding and menstrual problem. Negative for decreased urine volume.  Musculoskeletal: Positive for myalgias, back pain and arthralgias. Negative for joint swelling and gait problem.  Skin: Negative for rash.  Neurological: Positive for weakness, numbness and headaches. Negative for syncope.  Hematological: Does not bruise/bleed easily.       Objective:  BP 124/80 mmHg  Pulse 78  Temp(Src) 98.4 F (36.9 C) (Oral)  Resp 16  Ht 5\' 6"  (1.676 m)  Wt 255 lb 12.8 oz (116.03 kg)  BMI 41.31 kg/m2  SpO2 97%  Physical Exam  Constitutional: She is oriented to  person, place, and time. She appears well-developed and well-nourished. No distress.  HENT:  Head: Normocephalic and atraumatic.  Right Ear: External ear normal.  Left Ear: External ear normal.  Eyes: Conjunctivae are normal. No scleral icterus.  Neck: Normal range of motion. Neck supple. No thyromegaly present.  Cardiovascular: Normal rate, regular rhythm, normal heart sounds and intact distal pulses.   Pulmonary/Chest: Effort normal and breath sounds normal. No respiratory distress.  Musculoskeletal: She exhibits no edema.  Lymphadenopathy:    She has no cervical adenopathy.  Neurological: She is alert and oriented to person, place, and time.  Reflex  Scores:      Patellar reflexes are 1+ on the right side and 1+ on the left side.      Achilles reflexes are 1+ on the right side and 1+ on the left side. Skin: Skin is warm and dry. She is not diaphoretic. No erythema.  Psychiatric: She has a normal mood and affect. Her behavior is normal.      Assessment & Plan:   1. Essential hypertension   2. Midline low back pain with left-sided sciatica - has lumbar MRI scheduled for tomorrow, refilled prn norco until can get more definitive treatment  3. Anemia, iron deficiency - stable, continuing on oral iron supp  4. Medication monitoring encounter   5. Family planning   6. Dysfunctional uterine bleeding - Depo-Provera did seem to be helping some with menorrhagia until the pat 3 mos during which she has had metromenorrhagia return - will try depo-provera shot again today but if sxs cont, will refer pt to gyn to consider IUD to treat sxs  7.    OSA - started cpap over past few months but is causing morning HA - possible the setting or mask positioning isn't right for her - pt will contact [facility] about this.  Orders Placed This Encounter  Procedures  . CBC  . Ferritin  . POCT urine pregnancy    Meds ordered this encounter  Medications  . medroxyPROGESTERone (DEPO-PROVERA) injection 150 mg    Sig:   . HYDROcodone-acetaminophen (NORCO) 5-325 MG tablet    Sig: Take 1 tablet by mouth every 8 (eight) hours as needed for moderate pain or severe pain. Take with stool softener    Dispense:  30 tablet    Refill:  0  . hydrochlorothiazide (HYDRODIURIL) 25 MG tablet    Sig: Take 1 tablet (25 mg total) by mouth daily.    Dispense:  90 tablet    Refill:  3  . losartan (COZAAR) 100 MG tablet    Sig: Take 1 tablet (100 mg total) by mouth daily.    Dispense:  90 tablet    Refill:  3    [name], MD MPH  Results for orders placed or performed in visit on 01/24/16  CBC  Result Value Ref Range   WBC 10.8 (H) 4.0 - 10.5 K/uL   RBC 4.71 3.87 - 5.11  MIL/uL   Hemoglobin 11.0 (L) 12.0 - 15.0 g/dL   HCT 34.8 (L) 36.0 - 46.0 %   MCV 73.9 (L) 78.0 - 100.0 fL   MCH 23.4 (L) 26.0 - 34.0 pg   MCHC 31.6 30.0 - 36.0 g/dL   RDW 15.3 11.5 - 15.5 %   Platelets 318 150 - 400 K/uL   MPV 10.9 8.6 - 12.4 fL  Ferritin  Result Value Ref Range   Ferritin 58 10 - 232 ng/mL  POCT urine pregnancy  Result Value Ref  Range   Preg Test, Ur Negative Negative

## 2016-01-25 ENCOUNTER — Ambulatory Visit (HOSPITAL_COMMUNITY)
Admission: RE | Admit: 2016-01-25 | Discharge: 2016-01-25 | Disposition: A | Discharge status: Home / Self Care | Payer: 59 | Source: Ambulatory Visit | Attending: Family Medicine | Admitting: Family Medicine

## 2016-01-25 DIAGNOSIS — R202 Paresthesia of skin: Secondary | ICD-10-CM | POA: Diagnosis not present

## 2016-01-25 DIAGNOSIS — M5442 Lumbago with sciatica, left side: Secondary | ICD-10-CM | POA: Diagnosis present

## 2016-01-25 DIAGNOSIS — M5114 Intervertebral disc disorders with radiculopathy, thoracic region: Secondary | ICD-10-CM | POA: Diagnosis not present

## 2016-01-25 DIAGNOSIS — M4690 Unspecified inflammatory spondylopathy, site unspecified: Secondary | ICD-10-CM | POA: Diagnosis not present

## 2016-01-25 DIAGNOSIS — M5116 Intervertebral disc disorders with radiculopathy, lumbar region: Secondary | ICD-10-CM | POA: Insufficient documentation

## 2016-01-25 DIAGNOSIS — M5126 Other intervertebral disc displacement, lumbar region: Secondary | ICD-10-CM | POA: Diagnosis not present

## 2016-01-25 LAB — CBC
HCT: 34.8 % — ABNORMAL LOW (ref 36.0–46.0)
Hemoglobin: 11 g/dL — ABNORMAL LOW (ref 12.0–15.0)
MCH: 23.4 pg — ABNORMAL LOW (ref 26.0–34.0)
MCHC: 31.6 g/dL (ref 30.0–36.0)
MCV: 73.9 fL — ABNORMAL LOW (ref 78.0–100.0)
MPV: 10.9 fL (ref 8.6–12.4)
Platelets: 318 10*3/uL (ref 150–400)
RBC: 4.71 MIL/uL (ref 3.87–5.11)
RDW: 15.3 % (ref 11.5–15.5)
WBC: 10.8 10*3/uL — ABNORMAL HIGH (ref 4.0–10.5)

## 2016-01-25 LAB — FERRITIN: Ferritin: 58 ng/mL (ref 10–232)

## 2016-01-25 MED FILL — HYDROCODON-APAP 5-325: 5-325 | 10 days supply | Qty: 30 | Fill #0

## 2016-01-28 ENCOUNTER — Encounter: Payer: Self-pay | Admitting: Family Medicine

## 2016-01-30 ENCOUNTER — Encounter: Payer: Self-pay | Admitting: Family Medicine

## 2016-01-30 DIAGNOSIS — G4733 Obstructive sleep apnea (adult) (pediatric): Secondary | ICD-10-CM | POA: Diagnosis not present

## 2016-01-31 ENCOUNTER — Other Ambulatory Visit: Payer: Self-pay | Admitting: Family Medicine

## 2016-01-31 DIAGNOSIS — M5410 Radiculopathy, site unspecified: Secondary | ICD-10-CM

## 2016-01-31 DIAGNOSIS — M5136 Other intervertebral disc degeneration, lumbar region: Secondary | ICD-10-CM

## 2016-01-31 DIAGNOSIS — M51369 Other intervertebral disc degeneration, lumbar region without mention of lumbar back pain or lower extremity pain: Secondary | ICD-10-CM

## 2016-01-31 DIAGNOSIS — M5416 Radiculopathy, lumbar region: Secondary | ICD-10-CM

## 2016-01-31 NOTE — Progress Notes (Signed)
Pt with [place] so please refer her to whoever is in [facility] for the in-network copays. If no neurosurgeons are then please send her to an orthopedic spinal surgeon. Pt still with bilateral lower extremity radicular nerve pain, numbness, weakness persistent after oral steroid therapy.  01/25/16 MRI shows:  1. At L2-3 there is a mild broad-based disc bulge with a right extra foraminal disc protrusion abutting the right extra foraminal L2 nerve root. 2. At L4-5 there is a right extra foraminal disc protrusion abutting the left L4 nerve root. Mild bilateral facet arthropathy.

## 2016-02-05 DIAGNOSIS — E039 Hypothyroidism, unspecified: Secondary | ICD-10-CM | POA: Diagnosis not present

## 2016-02-08 ENCOUNTER — Ambulatory Visit (INDEPENDENT_AMBULATORY_CARE_PROVIDER_SITE_OTHER): Payer: 59 | Admitting: Emergency Medicine

## 2016-02-08 ENCOUNTER — Ambulatory Visit (INDEPENDENT_AMBULATORY_CARE_PROVIDER_SITE_OTHER): Payer: 59

## 2016-02-08 VITALS — BP 126/84 | HR 70 | Temp 97.9°F | Resp 16 | Ht 66.0 in | Wt 256.0 lb

## 2016-02-08 DIAGNOSIS — M25562 Pain in left knee: Secondary | ICD-10-CM | POA: Diagnosis not present

## 2016-02-08 NOTE — Patient Instructions (Signed)
Because you received an x-ray today, you will receive an invoice from [facility]. Please contact [facility] at [phone] with questions or concerns regarding your invoice. Our billing staff will not be able to assist you with those questions. °

## 2016-02-08 NOTE — Progress Notes (Signed)
By signing my name below, I, Pam Mckee, attest that this documentation has been prepared under the direction and in the presence of Pam Queen, MD.  Electronically Signed: Thea Mckee, ED Scribe. 02/08/2016. 8:19 AM.  Chief Complaint:  Chief Complaint  Patient presents with  . Knee Pain    pt states her knee popped/ x monday/ left    HPI: Pam Mckee is a 45 y.o. female who reports to [facility] today complaining of left knee pain. Pt states while lying in bed 5 days ago she heard a "pop/crack" in left knee when she straightened her leg. Since then, she's had worsening left knee pain with certain movements. She reports tightness to the top of her knee and has been doing exercises while at work to loose tightness. She has been by Dr. Micheline Chapman in Sports Medicine. She has an appointment with  Neurosurgeon in 3 days for a bulging disc with radiating burning pain or numbness to bilateral thighs. She denies chance of pregnancy and had depo in February for [facility]. Pt works as a Occupational psychologist.   Past Medical History  Diagnosis Date  . Seizures ([place])   . Thyroid disease     hypo  . Hypertension   . OSA on CPAP    History reviewed. No pertinent past surgical history. Social History   Social History  . Marital Status: Single    Spouse Name: N/A  . Number of Children: 1  . Years of Education: HS   Occupational History  . 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 Smoking status: Never Smoker   . Smokeless tobacco: None  . Alcohol Use: No  . Drug Use: No  . Sexual Activity: Yes    Birth Control/ Protection: Condom   Other Topics Concern  . None   Social History Narrative   Single.Exercise 3 times/week Zumba for 1 hour   Drinks about 2-3 cups of coffee/tea a day. Education: [employer].   Family History  Problem Relation Age of Onset  . Hypertension Mother   . Hypertension Brother   . Diabetes Paternal Grandfather   . Renal Disease Father    No Known  Allergies Prior to Admission medications   Medication Sig Start Date End Date Taking? Authorizing Provider  ergocalciferol (VITAMIN D2) 50000 UNITS capsule Take 1 capsule (50,000 Units total) by mouth once a week. 10/28/15  Yes Shawnee Knapp, MD  hydrochlorothiazide (HYDRODIURIL) 25 MG tablet Take 1 tablet (25 mg total) by mouth daily. 01/24/16  Yes Shawnee Knapp, MD  iron polysaccharides (NIFEREX) 150 MG capsule Take 1 capsule (150 mg total) by mouth daily. 10/28/15  Yes Shawnee Knapp, MD  levothyroxine (SYNTHROID, LEVOTHROID) 88 MCG tablet Take 88 mcg by mouth daily before breakfast.   Yes Historical Provider, MD  losartan (COZAAR) 100 MG tablet Take 1 tablet (100 mg total) by mouth daily. 01/24/16  Yes Shawnee Knapp, MD  HYDROcodone-acetaminophen (NORCO) 5-325 MG tablet Take 1 tablet by mouth every 8 (eight) hours as needed for moderate pain or severe pain. Take with stool softener Patient not taking: Reported on 02/08/2016 01/24/16   Shawnee Knapp, MD     ROS: The patient denies fevers, chills, night sweats, unintentional weight loss, chest pain, palpitations, wheezing, dyspnea on exertion, nausea, vomiting, abdominal pain, dysuria, hematuria, melena, weakness, or tingling.   All other systems have been reviewed and were otherwise negative with the exception of those mentioned in the  HPI and as above.    PHYSICAL EXAM: Filed Vitals:   02/08/16 0812  BP: 126/84  Pulse: 70  Temp: 97.9 F (36.6 C)  Resp: 16   Body mass index is 41.34 kg/(m^2).   General: Alert, no acute distress HEENT:  Normocephalic, atraumatic, oropharynx patent. Eye: Juliette Mangle [facility] Cardiovascular:  Regular rate and rhythm, no rubs murmurs or gallops.  No Carotid bruits, radial pulse intact. No pedal edema.  Respiratory: Clear to auscultation bilaterally.  No wheezes, rales, or rhonchi.  No cyanosis, no use of accessory musculature Abdominal: No organomegaly, abdomen is soft and non-tender, positive bowel sounds.  No  masses. Musculoskeletal: Gait intact. No edema. Tender over medial portion of left knee with pain on flexion and extension. No instability.  Skin: No rashes. Neurologic: Facial musculature symmetric. Psychiatric: Patient acts appropriately throughout our interaction. Lymphatic: No cervical or submandibular lymphadenopathy   EKG/XRAY:   Primary read interpreted by Dr. Everlene Farrier at [facility]. Dg Knee Complete 4 Views Left  02/08/2016  CLINICAL DATA:  Left knee pain for 1 week.  No reported injury. EXAM: LEFT KNEE - COMPLETE 4+ VIEW COMPARISON:  04/30/2015 left knee radiographs. FINDINGS: No fracture, dislocation or joint effusion. There is subtle periosteal reaction at the lateral aspect of the distal left femoral metaphysis on the AP internal rotation view. No appreciable arthropathy. IMPRESSION: Subtle periosteal reaction at the lateral aspect of the distal left femoral metaphysis, a nonspecific finding. Further evaluation with MRI of the distal left femur without and with intravenous contrast is recommended to exclude an underlying bone lesion. Electronically Signed   By: Ilona Sorrel M.D.   On: 02/08/2016 08:47    ASSESSMENT/PLAN: Patient placed in a knee sleeve. We'll schedule MRI with contrast further evaluate the abnormal periosteal area seen by x-ray.I personally performed the services described in this documentation, which was scribed in my presence. The recorded information has been reviewed and is accurate.   Gross sideeffects, risk and benefits, and alternatives of medications d/w patient. Patient is aware that all medications have potential sideeffects and we are unable to predict every sideeffect or drug-drug interaction that may occur.  Pam Queen MD 02/08/2016 8:15 AM

## 2016-02-11 DIAGNOSIS — M5136 Other intervertebral disc degeneration, lumbar region: Secondary | ICD-10-CM | POA: Diagnosis not present

## 2016-02-11 DIAGNOSIS — Z6841 Body Mass Index (BMI) 40.0 and over, adult: Secondary | ICD-10-CM | POA: Diagnosis not present

## 2016-02-14 MED FILL — LEVOTHYROXINE 112 MCG TAB: 112 | 30 days supply | Qty: 30 | Fill #3

## 2016-02-19 ENCOUNTER — Ambulatory Visit (HOSPITAL_COMMUNITY)
Admission: RE | Admit: 2016-02-19 | Discharge: 2016-02-19 | Disposition: A | Discharge status: Home / Self Care | Payer: 59 | Source: Ambulatory Visit | Attending: Emergency Medicine | Admitting: Emergency Medicine

## 2016-02-19 ENCOUNTER — Other Ambulatory Visit: Payer: Self-pay | Admitting: Emergency Medicine

## 2016-02-19 DIAGNOSIS — M25562 Pain in left knee: Secondary | ICD-10-CM | POA: Diagnosis not present

## 2016-02-19 DIAGNOSIS — X58XXXA Exposure to other specified factors, initial encounter: Secondary | ICD-10-CM | POA: Diagnosis not present

## 2016-02-19 DIAGNOSIS — S83232A Complex tear of medial meniscus, current injury, left knee, initial encounter: Secondary | ICD-10-CM | POA: Diagnosis not present

## 2016-02-19 DIAGNOSIS — D1622 Benign neoplasm of long bones of left lower limb: Secondary | ICD-10-CM | POA: Diagnosis not present

## 2016-02-19 DIAGNOSIS — S83207A Unspecified tear of unspecified meniscus, current injury, left knee, initial encounter: Secondary | ICD-10-CM

## 2016-02-19 DIAGNOSIS — S83242A Other tear of medial meniscus, current injury, left knee, initial encounter: Secondary | ICD-10-CM | POA: Diagnosis not present

## 2016-02-19 DIAGNOSIS — M7122 Synovial cyst of popliteal space [Baker], left knee: Secondary | ICD-10-CM | POA: Diagnosis not present

## 2016-02-19 MED ORDER — GADOBENATE DIMEGLUMINE 529 MG/ML IV SOLN
20.0000 mL | Freq: Once | INTRAVENOUS | Status: AC | PRN
  Administered 2016-02-19: 20 mL via INTRAVENOUS

## 2016-02-20 ENCOUNTER — Encounter: Payer: Self-pay | Admitting: Emergency Medicine

## 2016-02-22 ENCOUNTER — Telehealth: Payer: Self-pay

## 2016-02-22 ENCOUNTER — Encounter: Payer: Self-pay | Admitting: Emergency Medicine

## 2016-02-22 NOTE — Telephone Encounter (Signed)
Pt called back to xray department about ct scan results  Best number [phone]

## 2016-02-24 NOTE — Telephone Encounter (Signed)
Unable to reach patient no message.

## 2016-02-26 MED FILL — LOSARTAN POTASSIUM 100 MG T: 100 | 90 days supply | Qty: 90 | Fill #0

## 2016-02-26 MED FILL — HYDROCHLOROTHIAZIDE 25 MG T: 25 | 90 days supply | Qty: 90 | Fill #3

## 2016-02-27 ENCOUNTER — Telehealth: Payer: Self-pay

## 2016-02-27 DIAGNOSIS — G4733 Obstructive sleep apnea (adult) (pediatric): Secondary | ICD-10-CM | POA: Diagnosis not present

## 2016-02-27 NOTE — Telephone Encounter (Signed)
Pt states the knee brace she was given isn't staying on, it keeps slipping and was told by the Sophronia Simas it may have a defect in it, would like to know if she can get the sleeve kind instead or Do we need to send the other one back to the manufacturer. Please call [phone] or her cell at [phone]

## 2016-02-27 NOTE — Telephone Encounter (Signed)
Please advise 

## 2016-02-29 NOTE — Telephone Encounter (Signed)
Left message for pt to call back  °

## 2016-02-29 NOTE — Telephone Encounter (Signed)
She should have been given a piece of paper when she got the knee brace and it should have the [employer] number on it - she needs to cal them if it is defective.  If she is seeing ortho I would have them fit her with a brace that they think is the best kind.

## 2016-02-29 NOTE — Telephone Encounter (Signed)
Advised message to pt. Pt understood.

## 2016-03-04 MED FILL — ALL DAY ALLERGY 10 MG TAB: 10 | 100 days supply | Qty: 100 | Fill #1

## 2016-03-11 MED FILL — LEVOTHYROXINE 112 MCG TAB: 112 | 30 days supply | Qty: 30 | Fill #4

## 2016-03-11 MED FILL — VIT D2 1.25 MG (50,000 UNIT: 1.25 MG | 84 days supply | Qty: 12 | Fill #1

## 2016-03-13 DIAGNOSIS — S83242A Other tear of medial meniscus, current injury, left knee, initial encounter: Secondary | ICD-10-CM | POA: Diagnosis not present

## 2016-03-13 DIAGNOSIS — M25562 Pain in left knee: Secondary | ICD-10-CM | POA: Diagnosis not present

## 2016-03-13 DIAGNOSIS — M1712 Unilateral primary osteoarthritis, left knee: Secondary | ICD-10-CM | POA: Diagnosis not present

## 2016-03-26 ENCOUNTER — Encounter: Payer: Self-pay | Admitting: Family Medicine

## 2016-03-29 DIAGNOSIS — G4733 Obstructive sleep apnea (adult) (pediatric): Secondary | ICD-10-CM | POA: Diagnosis not present

## 2016-04-21 ENCOUNTER — Encounter: Payer: Self-pay | Admitting: *Deleted

## 2016-04-22 MED FILL — LEVOTHYROXINE 112 MCG TAB: 112 | 30 days supply | Qty: 30 | Fill #5

## 2016-04-24 ENCOUNTER — Ambulatory Visit (INDEPENDENT_AMBULATORY_CARE_PROVIDER_SITE_OTHER): Payer: 59 | Admitting: Family Medicine

## 2016-04-24 ENCOUNTER — Encounter: Payer: Self-pay | Admitting: Family Medicine

## 2016-04-24 VITALS — BP 124/82 | HR 80 | Temp 98.5°F | Resp 16 | Ht 66.0 in | Wt 243.0 lb

## 2016-04-24 DIAGNOSIS — I10 Essential (primary) hypertension: Secondary | ICD-10-CM | POA: Diagnosis not present

## 2016-04-24 DIAGNOSIS — E559 Vitamin D deficiency, unspecified: Secondary | ICD-10-CM | POA: Diagnosis not present

## 2016-04-24 DIAGNOSIS — Z8249 Family history of ischemic heart disease and other diseases of the circulatory system: Secondary | ICD-10-CM

## 2016-04-24 DIAGNOSIS — N920 Excessive and frequent menstruation with regular cycle: Secondary | ICD-10-CM

## 2016-04-24 DIAGNOSIS — Z8489 Family history of other specified conditions: Secondary | ICD-10-CM

## 2016-04-24 DIAGNOSIS — E039 Hypothyroidism, unspecified: Secondary | ICD-10-CM

## 2016-04-24 DIAGNOSIS — D509 Iron deficiency anemia, unspecified: Secondary | ICD-10-CM | POA: Diagnosis not present

## 2016-04-24 LAB — POCT CBC
Granulocyte percent: 57.5 %G (ref 37–80)
HCT, POC: 30.7 % — AB (ref 37.7–47.9)
Hemoglobin: 10.4 g/dL — AB (ref 12.2–16.2)
Lymph, poc: 3.4 (ref 0.6–3.4)
MCH, POC: 24.5 pg — AB (ref 27–31.2)
MCHC: 33.8 g/dL (ref 31.8–35.4)
MCV: 72.4 fL — AB (ref 80–97)
MID (cbc): 0.2 (ref 0–0.9)
MPV: 8.1 fL (ref 0–99.8)
POC Granulocyte: 4.9 (ref 2–6.9)
POC LYMPH PERCENT: 39.7 %L (ref 10–50)
POC MID %: 2.8 %M (ref 0–12)
Platelet Count, POC: 269 10*3/uL (ref 142–424)
RBC: 4.24 M/uL (ref 4.04–5.48)
RDW, POC: 15.6 %
WBC: 8.6 10*3/uL (ref 4.6–10.2)

## 2016-04-24 LAB — BASIC METABOLIC PANEL
BUN: 25 mg/dL (ref 7–25)
CO2: 24 mmol/L (ref 20–31)
Calcium: 8.7 mg/dL (ref 8.6–10.2)
Chloride: 101 mmol/L (ref 98–110)
Creat: 0.76 mg/dL (ref 0.50–1.10)
Glucose, Bld: 84 mg/dL (ref 65–99)
Potassium: 3.9 mmol/L (ref 3.5–5.3)
Sodium: 136 mmol/L (ref 135–146)

## 2016-04-24 LAB — FERRITIN: Ferritin: 36 ng/mL (ref 10–232)

## 2016-04-24 MED ORDER — NORGESTIMATE-ETH ESTRADIOL 0.25-35 MG-MCG PO TABS: ORAL_TABLET | ORAL | Status: DC

## 2016-04-24 MED FILL — MONO-LINYAH 28 TABLET: 0.25-35 | 84 days supply | Qty: 112 | Fill #0

## 2016-04-24 NOTE — Patient Instructions (Signed)
We recommend that you schedule a mammogram for breast cancer screening. Typically, you do not need a referral to do this. Please contact a local imaging center to schedule your mammogram.  [facility] - [phone]  *ask for the Radiology Department [facility] ([place]) - [phone] or [phone]  [facility] - [phone] [place] [phone] [facility] - [phone]  *ask for the [facility] - [phone]  *ask for the Radiology Department [facility] - [phone]  *ask for the [facility] - [phone]      IF you received an x-ray today, you will receive an invoice from [facility]. Please contact [facility] at [phone] with questions or concerns regarding your invoice.   IF you received labwork today, you will receive an invoice from [employer]. Please contact [facility] at [phone] with questions or concerns regarding your invoice.   Our billing staff will not be able to assist you with questions regarding bills from these companies.  You will be contacted with the lab results as soon as they are available. The fastest way to get your results is to activate your My Chart account. Instructions are located on the last page of this paperwork. If you have not heard from [place] regarding the results in 2 weeks, please contact this office.

## 2016-04-24 NOTE — Progress Notes (Signed)
Subjective:    Patient ID: Pam Mckee, female    DOB: [birth year], 45 y.o.   MRN: [id number] Chief Complaint  Patient presents with  . depo provera inj  . Referral    CAT scan for hereditary aneurysm, per pt    HPI  A week ago she was having pain in her calves.  Though - could be K since she was not dehydrated.  Had flair of left sciatica - she tries to sleep through it - has not been using any otc meds.  Spotting every day with some cramps so has been bleeding since May.    Doing "live well" challenge through work - was 50 when she weighed.  40 lbs in past year. watching diet, no soda. Walking 3x/wk.  Walking 8000 steps/d.  Brother had a brain aneursym in Jan and then found to have aneursym in heart controlled on plavix, coreg, amlodipine with very tight BP control.  She was recommended to have a brain CTA by her brother's physician but she really doesn't have any more details.  Mother had CVA on lovenox but her father passed away when he was young so they don't know her family history. Her brother is having genetic testing done but no results yet.  Apparently, his physicians think that due to his current cardiac condition he most have had undiagnosed Kawasaki's when younger (so I wonder if he is having mitral regurg in addition).  His brain aneurysm has been treated with a coil.  Taking iron intermittenhtly since she gets constipationed - tried Mag 07 for the constipation which was very effective.  Depression screen [facility] 2/9 04/24/2016 02/08/2016 01/24/2016 10/25/2015 07/19/2015  Decreased Interest 0 0 0 0 0  Down, Depressed, Hopeless 1 0 0 0 0  PHQ - 2 Score 1 0 0 0 0     Past Medical History  Diagnosis Date  . Seizures ([place])   . Thyroid disease     hypo  . Hypertension   . OSA on CPAP    No past surgical history on file. Current Outpatient Prescriptions on File Prior to Visit  Medication Sig Dispense Refill  . ergocalciferol (VITAMIN D2) 50000 UNITS capsule Take 1 capsule  (50,000 Units total) by mouth once a week. 12 capsule 1  . hydrochlorothiazide (HYDRODIURIL) 25 MG tablet Take 1 tablet (25 mg total) by mouth daily. 90 tablet 3  . iron polysaccharides (NIFEREX) 150 MG capsule Take 1 capsule (150 mg total) by mouth daily. 90 capsule 1  . levothyroxine (SYNTHROID, LEVOTHROID) 88 MCG tablet Take 88 mcg by mouth daily before breakfast.    . losartan (COZAAR) 100 MG tablet Take 1 tablet (100 mg total) by mouth daily. 90 tablet 3   No current facility-administered medications on file prior to visit.   No Known Allergies Family History  Problem Relation Age of Onset  . Hypertension Mother   . Hypertension Brother   . Diabetes Paternal Grandfather   . Renal Disease Father    Social History   Social History  . Marital Status: Single    Spouse Name: N/A  . Number of Children: 1  . Years of Education: HS   Occupational History  . 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 Smoking status: Never Smoker   . Smokeless tobacco: None  . Alcohol Use: No  . Drug Use: No  . Sexual Activity: Yes    Birth Control/ Protection: Condom   Other Topics Concern  .  None   Social History Narrative   Single.Exercise 3 times/week Zumba for 1 hour   Drinks about 2-3 cups of coffee/tea a day. Education: [employer].    Review of Systems  Constitutional: Positive for activity change and fatigue. Negative for fever, chills, diaphoresis and appetite change.  Eyes: Negative for visual disturbance.  Respiratory: Negative for cough and shortness of breath.   Cardiovascular: Negative for chest pain, palpitations and leg swelling.  Gastrointestinal: Positive for abdominal pain and constipation. Negative for vomiting and diarrhea.  Genitourinary: Positive for vaginal bleeding, menstrual problem and pelvic pain. Negative for decreased urine volume.  Musculoskeletal: Positive for myalgias, back pain, arthralgias and gait problem. Negative for joint  swelling.  Neurological: Negative for syncope and headaches.  Hematological: Does not bruise/bleed easily.  Psychiatric/Behavioral: Positive for dysphoric mood. Negative for suicidal ideas, sleep disturbance and self-injury. The patient is not nervous/anxious.        Objective:  BP 124/82 mmHg  Pulse 80  Temp(Src) 98.5 F (36.9 C)  Resp 16  Ht 5\' 6"  (1.676 m)  Wt 243 lb (110.224 kg)  BMI 39.24 kg/m2  Physical Exam  Constitutional: She is oriented to person, place, and time. She appears well-developed and well-nourished. No distress.  HENT:  Head: Normocephalic and atraumatic.  Right Ear: External ear normal.  Left Ear: External ear normal.  Eyes: Conjunctivae are normal. No scleral icterus.  Neck: Normal range of motion. Neck supple. No thyromegaly present.  Cardiovascular: Normal rate, regular rhythm, normal heart sounds and intact distal pulses.   Pulmonary/Chest: Effort normal and breath sounds normal. No respiratory distress.  Musculoskeletal: She exhibits no edema.  Lymphadenopathy:    She has no cervical adenopathy.  Neurological: She is alert and oriented to person, place, and time.  Skin: Skin is warm and dry. She is not diaphoretic. No erythema.  Psychiatric: She has a normal mood and affect. Her behavior is normal.          Results for orders placed or performed in visit on 04/24/16  Ferritin  Result Value Ref Range   Ferritin 36 10 - 232 ng/mL  Basic metabolic panel  Result Value Ref Range   Sodium 136 135 - 146 mmol/L   Potassium 3.9 3.5 - 5.3 mmol/L   Chloride 101 98 - 110 mmol/L   CO2 24 20 - 31 mmol/L   Glucose, Bld 84 65 - 99 mg/dL   BUN 25 7 - 25 mg/dL   Creat 0.76 0.50 - 1.10 mg/dL   Calcium 8.7 8.6 - 10.2 mg/dL  POCT CBC  Result Value Ref Range   WBC 8.6 4.6 - 10.2 K/uL   Lymph, poc 3.4 0.6 - 3.4   POC LYMPH PERCENT 39.7 10 - 50 %L   MID (cbc) 0.2 0 - 0.9   POC MID % 2.8 0 - 12 %M   POC Granulocyte 4.9 2 - 6.9   Granulocyte percent 57.5  37 - 80 %G   RBC 4.24 4.04 - 5.48 M/uL   Hemoglobin 10.4 (A) 12.2 - 16.2 g/dL   HCT, POC 30.7 (A) 37.7 - 47.9 %   MCV 72.4 (A) 80 - 97 fL   MCH, POC 24.5 (A) 27 - 31.2 pg   MCHC 33.8 31.8 - 35.4 g/dL   RDW, POC 15.6 %   Platelet Count, POC 269 142 - 424 K/uL   MPV 8.1 0 - 99.8 fL   Assessment & Plan:    1. Hypothyroidism, unspecified hypothyroidism type -  pt follows with Dr. Chalmers Cater - still of levothyroxine 112 and has been stable.  2. Essential hypertension - cont losartan 100, hctz 25  3. Morbid obesity, unspecified obesity type ([place])   4. Vitamin D deficiency   5. Menorrhagia with regular cycle - pt had had over 6 mos of heavy irregular freq menstrual bleeding on Depo-Provera so will change to OCPs. Cont iron supp as tolerated by GI  6. Anemia, iron deficiency   7.      Brain and cardiac aneurysms in brother - his physicians recommended that pt be screened so will order head CTA and refer to vascular.  Pt did have a chest CT scan in 2014 that showed slight cardiomegaly, dilation of blood vessels in RUL of chest so this may be worth repeating to demonstrate stability now - will appreciate vascular's opinion.  Orders Placed This Encounter  Procedures  . Ferritin  . Basic metabolic panel  . POCT CBC    Meds ordered this encounter  Medications  . norgestimate-ethinyl estradiol (ORTHO-CYCLEN,SPRINTEC,PREVIFEM) 0.25-35 MG-MCG tablet    Sig: Take 1 pill a day continuously eliminating placebo pill week    Dispense:  3 Package    Refill:  4     [name], MD MPH

## 2016-04-28 DIAGNOSIS — G4733 Obstructive sleep apnea (adult) (pediatric): Secondary | ICD-10-CM | POA: Diagnosis not present

## 2016-05-01 ENCOUNTER — Encounter: Payer: Self-pay | Admitting: Family Medicine

## 2016-05-01 NOTE — Telephone Encounter (Signed)
Dr. Brigitte Pulse,  I'm not seeing any recent referrals entered for pt; I may have overlooked. In your note you mentioned vascular consult and brain CTA. Would you like those to be ordered?

## 2016-05-07 ENCOUNTER — Encounter: Payer: Self-pay | Admitting: Family Medicine

## 2016-05-17 ENCOUNTER — Ambulatory Visit (INDEPENDENT_AMBULATORY_CARE_PROVIDER_SITE_OTHER): Payer: 59 | Admitting: Family Medicine

## 2016-05-17 VITALS — BP 126/70 | HR 78 | Temp 98.0°F | Resp 16 | Ht 65.5 in | Wt 243.4 lb

## 2016-05-17 DIAGNOSIS — M5136 Other intervertebral disc degeneration, lumbar region: Secondary | ICD-10-CM | POA: Diagnosis not present

## 2016-05-17 DIAGNOSIS — M5416 Radiculopathy, lumbar region: Secondary | ICD-10-CM | POA: Diagnosis not present

## 2016-05-17 DIAGNOSIS — D509 Iron deficiency anemia, unspecified: Secondary | ICD-10-CM

## 2016-05-17 DIAGNOSIS — N921 Excessive and frequent menstruation with irregular cycle: Secondary | ICD-10-CM

## 2016-05-17 DIAGNOSIS — R252 Cramp and spasm: Secondary | ICD-10-CM | POA: Diagnosis not present

## 2016-05-17 MED ORDER — METHYLPREDNISOLONE 4 MG PO TBPK: ORAL_TABLET | ORAL | Status: DC

## 2016-05-17 MED ORDER — ESTROGENS CONJUGATED 1.25 MG PO TABS: 2.5000 mg | ORAL_TABLET | Freq: Four times a day (QID) | ORAL | Status: DC | PRN

## 2016-05-17 MED ORDER — ESTRADIOL 2 MG PO TABS: 8.0000 mg | ORAL_TABLET | Freq: Four times a day (QID) | ORAL | Status: DC | PRN

## 2016-05-17 MED ORDER — MEDROXYPROGESTERONE ACETATE 10 MG PO TABS: 10.0000 mg | ORAL_TABLET | Freq: Every day | ORAL | Status: DC

## 2016-05-17 MED ORDER — METHYLPREDNISOLONE ACETATE 80 MG/ML IJ SUSP
120.0000 mg | Freq: Once | INTRAMUSCULAR | Status: AC
  Administered 2016-05-17: 120 mg via INTRAMUSCULAR

## 2016-05-17 MED ORDER — ONDANSETRON 4 MG PO TBDP: 4.0000 mg | ORAL_TABLET | Freq: Three times a day (TID) | ORAL | Status: DC | PRN

## 2016-05-17 NOTE — Progress Notes (Addendum)
By signing my name below I, Pam Mckee, attest that this documentation has been prepared under the direction and in the presence of Pam Mckee. Electonically Signed. Pam Mckee, Scribe 05/17/2016 at 2:59 PM  Subjective:    Patient ID: Pam Mckee, female    DOB: [birth year], 45 y.o.   MRN: [id number]  Chief Complaint  Patient presents with  . Leg Pain    Left Leg x  3 weeks    HPI Pam Mckee is a 45 y.o. female who presents to the [facility] complaining of bilat leg burning pain for the past 3 weeks. Pt states pain is the same as her chronic left sided sciatica pain, except now it is bilat. Pt reports that she has also been having lateral left leg pain that feels like a "charlie horse". Pt denies any bowel or urinary incontinence, N/V, fever, chills, or dysuria. Pt reports that she has been tripping more frequently. Pt is sleeping normally.  Pt reports that she has been intentionally losing weight and feeling much better.   Pt reports that she has been intentionally skipping doses of her iron supplements because they cause constipation even while taking fiber supplements.   Pt has low back pain from lumbar DDD and left sided sciatica. MRI confirms bulging disc at L2/L3 and L4/L5 compromising rt L2 and left L4 nerve roots. Pt was seen by neurosurgery Pam Mckee. Recommended conservative management. Pt states she was also recommended an epidural shot for pain management and she was nervous to try it.   Pt reports that she is still having menstrual cycle despite taking the norgestimate-ethinyl estradiol that was started a month ago due to 6 months of menorrhagia with regular cycle and break through bleeding while on depo-provera shot.    Patient Active Problem List   Diagnosis Date Noted  . Left hip pain 07/10/2015  . Lumbago 07/10/2015  . Hypothyroidism 06/06/2015  . 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 Obstructive sleep apnea 05/18/2015  . Breast  calcifications on mammogram 05/18/2015  . Menorrhagia with regular cycle 05/18/2015  . Left knee pain 05/18/2015  . Severe obesity (BMI >= 40) ([place]) 03/15/2015  . Thyroid activity decreased 03/15/2015  . Vitamin D deficiency 03/15/2015  . Olecranon bursitis of right elbow 02/15/2015  . Essential hypertension 02/15/2015    Current Outpatient Prescriptions on File Prior to Visit  Medication Sig Dispense Refill  . ergocalciferol (VITAMIN D2) 50000 UNITS capsule Take 1 capsule (50,000 Units total) by mouth once a week. 12 capsule 1  . hydrochlorothiazide (HYDRODIURIL) 25 MG tablet Take 1 tablet (25 mg total) by mouth daily. 90 tablet 3  . iron polysaccharides (NIFEREX) 150 MG capsule Take 1 capsule (150 mg total) by mouth daily. 90 capsule 1  . levothyroxine (SYNTHROID, LEVOTHROID) 88 MCG tablet Take 112 mcg by mouth daily before breakfast.    . losartan (COZAAR) 100 MG tablet Take 1 tablet (100 mg total) by mouth daily. 90 tablet 3  . norgestimate-ethinyl estradiol (ORTHO-CYCLEN,SPRINTEC,PREVIFEM) 0.25-35 MG-MCG tablet Take 1 pill a day continuously eliminating placebo pill week 3 Package 4   No current facility-administered medications on file prior to visit.    No Known Allergies  Depression screen [name] 2/9 05/17/2016 04/24/2016 02/08/2016 01/24/2016 10/25/2015  Decreased Interest 0 0 0 0 0  Down, Depressed, Hopeless 0 1 0 0 0  PHQ - 2 Score 0 1 0 0 0        Review of Systems  Constitutional: Negative for fever.  HENT: Negative for congestion.   Eyes: Negative for visual disturbance.  Respiratory: Negative for cough.   Cardiovascular: Negative for chest pain.  Gastrointestinal: Negative for nausea, vomiting, abdominal pain and diarrhea.  Genitourinary: Negative for dysuria and difficulty urinating.  Musculoskeletal: Positive for back pain (low, with bilat sciatica down both legs. ).  Skin: Negative for rash.  Neurological: Negative for headaches.  Psychiatric/Behavioral:  Negative for sleep disturbance and agitation.       Objective:  BP 126/70 mmHg  Pulse 78  Temp(Src) 98 F (36.7 C) (Oral)  Resp 16  Ht 5' 5.5" (1.664 m)  Wt 243 lb 6.4 oz (110.406 kg)  BMI 39.87 kg/m2  SpO2 99%  LMP 05/17/2016  Physical Exam  Constitutional: She is oriented to person, place, and time. She appears well-developed and well-nourished. No distress.  HENT:  Head: Normocephalic and atraumatic.  Eyes: Conjunctivae are normal. Pupils are equal, round, and reactive to light.  Neck: Neck supple.  Cardiovascular: Normal rate.   Pulmonary/Chest: Effort normal.  Musculoskeletal: Normal range of motion.       Lumbar back: She exhibits no bony tenderness (no midline tenderness).  No point tenderness over SI joints or the greater trochanter muscles.  4/5 strength in left hip flexor. 5/5 strength in rt hip flexor.   Neurological: She is alert and oriented to person, place, and time.  Reflex Scores:      Patellar reflexes are 1+ on the right side and 1+ on the left side.      Achilles reflexes are 1+ on the right side and 1+ on the left side. Positive straight leg raise on left.  Skin: Skin is warm and dry.  Psychiatric: She has a normal mood and affect. Her behavior is normal.  Nursing note and vitals reviewed.         Assessment & Plan:   1. DDD (degenerative disc disease), lumbar - seeing Pam. Christella Mckee w/ neurosurg  2. Radiculopathy of lumbar region   3. Menorrhagia with irregular cycle - failed Depo-provera and  OCPs so refer to gyn for further eval/trx - may respond to IUD.  Cannot afford premarin so start estradiol 8mg  (= 4 tabs) qid until bleeding subsides (and not longer than 3 wks) then start provera 10mg  qd x 10d, then restart OCPs.   4. Anemia, iron deficiency - Cont iron supp  5. Cramps of left lower extremity - cons trying mag 400u qhs prn, start exercising   6. Fam Hx aneursym - cardiac and brain in brother. Pt was told by brother's physician to consider  screening for this but vascular surg states they do not see pt's for this so rec that she discuss this with her neurosurg Pam. Christella Mckee who she is still seeing for her lumbar DDD with radiculopathy.  Orders Placed This Encounter  Procedures  . Ambulatory referral to Gynecology    Referral Priority:  Routine    Referral Type:  Consultation    Referral Reason:  Specialty Services Required    Requested Specialty:  Gynecology    Number of Visits Requested:  1    Meds ordered this encounter  Medications  . methylPREDNISolone acetate (DEPO-MEDROL) injection 120 mg    Sig:   . estrogens, conjugated, (PREMARIN) 1.25 MG tablet    Sig: Take 2 tablets (2.5 mg total) by mouth 4 (four) times daily as needed (until menses stop).    Dispense:  40 tablet    Refill:  0  .  ondansetron (ZOFRAN ODT) 4 MG disintegrating tablet    Sig: Take 1 tablet (4 mg total) by mouth every 8 (eight) hours as needed for nausea or vomiting.    Dispense:  20 tablet    Refill:  0  . medroxyPROGESTERone (PROVERA) 10 MG tablet    Sig: Take 1 tablet (10 mg total) by mouth daily.    Dispense:  10 tablet    Refill:  0  . DISCONTD: methylPREDNISolone (MEDROL DOSEPAK) 4 MG TBPK tablet    Sig: 6-5-4-3-2-1 tabs po qd    Dispense:  21 tablet    Refill:  0  . methylPREDNISolone (MEDROL DOSEPAK) 4 MG TBPK tablet    Sig: 6-5-4-3-2-1 tabs po qd    Dispense:  21 tablet    Refill:  0  . estradiol (ESTRACE) 2 MG tablet    Sig: Take 4 tablets (8 mg total) by mouth 4 (four) times daily as needed (until menses stop).    Dispense:  80 tablet    Refill:  0    Instead of the premarin due to expense    I personally performed the services described in this documentation, which was scribed in my presence. The recorded information has been reviewed and considered, and addended by me as needed.   Pam Mckee, M.D.  [facility] [address] [place], [place] [address] [phone] phone [phone]  fax  05/24/2016 10:04 PM

## 2016-05-17 NOTE — Patient Instructions (Addendum)
     IF you received an x-ray today, you will receive an invoice from [facility]. Please contact [facility] at [phone] with questions or concerns regarding your invoice.   IF you received labwork today, you will receive an invoice from [employer]. Please contact [facility] at [phone] with questions or concerns regarding your invoice.   Our billing staff will not be able to assist you with questions regarding bills from these companies.  You will be contacted with the lab results as soon as they are available. The fastest way to get your results is to activate your My Chart account. Instructions are located on the last page of this paperwork. If you have not heard from [place] regarding the results in 2 weeks, please contact this office.    Premarin 2.5 mg four times per day until the bleeding subsides or is minimal. Do not continue this regimen for more than 21 to 25 days. After the estrogen is discontinued, start oral medroxyprogesterone acetate (10 mg/day) for 10 days.  After this you can restart your birth control pills but hopefully you will be in with gynecology by then.  This will probably make you nauseas so use zofran as needed. No smoke exposure and get some exercise.  Consider taking magnesium 400u at night if you continue to have any more cramps.    Lets get you back in with Dr. Christella Noa for guidance on screening due to your brother's aneurysm as well.

## 2016-05-19 ENCOUNTER — Telehealth: Payer: Self-pay | Admitting: Obstetrics and Gynecology

## 2016-05-19 MED FILL — ESTRADIOL 2 MG TABLET: 2 | 10 days supply | Qty: 80 | Fill #0

## 2016-05-19 MED FILL — METHYLPREDNISOLONE 4 MG TAB: 4 | 6 days supply | Qty: 21 | Fill #0

## 2016-05-19 MED FILL — ONDANSETRON ODT 4 MG TABLET: 4 | 7 days supply | Qty: 20 | Fill #0

## 2016-05-19 NOTE — Telephone Encounter (Signed)
Called and left a message for patient to call back to schedule a new patient doctor referral. °

## 2016-05-19 NOTE — Telephone Encounter (Signed)
Patient is returning a call to Starla. °

## 2016-05-28 ENCOUNTER — Encounter: Payer: Self-pay | Admitting: Obstetrics and Gynecology

## 2016-05-28 ENCOUNTER — Ambulatory Visit (INDEPENDENT_AMBULATORY_CARE_PROVIDER_SITE_OTHER): Payer: 59 | Admitting: Obstetrics and Gynecology

## 2016-05-28 VITALS — BP 128/76 | HR 72 | Resp 16 | Ht 66.0 in | Wt 238.0 lb

## 2016-05-28 DIAGNOSIS — D259 Leiomyoma of uterus, unspecified: Secondary | ICD-10-CM | POA: Diagnosis not present

## 2016-05-28 DIAGNOSIS — N949 Unspecified condition associated with female genital organs and menstrual cycle: Secondary | ICD-10-CM | POA: Diagnosis not present

## 2016-05-28 DIAGNOSIS — D649 Anemia, unspecified: Secondary | ICD-10-CM

## 2016-05-28 DIAGNOSIS — N939 Abnormal uterine and vaginal bleeding, unspecified: Secondary | ICD-10-CM

## 2016-05-28 DIAGNOSIS — N858 Other specified noninflammatory disorders of uterus: Secondary | ICD-10-CM | POA: Diagnosis not present

## 2016-05-28 LAB — CBC
HCT: 34 % — ABNORMAL LOW (ref 35.0–45.0)
Hemoglobin: 10.3 g/dL — ABNORMAL LOW (ref 11.7–15.5)
MCH: 23.3 pg — ABNORMAL LOW (ref 27.0–33.0)
MCHC: 30.3 g/dL — ABNORMAL LOW (ref 32.0–36.0)
MCV: 76.9 fL — ABNORMAL LOW (ref 80.0–100.0)
MPV: 9.9 fL (ref 7.5–12.5)
Platelets: 374 10*3/uL (ref 140–400)
RBC: 4.42 MIL/uL (ref 3.80–5.10)
RDW: 15.5 % — ABNORMAL HIGH (ref 11.0–15.0)
WBC: 8.8 10*3/uL (ref 3.8–10.8)

## 2016-05-28 LAB — FERRITIN: Ferritin: 22 ng/mL (ref 10–232)

## 2016-05-28 MED ORDER — MEDROXYPROGESTERONE ACETATE 10 MG PO TABS: ORAL_TABLET | ORAL | Status: DC

## 2016-05-28 MED FILL — MEDROXYPROGESTERONE 10 MG T: 10 | 5 days supply | Qty: 10 | Fill #0

## 2016-05-28 NOTE — Progress Notes (Signed)
GYNECOLOGY  VISIT   HPI: 45 y.o.   Single  African American  female   G2P21 with Patient's last menstrual period was 05/17/2016.   Consultation from Dr [name] for abnormal uterine bleeding. She has a h/o heavy cycles. Prior to getting her first Depo-Provera shot in 11/16 she was bleeding monthly x 7 days. She was saturating a pad in 2-3 hours. No cramps. She was started on Depo-Provera for menorrhagia and anemia in the fall. After the first shot of Depo-Provera she didn't have a cycle. After her second shot of Depo-Provera she had abnormal bleeding from the end of January through February and March. Then she started on OCP's in May, continued to bleed, so she stopped the pill last week. Last week she was started on oral estradiol, taking 2 mg every 6 hours. Still has a few left. She has progesterone tablets hasn't started them yet. Currently just spotting. Last week she was using 2 pads at a time and was changing them every 2-3 hours.  She has mild anemia with a normal ferritin level from 2/17 and had a normal TSH in 11/16. She reports a normal TSH with her endocrinologist in 2/17. She is sexually active, same partner x 2 years, no contraception. She has a 23 year old son, hasn't used contraception since he was born.  Negative pap and negative hpv in 6/16   Period Cycle (days) - patient has had cycle since March Period Duration (days) - typically 7 days Period Pattern - Irregular on depo-provera Menstrual Flow - Heavy Menstrual Control - Panty Liner; maxi pad Menstrual Control Change Freq - 2 or more times, depending on the day Dysmenorrhea - none  GYNECOLOGIC HISTORY: Patient's last menstrual period was 05/17/2016. Contraception:Estradiol Menopausal hormone therapy:         OB History    Gravida Para Term Preterm AB TAB SAB Ectopic Multiple Living   2 2 1 1  0 0 0 0 0 1         Patient Active Problem List   Diagnosis Date Noted  . Left hip pain 07/10/2015  . Lumbago 07/10/2015  .  Hypothyroidism 06/06/2015  . 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 Obstructive sleep apnea 05/18/2015  . Breast calcifications on mammogram 05/18/2015  . Menorrhagia with regular cycle 05/18/2015  . Left knee pain 05/18/2015  . Severe obesity (BMI >= 40) ([place]) 03/15/2015  . Thyroid activity decreased 03/15/2015  . Vitamin D deficiency 03/15/2015  . Olecranon bursitis of right elbow 02/15/2015  . Essential hypertension 02/15/2015    Past Medical History  Diagnosis Date  . Seizures ([place])   . Thyroid disease     hypo  . Hypertension   . OSA on CPAP   . Anemia   Seizure free since 16  History reviewed. No pertinent past surgical history.  Current Outpatient Prescriptions  Medication Sig Dispense Refill  . ergocalciferol (VITAMIN D2) 50000 UNITS capsule Take 1 capsule (50,000 Units total) by mouth once a week. 12 capsule 1  . hydrochlorothiazide (HYDRODIURIL) 25 MG tablet Take 1 tablet (25 mg total) by mouth daily. 90 tablet 3  . iron polysaccharides (NIFEREX) 150 MG capsule Take 1 capsule (150 mg total) by mouth daily. 90 capsule 1  . levothyroxine (SYNTHROID, LEVOTHROID) 88 MCG tablet Take 112 mcg by mouth daily before breakfast.    . losartan (COZAAR) 100 MG tablet Take 1 tablet (100 mg total) by mouth daily. 90 tablet 3  . ondansetron (ZOFRAN ODT) 4 MG  disintegrating tablet Take 1 tablet (4 mg total) by mouth every 8 (eight) hours as needed for nausea or vomiting. 20 tablet 0  . ALL DAY ALLERGY 10 MG tablet Take 10 mg by mouth as needed.  11  . estradiol (ESTRACE) 2 MG tablet Take 4 tablets (8 mg total) by mouth 4 (four) times daily as needed (until menses stop). (Patient not taking: Reported on 05/28/2016) 80 tablet 0  . medroxyPROGESTERone (PROVERA) 10 MG tablet Take 1 tablet (10 mg total) by mouth daily. (Patient not taking: Reported on 05/28/2016) 10 tablet 0  . medroxyPROGESTERone (PROVERA) 10 MG tablet You can take up to 2 tablets a day, can add this to your other script  of provera if needed to get a full 10 days. 10 tablet 0  . methylPREDNISolone (MEDROL DOSEPAK) 4 MG TBPK tablet 6-5-4-3-2-1 tabs po qd (Patient not taking: Reported on 05/28/2016) 21 tablet 0   No current facility-administered medications for this visit.     ALLERGIES: Review of patient's allergies indicates no known allergies.  Family History  Problem Relation Age of Onset  . Hypertension Mother   . Hypertension Brother   . Aneurysm Brother   . Diabetes Paternal Grandfather   . Renal Disease Father     Social History   Social History  . Marital Status: Single    Spouse Name: N/A  . Number of Children: 1  . Years of Education: HS   Occupational History  . 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 Smoking status: Never Smoker   . Smokeless tobacco: Not on file  . Alcohol Use: No  . Drug Use: No  . Sexual Activity: Yes    Birth Control/ Protection: Condom   Other Topics Concern  . Not on file   Social History Narrative   Single.Exercise 3 times/week Zumba for 1 hour   Drinks about 2-3 cups of coffee/tea a day. Education: [employer].    Review of Systems  Constitutional: Negative.   HENT: Negative.   Eyes: Negative.   Respiratory: Negative.   Cardiovascular: Negative.   Gastrointestinal: Negative.   Genitourinary: Negative.   Musculoskeletal: Positive for back pain.  Skin: Negative.   Neurological: Negative.   Endo/Heme/Allergies: Negative.   Psychiatric/Behavioral: Negative.     PHYSICAL EXAMINATION:    BP 128/76 mmHg  Pulse 72  Resp 16  Ht 5\' 6"  (1.676 m)  Wt 238 lb (107.956 kg)  BMI 38.43 kg/m2  LMP 05/17/2016    General appearance: alert, cooperative and appears stated age Neck: no adenopathy, supple, symmetrical, trachea midline and thyroid normal to inspection and palpation Heart: regular rate and rhythm Lungs: CTAB Abdomen: soft, non-tender; bowel sounds normal; no masses,  no organomegaly Lungs: CTAB Extremities:  normal, atraumatic, no cyanosis Skin: normal color, texture and turgor, no rashes or lesions Lymph: normal cervical supraclavicular and inguinal nodes Neurologic: grossly normal   Pelvic: External genitalia:  no lesions              Urethra:  normal appearing urethra with no masses, tenderness or lesions              Bartholins and Skenes: normal                 Vagina: normal appearing vagina with normal color and discharge, no lesions              Cervix: no lesions  Bimanual Exam:  Uterus:  enlarged, irregularly shaped, decreased mobility. C/W fibroid uterus? 10+ week sized, so irregular hard to qualify.               Adnexa: no mass, fullness, tenderness              Rectovaginal: Yes.  .  Confirms.              Anus:  normal sphincter tone, no lesions  The risks of endometrial biopsy were reviewed and a consent was obtained.  A speculum was placed in the vagina and the cervix was cleansed with betadine. A tenaculum was placed on the cervix and the mini-pipelle was placed into the endometrial cavity. The uterus sounded to 10+ cm. The endometrial biopsy was performed, only serous/blood was obtained with 2 passes. The uterine explora curette was then used, a large amount of blood and tissue was obtained. The cavity felt gritty at the end of the procedure. The curette, tenaculum and speculum were removed. There were no complications.    Chaperone was present for exam.  ASSESSMENT Abnormal uterine bleeding H/O anemia Fibroid uterus on exam    PLAN Negative UPT Endometrial biopsy done Stop the estradiol Start provera today, 10 mg, 1 tab po qd. If she is bleeding I told her to go up to 2 tablets a day (given a script for another 10 tablets) CBC, Ferritin F/U for an ultrasound in 3 weeks (after cycle), possible sonohysterogram Further plan after evaluation is complete   An After Visit Summary was printed and given to the patient.    CC: [name], MD Letter sent

## 2016-05-29 ENCOUNTER — Telehealth: Payer: Self-pay | Admitting: Obstetrics and Gynecology

## 2016-05-29 ENCOUNTER — Other Ambulatory Visit: Payer: 59

## 2016-05-29 DIAGNOSIS — G4733 Obstructive sleep apnea (adult) (pediatric): Secondary | ICD-10-CM | POA: Diagnosis not present

## 2016-05-29 NOTE — Telephone Encounter (Signed)
Returning your call after  30 mins try this number [phone]

## 2016-05-29 NOTE — Telephone Encounter (Signed)
Called patient to review benefits for a recommended procedure. Left Voicemail requesting a call back. °

## 2016-06-02 MED FILL — MEDROXYPROGESTERONE 10 MG T: 10 | 5 days supply | Qty: 10 | Fill #0

## 2016-06-03 MED FILL — LEVOTHYROXINE 112 MCG TAB: 112 | 30 days supply | Qty: 30 | Fill #6

## 2016-06-04 ENCOUNTER — Telehealth: Payer: Self-pay | Admitting: *Deleted

## 2016-06-04 NOTE — Telephone Encounter (Signed)
-----   Message from Nunzio Cobbs, MD sent at 06/03/2016  8:43 PM EDT ----- This is Dr. Quincy Simmonds reviewing Dr. Gentry Fitz in box while she is out of the office.   Please report results of endometrial biopsy to patient showing polypoid, secretory endometrium.  There is no sign of hyperplasia or malignancy.  Please keep appointment for ultrasound with Dr. Talbert Nan on July 11.  This will be important for her to follow through with this ultrasound appointment.  Cc- Starlyn Skeans

## 2016-06-04 NOTE — Telephone Encounter (Signed)
Left message for patient to call in regards to results -eh

## 2016-06-04 NOTE — Telephone Encounter (Signed)
Spoke with patient- she is aware of results- please see results note -eh

## 2016-06-07 HISTORY — PX: INTRAUTERINE DEVICE (IUD) INSERTION: SHX5877

## 2016-06-17 ENCOUNTER — Encounter: Payer: Self-pay | Admitting: Obstetrics and Gynecology

## 2016-06-17 ENCOUNTER — Ambulatory Visit (INDEPENDENT_AMBULATORY_CARE_PROVIDER_SITE_OTHER): Payer: 59 | Admitting: Obstetrics and Gynecology

## 2016-06-17 ENCOUNTER — Ambulatory Visit (INDEPENDENT_AMBULATORY_CARE_PROVIDER_SITE_OTHER): Payer: 59

## 2016-06-17 VITALS — BP 110/70 | HR 84 | Resp 15 | Wt 230.0 lb

## 2016-06-17 DIAGNOSIS — R252 Cramp and spasm: Secondary | ICD-10-CM

## 2016-06-17 DIAGNOSIS — N939 Abnormal uterine and vaginal bleeding, unspecified: Secondary | ICD-10-CM

## 2016-06-17 DIAGNOSIS — D509 Iron deficiency anemia, unspecified: Secondary | ICD-10-CM | POA: Diagnosis not present

## 2016-06-17 DIAGNOSIS — D259 Leiomyoma of uterus, unspecified: Secondary | ICD-10-CM

## 2016-06-17 NOTE — Patient Instructions (Signed)
Take iron tablets  You can take magnesium, 500 mg every day as needed for constipation

## 2016-06-17 NOTE — Progress Notes (Signed)
GYNECOLOGY  VISIT   HPI: 45 y.o.   Single  African American  female   [id number] with No LMP recorded.   here for abnormal uterine bleeding. She has a h/o menometorrhagia with anemia. Prior to the depo-provera in the fall she was having monthly cycles.  She had abnormal bleeding from the depo-provera, then abnormal bleeding on OCP's. She was then treated with provera, bleed the entire time on provera, just stopped bleeding 2 days ago. She was changing 3 pads a day.  She is sexually active without protection.  She c/o increased leg cramps at night, very tight and painful.   GYNECOLOGIC HISTORY: No LMP recorded. Contraception:None  Menopausal hormone therapy: none         OB History    Gravida Para Term Preterm AB TAB SAB Ectopic Multiple Living   2 2 1 1  0 0 0 0 0 1         Patient Active Problem List   Diagnosis Date Noted  . Left hip pain 07/10/2015  . Lumbago 07/10/2015  . Hypothyroidism 06/06/2015  . 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 Obstructive sleep apnea 05/18/2015  . Breast calcifications on mammogram 05/18/2015  . Menorrhagia with regular cycle 05/18/2015  . Left knee pain 05/18/2015  . Severe obesity (BMI >= 40) ([place]) 03/15/2015  . Thyroid activity decreased 03/15/2015  . Vitamin D deficiency 03/15/2015  . Olecranon bursitis of right elbow 02/15/2015  . Essential hypertension 02/15/2015    Past Medical History  Diagnosis Date  . Seizures ([place])   . Thyroid disease     hypo  . Hypertension   . OSA on CPAP   . Anemia     History reviewed. No pertinent past surgical history.  Current Outpatient Prescriptions  Medication Sig Dispense Refill  . ergocalciferol (VITAMIN D2) 50000 UNITS capsule Take 1 capsule (50,000 Units total) by mouth once a week. 12 capsule 1  . hydrochlorothiazide (HYDRODIURIL) 25 MG tablet Take 1 tablet (25 mg total) by mouth daily. 90 tablet 3  . iron polysaccharides (NIFEREX) 150 MG capsule Take 1 capsule (150 mg total)  by mouth daily. 90 capsule 1  . levothyroxine (SYNTHROID, LEVOTHROID) 88 MCG tablet Take 112 mcg by mouth daily before breakfast.    . losartan (COZAAR) 100 MG tablet Take 1 tablet (100 mg total) by mouth daily. 90 tablet 3  . ondansetron (ZOFRAN ODT) 4 MG disintegrating tablet Take 1 tablet (4 mg total) by mouth every 8 (eight) hours as needed for nausea or vomiting. 20 tablet 0   No current facility-administered medications for this visit.     ALLERGIES: Review of patient's allergies indicates no known allergies.  Family History  Problem Relation Age of Onset  . Hypertension Mother   . Hypertension Brother   . Aneurysm Brother   . Diabetes Paternal Grandfather   . Renal Disease Father     Social History   Social History  . Marital Status: Single    Spouse Name: N/A  . Number of Children: 1  . 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 Smoking status: Never Smoker   . Smokeless tobacco: Never Used  . Alcohol Use: No  . Drug Use: No  . Sexual Activity:    Partners: Male    Birth Control/ Protection: Condom   Other Topics Concern  . Not on file   Social History  Narrative   Single.Exercise 3 times/week Zumba for 1 hour   Drinks about 2-3 cups of coffee/tea a day. Education: [employer].    Review of Systems  Constitutional: Negative.   HENT: Negative.   Eyes: Negative.   Respiratory: Negative.   Cardiovascular: Negative.   Gastrointestinal: Negative.   Genitourinary:       Abnormal uterine bleeding   Musculoskeletal: Negative.   Skin: Negative.   Neurological: Negative.   Endo/Heme/Allergies: Negative.   Psychiatric/Behavioral: Negative.     PHYSICAL EXAMINATION:    BP 110/70 mmHg  Pulse 84  Resp 15  Wt 230 lb (104.327 kg)  LMP     General appearance: alert, cooperative and appears stated age    Pelvic ultrasound images reviewed with the patient   ASSESSMENT AUB, fibroid  uterus, failed treatment with depo-provera and OCP's Benign endometrial biopsy, thin lining on ultrasound, fibroid deviating her cavity slightly, not amenable to hysteroscopic myomectomy Anemia from her bleeding Severe leg cramps     PLAN Reviewed images, discussed fibroids Discussed options of the mirena IUD, uterine artery embolization and TLH/BS Information given  CBC, Ferritin, CMP    An After Visit Summary was printed and given to the patient.  25 minutes face to face time of which over 50% was spent in counseling.

## 2016-06-18 ENCOUNTER — Other Ambulatory Visit: Payer: Self-pay | Admitting: Obstetrics and Gynecology

## 2016-06-18 ENCOUNTER — Other Ambulatory Visit: Payer: 59

## 2016-06-18 ENCOUNTER — Encounter: Payer: Self-pay | Admitting: Obstetrics and Gynecology

## 2016-06-18 ENCOUNTER — Telehealth: Payer: Self-pay

## 2016-06-18 DIAGNOSIS — R252 Cramp and spasm: Secondary | ICD-10-CM

## 2016-06-18 DIAGNOSIS — N939 Abnormal uterine and vaginal bleeding, unspecified: Secondary | ICD-10-CM

## 2016-06-18 DIAGNOSIS — D259 Leiomyoma of uterus, unspecified: Secondary | ICD-10-CM

## 2016-06-18 DIAGNOSIS — D509 Iron deficiency anemia, unspecified: Secondary | ICD-10-CM

## 2016-06-18 LAB — CBC
HCT: 34.7 % — ABNORMAL LOW (ref 35.0–45.0)
Hemoglobin: 10.7 g/dL — ABNORMAL LOW (ref 11.7–15.5)
MCH: 23.5 pg — ABNORMAL LOW (ref 27.0–33.0)
MCHC: 30.8 g/dL — ABNORMAL LOW (ref 32.0–36.0)
MCV: 76.3 fL — ABNORMAL LOW (ref 80.0–100.0)
MPV: 10 fL (ref 7.5–12.5)
Platelets: 373 10*3/uL (ref 140–400)
RBC: 4.55 MIL/uL (ref 3.80–5.10)
RDW: 15 % (ref 11.0–15.0)
WBC: 10.9 10*3/uL — ABNORMAL HIGH (ref 3.8–10.8)

## 2016-06-18 LAB — FERRITIN: Ferritin: 32 ng/mL (ref 10–232)

## 2016-06-18 NOTE — Telephone Encounter (Signed)
Spoke with patient. Advised I have spoken with Dr.Jertson and she states that the location of her fibroids should not interfere with IUD insertion. Advised fibroids in the uterus do increase her risk for expulsion of IUD and decrease chance of IUD being successful per Dr.Jertson. Patient desires to proceed with IUD. Aware she will need to contact the office with the first day of her next menses to schedule. Order for Mirena IUD placed for precert.  Cc: Lerry Liner  Routing to provider for final review. Patient agreeable to disposition. Will close encounter.

## 2016-06-18 NOTE — Telephone Encounter (Signed)
Spoke with patient while in office to have her lab work drawn. Advised of message as seen below from [place]. She is agreeable. Aware she will need to contact the office with the first day of her menses to schedule IUD insertion. Asking if the amount of fibroids she has within her uterus will interfere with the placement of her IUD. Advised I will speak with Dr.Jertson regarding fibroids and placement of IUD and return call. She is agreeable and verbalizes understanding.

## 2016-06-18 NOTE — Telephone Encounter (Signed)
Please set her up for a mirena, have Suzy check on cost for her. It needs to be put in during her menses, or after 2 weeks of no intercourse and a negative UPT.

## 2016-06-18 NOTE — Telephone Encounter (Signed)
Non-Urgent Medical Question  Message [id number]   From  Roney Jaffe   To  Salvadore Dom, MD   Sent  06/18/2016 11:44 AM     HELLO DR Talbert Nan ,               SO I WOULD LIKE TO TRY THE MIRENA TO SEE IF THAT WILL WORK BEFORE I DO A HYSTERECTOMY, DO I HAVE TO CHECK TO SEE IF MY INSURANCE WILL PAY FOR IT OR DO YOUR OFFICE LOOK INTO IT FOR ME. WOULD LIKE TO GET IT AS SOON AS POSSIBLE.                                    [place] YOU      Responsible Party    Pool - [facility] Clinical Pool No one has taken responsibility for this message.     No actions have been taken on this message.     Dr.Jertson, patient was seen on 06/17/2016 for PUS due to abnormal bleeding. Patient has decided she would like to proceed with Mirena IUD prior to surgery. Patient is not on any form of birth control. Per OV note she was previously taking Provera and stopped bleeding on 06/15/2016. Is it okay to proceed with precertification and scheduling at this time? Does patient need to wait until next menses for scheduling?

## 2016-06-18 NOTE — Telephone Encounter (Signed)
Left message to call Kaitlyn at [phone]. 

## 2016-06-18 NOTE — Telephone Encounter (Signed)
Telephone encounter created to discuss with Dr.Jertson.

## 2016-06-19 ENCOUNTER — Telehealth: Payer: Self-pay

## 2016-06-19 LAB — COMPREHENSIVE METABOLIC PANEL
ALT: 13 U/L (ref 6–29)
AST: 14 U/L (ref 10–30)
Albumin: 3.9 g/dL (ref 3.6–5.1)
Alkaline Phosphatase: 67 U/L (ref 33–115)
BUN: 23 mg/dL (ref 7–25)
CO2: 25 mmol/L (ref 20–31)
Calcium: 9.1 mg/dL (ref 8.6–10.2)
Chloride: 100 mmol/L (ref 98–110)
Creat: 0.89 mg/dL (ref 0.50–1.10)
Glucose, Bld: 88 mg/dL (ref 65–99)
Potassium: 3.8 mmol/L (ref 3.5–5.3)
Sodium: 138 mmol/L (ref 135–146)
Total Bilirubin: 0.3 mg/dL (ref 0.2–1.2)
Total Protein: 7 g/dL (ref 6.1–8.1)

## 2016-06-19 MED ORDER — IBUPROFEN 800 MG PO TABS: 800.0000 mg | ORAL_TABLET | Freq: Three times a day (TID) | ORAL | Status: DC | PRN

## 2016-06-19 MED ORDER — MISOPROSTOL 200 MCG PO TABS: ORAL_TABLET | ORAL | Status: DC

## 2016-06-19 MED FILL — miSOPROStol 200 MCG TABS: 200 | 1 days supply | Qty: 2 | Fill #0

## 2016-06-19 MED FILL — IBUPROFEN 800 MG TABLET: 800 | 10 days supply | Qty: 30 | Fill #0

## 2016-06-19 NOTE — Telephone Encounter (Signed)
Yes, I've sent in Ibuprofen for her.

## 2016-06-19 NOTE — Telephone Encounter (Signed)
Spoke with patient at time of incoming call. Patient states that she started her cycle today and would like to schedule her Mirena insertion. Patient is requesting an early morning appointment due to her work. Mirena insertion scheduled for 06/24/2016 at 8:15 am with Dr.Jertson. Patient is agreeable to date and time. Pre procedure instructions given.  Motrin instructions given. Motrin=Advil=Ibuprofen, 800 mg one hour before appointment. Eat a meal and hydrate well before appointment. Cytotec instructions given. Place 2 tablets vaginally 6-12 hours prior to procedure. Rx for Cytotec 200 mcg #2 0RF sent to pharmacy on file. Patient is agreeable. Patient is requesting an rx for Ibuprofen 800 mg so that she does not have to take 4 tablets of OTC Ibuprofen. Advised I will speak with Dr.Jertson regarding that prescription and return call. She is agreeable.  Dr.Jertson, okay to send in rx for Ibuprofen 800 mg?

## 2016-06-24 ENCOUNTER — Ambulatory Visit (INDEPENDENT_AMBULATORY_CARE_PROVIDER_SITE_OTHER): Payer: 59 | Admitting: Obstetrics and Gynecology

## 2016-06-24 VITALS — BP 138/78 | HR 78 | Resp 14 | Wt 233.0 lb

## 2016-06-24 DIAGNOSIS — Z113 Encounter for screening for infections with a predominantly sexual mode of transmission: Secondary | ICD-10-CM

## 2016-06-24 DIAGNOSIS — Z01812 Encounter for preprocedural laboratory examination: Secondary | ICD-10-CM | POA: Diagnosis not present

## 2016-06-24 DIAGNOSIS — N939 Abnormal uterine and vaginal bleeding, unspecified: Secondary | ICD-10-CM | POA: Diagnosis not present

## 2016-06-24 DIAGNOSIS — Z30430 Encounter for insertion of intrauterine contraceptive device: Secondary | ICD-10-CM

## 2016-06-24 DIAGNOSIS — D509 Iron deficiency anemia, unspecified: Secondary | ICD-10-CM

## 2016-06-24 LAB — POCT URINE PREGNANCY: Preg Test, Ur: NEGATIVE

## 2016-06-24 NOTE — Patient Instructions (Signed)
IUD Post-procedure Instructions . Cramping is common.  You may take Ibuprofen, Aleve, or Tylenol for the cramping.  This should resolve within 24 hours.   . You may have a small amount of spotting.  You should wear a mini pad for the next few days. . You may have intercourse in 24 hours. . You need to call the office if you have any pelvic pain, fever, heavy bleeding, or foul smelling vaginal discharge. . Shower or bathe as normal   Try magnesium 500 mg a day for constipation

## 2016-06-24 NOTE — Addendum Note (Signed)
Addended by: Dorothy Spark on: 06/24/2016 09:54 AM   Modules accepted: Orders

## 2016-06-24 NOTE — Progress Notes (Addendum)
GYNECOLOGY  VISIT   HPI: 45 y.o.   Single  African American  female   Pam Mckee with No LMP recorded.   here for Mirena IUD insertion for menorrhagia, anemia, and contraception. She is having trouble with bad constipation with oral iron, asking about IV iron.    GYNECOLOGIC HISTORY: No LMP recorded. Contraception:none  Menopausal hormone therapy: none         OB History    Gravida Para Term Preterm AB TAB SAB Ectopic Multiple Living   2 2 1 1  0 0 0 0 0 1         Patient Active Problem List   Diagnosis Date Noted  . Left hip pain 07/10/2015  . Lumbago 07/10/2015  . Hypothyroidism 06/06/2015  . 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 Obstructive sleep apnea 05/18/2015  . Breast calcifications on mammogram 05/18/2015  . Menorrhagia with regular cycle 05/18/2015  . Left knee pain 05/18/2015  . Severe obesity (BMI >= 40) ([place]) 03/15/2015  . Thyroid activity decreased 03/15/2015  . Vitamin D deficiency 03/15/2015  . Olecranon bursitis of right elbow 02/15/2015  . Essential hypertension 02/15/2015    Past Medical History  Diagnosis Date  . Seizures ([place])   . Thyroid disease     hypo  . Hypertension   . OSA on CPAP   . Anemia     No past surgical history on file.  Current Outpatient Prescriptions  Medication Sig Dispense Refill  . ergocalciferol (VITAMIN D2) 50000 UNITS capsule Take 1 capsule (50,000 Units total) by mouth once a week. 12 capsule 1  . hydrochlorothiazide (HYDRODIURIL) 25 MG tablet Take 1 tablet (25 mg total) by mouth daily. 90 tablet 3  . ibuprofen (ADVIL,MOTRIN) 800 MG tablet Take 1 tablet (800 mg total) by mouth every 8 (eight) hours as needed. 30 tablet 1  . iron polysaccharides (NIFEREX) 150 MG capsule Take 1 capsule (150 mg total) by mouth daily. 90 capsule 1  . levothyroxine (SYNTHROID, LEVOTHROID) 88 MCG tablet Take 112 mcg by mouth daily before breakfast.    . losartan (COZAAR) 100 MG tablet Take 1 tablet (100 mg total) by mouth daily. 90  tablet 3  . misoprostol (CYTOTEC) 200 MCG tablet Place 2 tablets vaginally 6-12 hours prior to procedure. 2 tablet 0  . ondansetron (ZOFRAN ODT) 4 MG disintegrating tablet Take 1 tablet (4 mg total) by mouth every 8 (eight) hours as needed for nausea or vomiting. 20 tablet 0   No current facility-administered medications for this visit.     ALLERGIES: Review of patient's allergies indicates no known allergies.  Family History  Problem Relation Age of Onset  . Hypertension Mother   . Hypertension Brother   . Aneurysm Brother   . Diabetes Paternal Grandfather   . Renal Disease Father     Social History   Social History  . Marital Status: Single    Spouse Name: N/A  . Number of Children: 1  . Years of Education: HS   Occupational History  . 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 Smoking status: Never Smoker   . Smokeless tobacco: Never Used  . Alcohol Use: No  . Drug Use: No  . Sexual Activity:    Partners: Male    Birth Control/ Protection: Condom   Other Topics Concern  . Not on file   Social History Narrative   Single.Exercise 3 times/week Zumba for 1 hour   Drinks about 2-3 cups of coffee/tea  a day. Education: [employer].    Review of Systems  Constitutional: Negative.   HENT: Negative.   Eyes: Negative.   Respiratory: Negative.   Cardiovascular: Negative.   Gastrointestinal: Negative.   Genitourinary: Negative.   Musculoskeletal: Negative.   Skin: Negative.   Neurological: Negative.   Endo/Heme/Allergies: Negative.   Psychiatric/Behavioral: Negative.     PHYSICAL EXAMINATION:    BP 138/78 mmHg  Pulse 78  Resp 14  Wt 233 lb (105.688 kg)  LMP     General appearance: alert, cooperative and appears stated age  Pelvic: External genitalia:  no lesions              Urethra:  normal appearing urethra with no masses, tenderness or lesions              Bartholins and Skenes: normal                 Vagina: normal appearing  vagina with normal color and discharge, no lesions              Cervix:no lesions  The risks of the mirena IUD were reviewed with the patient, including infection, abnormal bleeding and uterine perfortion. Consent was signed.  A speculum was placed in the vagina, the cervix was cleansed with betadine. A tenaculum was placed on the cervix, the uterus sounded to 7-8 cm. The cervix was dilated to a 5 hagar dilator  The mirena IUD was inserted without difficulty. The string were cut to 3-4 cm. The tenaculum was removed. Slight oozing from the tenaculum site was stopped with pressure.   The patient tolerated the procedure well.   Chaperone was present for exam.  ASSESSMENT Menorrhagia, anemia Contraception Trouble tolerating oral iron    PLAN Genprobe done Mirena IUD inserted F/U in 1 month Call with any concerns Consult with hematology about IV iron If advised her to try taking magnesium 500 mg a day, this should help with the constipation issues   An After Visit Summary was printed and given to the patient.   CC: Dr Brigitte Pulse

## 2016-06-25 LAB — HEMOGLOBINOPATHY EVALUATION
HCT: 35.2 % (ref 35.0–45.0)
Hemoglobin: 10.7 g/dL — ABNORMAL LOW (ref 11.7–15.5)
Hgb A2 Quant: 2.3 % (ref 1.8–3.5)
Hgb A: 96.7 % (ref >96.0)
Hgb F Quant: <1 % (ref <2.0)
MCH: 24.1 pg — ABNORMAL LOW (ref 27.0–33.0)
MCV: 79.3 fL — ABNORMAL LOW (ref 80.0–100.0)
RBC: 4.44 MIL/uL (ref 3.80–5.10)
RDW: 15.1 % — ABNORMAL HIGH (ref 11.0–15.0)

## 2016-06-25 LAB — GC/CHLAMYDIA PROBE AMP
CT Probe RNA: NOT DETECTED
GC Probe RNA: NOT DETECTED

## 2016-06-28 DIAGNOSIS — G4733 Obstructive sleep apnea (adult) (pediatric): Secondary | ICD-10-CM | POA: Diagnosis not present

## 2016-07-07 DIAGNOSIS — M5136 Other intervertebral disc degeneration, lumbar region: Secondary | ICD-10-CM | POA: Diagnosis not present

## 2016-07-07 DIAGNOSIS — Z6836 Body mass index (BMI) 36.0-36.9, adult: Secondary | ICD-10-CM | POA: Diagnosis not present

## 2016-07-16 ENCOUNTER — Ambulatory Visit: Payer: 59

## 2016-07-16 ENCOUNTER — Ambulatory Visit: Payer: 59 | Admitting: Hematology & Oncology

## 2016-07-16 ENCOUNTER — Other Ambulatory Visit: Payer: 59

## 2016-07-18 MED FILL — HYDROCHLOROTHIAZIDE 25 MG T: 25 | 90 days supply | Qty: 90 | Fill #0

## 2016-07-18 MED FILL — LEVOTHYROXINE 112 MCG TAB: 112 | 30 days supply | Qty: 30 | Fill #7

## 2016-07-18 MED FILL — LOSARTAN POTASSIUM 100 MG T: 100 | 90 days supply | Qty: 90 | Fill #1

## 2016-07-21 ENCOUNTER — Ambulatory Visit (INDEPENDENT_AMBULATORY_CARE_PROVIDER_SITE_OTHER): Payer: 59 | Admitting: Obstetrics and Gynecology

## 2016-07-21 ENCOUNTER — Encounter: Payer: Self-pay | Admitting: Obstetrics and Gynecology

## 2016-07-21 ENCOUNTER — Ambulatory Visit: Payer: 59 | Admitting: Obstetrics and Gynecology

## 2016-07-21 ENCOUNTER — Telehealth: Payer: Self-pay | Admitting: Obstetrics and Gynecology

## 2016-07-21 VITALS — BP 122/82 | HR 64 | Resp 14 | Wt 229.0 lb

## 2016-07-21 DIAGNOSIS — N920 Excessive and frequent menstruation with regular cycle: Secondary | ICD-10-CM | POA: Diagnosis not present

## 2016-07-21 DIAGNOSIS — Z30431 Encounter for routine checking of intrauterine contraceptive device: Secondary | ICD-10-CM

## 2016-07-21 DIAGNOSIS — D509 Iron deficiency anemia, unspecified: Secondary | ICD-10-CM

## 2016-07-21 LAB — CBC
HCT: 34.1 % — ABNORMAL LOW (ref 35.0–45.0)
Hemoglobin: 10.7 g/dL — ABNORMAL LOW (ref 11.7–15.5)
MCH: 23.4 pg — ABNORMAL LOW (ref 27.0–33.0)
MCHC: 31.4 g/dL — ABNORMAL LOW (ref 32.0–36.0)
MCV: 74.6 fL — ABNORMAL LOW (ref 80.0–100.0)
MPV: 10.4 fL (ref 7.5–12.5)
Platelets: 376 10*3/uL (ref 140–400)
RBC: 4.57 MIL/uL (ref 3.80–5.10)
RDW: 15.4 % — ABNORMAL HIGH (ref 11.0–15.0)
WBC: 8.4 10*3/uL (ref 3.8–10.8)

## 2016-07-21 LAB — FERRITIN: Ferritin: 20 ng/mL (ref 10–232)

## 2016-07-21 NOTE — Telephone Encounter (Signed)
Will close the encounter. Will make further plans depending on her blood work results.

## 2016-07-21 NOTE — Telephone Encounter (Signed)
Routing to Dr.Jertson as FYI. Okay to close encounter?

## 2016-07-21 NOTE — Progress Notes (Signed)
GYNECOLOGY  VISIT   HPI: 45 y.o.   Single  African American  female   [id number] with No LMP recorded.   here for Mirena  IUD check. She has a h/o a fibroid uterus, menorrhagia and anemia. She is here 1 month s/p IUD insertion for f/u. No bleeding since insertion. She has an appointment with hematology for possible iron transfusion. She has been doing better with the oral iron since she started magnesium.  Sexually active, no pain.   GYNECOLOGIC HISTORY: No LMP recorded. Contraception:IUD (Mirena) Menopausal hormone therapy: none         OB History    Gravida Para Term Preterm AB Living   2 2 1 1  0 1   SAB TAB Ectopic Multiple Live Births   0 0 0 0           Patient Active Problem List   Diagnosis Date Noted  . Left hip pain 07/10/2015  . Lumbago 07/10/2015  . Hypothyroidism 06/06/2015  . 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 Obstructive sleep apnea 05/18/2015  . Breast calcifications on mammogram 05/18/2015  . Menorrhagia with regular cycle 05/18/2015  . Left knee pain 05/18/2015  . Severe obesity (BMI >= 40) ([place]) 03/15/2015  . Thyroid activity decreased 03/15/2015  . Vitamin D deficiency 03/15/2015  . Olecranon bursitis of right elbow 02/15/2015  . Essential hypertension 02/15/2015    Past Medical History:  Diagnosis Date  . Anemia   . Hypertension   . OSA on CPAP   . Seizures ([place])   . Thyroid disease    hypo    Past Surgical History:  Procedure Laterality Date  . INTRAUTERINE DEVICE (IUD) INSERTION  06/2016    Current Outpatient Prescriptions  Medication Sig Dispense Refill  . ergocalciferol (VITAMIN D2) 50000 UNITS capsule Take 1 capsule (50,000 Units total) by mouth once a week. 12 capsule 1  . hydrochlorothiazide (HYDRODIURIL) 25 MG tablet Take 1 tablet (25 mg total) by mouth daily. 90 tablet 3  . iron polysaccharides (NIFEREX) 150 MG capsule Take 1 capsule (150 mg total) by mouth daily. 90 capsule 1  . levonorgestrel (MIRENA) 20 MCG/24HR IUD 1  each by Intrauterine route once.    Marland Kitchen levothyroxine (SYNTHROID, LEVOTHROID) 88 MCG tablet Take 112 mcg by mouth daily before breakfast.    . losartan (COZAAR) 100 MG tablet Take 1 tablet (100 mg total) by mouth daily. 90 tablet 3   No current facility-administered medications for this visit.      ALLERGIES: Review of patient's allergies indicates no known allergies.  Family History  Problem Relation Age of Onset  . Hypertension Mother   . Hypertension Brother   . Aneurysm Brother   . Diabetes Paternal Grandfather   . Renal Disease Father     Social History   Social History  . Marital status: Single    Spouse name: N/A  . Number of children: 1  . 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 Smoking status: Never Smoker  . Smokeless tobacco: Never Used  . Alcohol use No  . Drug use: No  . Sexual activity: Yes    Partners: Male    Birth control/ protection: Condom   Other Topics Concern  . Not on file   Social History Narrative   Single.Exercise 3 times/week Zumba for 1 hour   Drinks about 2-3 cups of coffee/tea a day. Education: [employer].  Review of Systems  Constitutional: Negative.   HENT: Negative.   Eyes: Negative.   Respiratory: Negative.   Cardiovascular: Negative.   Gastrointestinal: Negative.   Genitourinary: Negative.   Musculoskeletal: Negative.   Skin: Negative.   Neurological: Negative.   Endo/Heme/Allergies: Negative.   Psychiatric/Behavioral: Negative.     PHYSICAL EXAMINATION:    BP 122/82 (BP Location: Right Arm, Patient Position: Sitting, Cuff Size: Large)   Pulse 64   Resp 14   Wt 229 lb (103.9 kg)   BMI 36.96 kg/m     General appearance: alert, cooperative and appears stated age  Pelvic: External genitalia:  no lesions              Urethra:  normal appearing urethra with no masses, tenderness or lesions              Bartholins and Skenes: normal                  Vagina: normal appearing vagina with normal color and discharge, no lesions              Cervix: no lesions and IUD string 3-4 cm              Bimanual Exam:  Uterus:  slightly enlarged, RV, not tender, mobile.               Adnexa: no mass, fullness, tenderness               Chaperone was present for exam.  ASSESSMENT IUD check, doing well H/O menorrhagia, so far improved, no cycle since IUD placed Anemia    PLAN CBC, Ferritin Continue iron and magnesium Will decided on need for hematology appointment depending on lab work Calendar bleeding, f/u in 3 months   An After Visit Summary was printed and given to the patient.

## 2016-07-21 NOTE — Telephone Encounter (Signed)
Orson Slick' with Dr Antonieta Pert office called statig patient cancelled her appointment. Cancelled citing appointments and new testing at our office. She will schedule pending new results.

## 2016-07-23 ENCOUNTER — Ambulatory Visit: Payer: 59 | Admitting: Hematology & Oncology

## 2016-07-23 ENCOUNTER — Ambulatory Visit: Payer: 59

## 2016-07-23 ENCOUNTER — Other Ambulatory Visit: Payer: 59

## 2016-08-05 ENCOUNTER — Other Ambulatory Visit: Payer: Self-pay | Admitting: Endocrinology

## 2016-08-05 DIAGNOSIS — E041 Nontoxic single thyroid nodule: Secondary | ICD-10-CM

## 2016-08-18 DIAGNOSIS — M5136 Other intervertebral disc degeneration, lumbar region: Secondary | ICD-10-CM | POA: Diagnosis not present

## 2016-08-18 DIAGNOSIS — M5416 Radiculopathy, lumbar region: Secondary | ICD-10-CM | POA: Diagnosis not present

## 2016-08-19 MED FILL — LEVOTHYROXINE 112 MCG TAB: 112 | 30 days supply | Qty: 30 | Fill #8

## 2016-09-02 ENCOUNTER — Other Ambulatory Visit: Payer: Self-pay

## 2016-09-02 NOTE — Telephone Encounter (Signed)
Dr Brigitte Pulse, you have Rxd this for pt before for a year, and have seen pt in May and June for chronic issues, but don't see this discussed recently. OK to give RFs?

## 2016-09-03 MED ORDER — POLYETHYLENE GLYCOL 3350 17 GM/SCOOP PO POWD: 17.0000 g | Freq: Two times a day (BID) | ORAL | 8 refills | Status: DC | PRN

## 2016-09-03 MED FILL — POLYETHYLENE GLYCOL 3350 PO: 30 days supply | Qty: 1054 | Fill #0

## 2016-09-03 NOTE — Telephone Encounter (Signed)
Fine to refill miralax prn

## 2016-09-07 IMAGING — MR MR KNEE*L* WO/W CM
5 of 9 series · 19 of 40 positions shown · IV contrast (multihance)
Comparison: Plain films left knee 02/08/2016.

CLINICAL DATA: Left knee pain for approximately 2 weeks. Possible
periosteal reaction by prior plain films. Initial encounter.

EXAM:
MRI OF THE LEFT KNEE WITHOUT AND WITH CONTRAST
TECHNIQUE: Multiplanar, multisequence MR imaging was performed both before and
after administration of intravenous contrast.
CONTRAST:  20 ml MULTIHANCE GADOBENATE DIMEGLUMINE 529 MG/ML IV SOLN

[Series 2: PD fat-sat · axial · 4.0mm · 0.29mm/px · z∈[-49,+91]mm · 6 of 29 slices shown (1 of 3)]
[im 1/29]
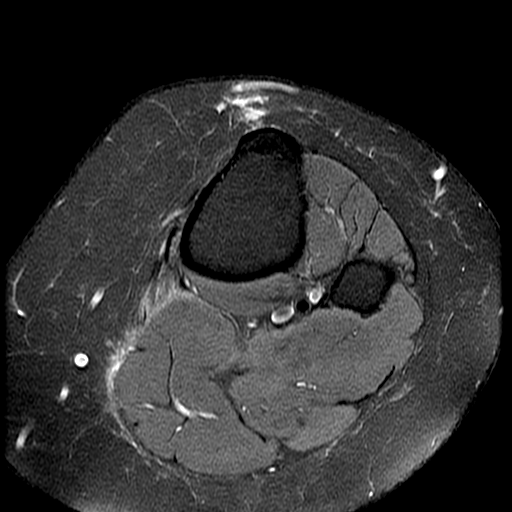
[im 6/29]
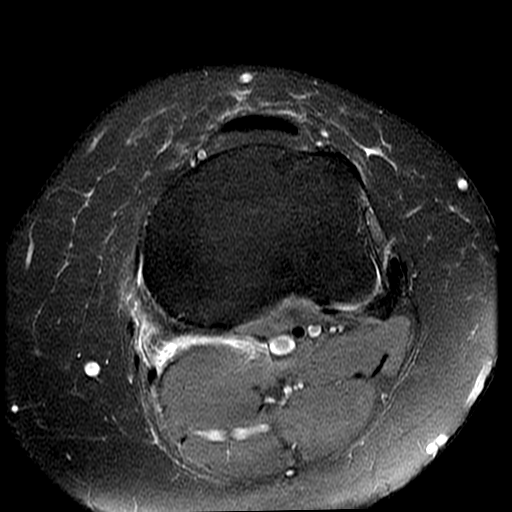
[im 12/29]
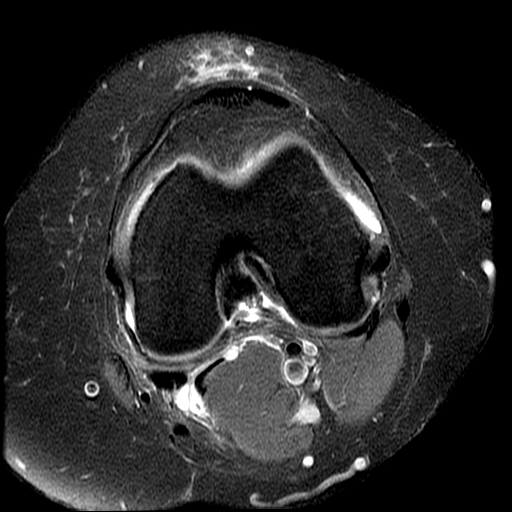
[im 17/29]
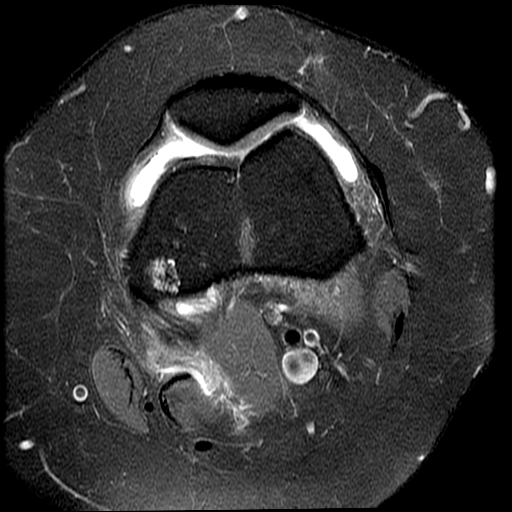
[im 23/29]
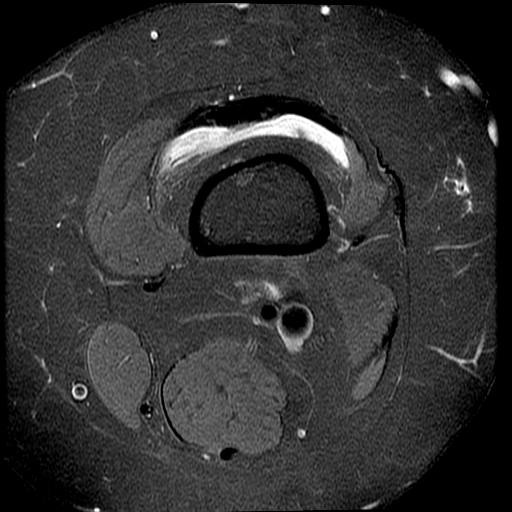
[im 29/29]
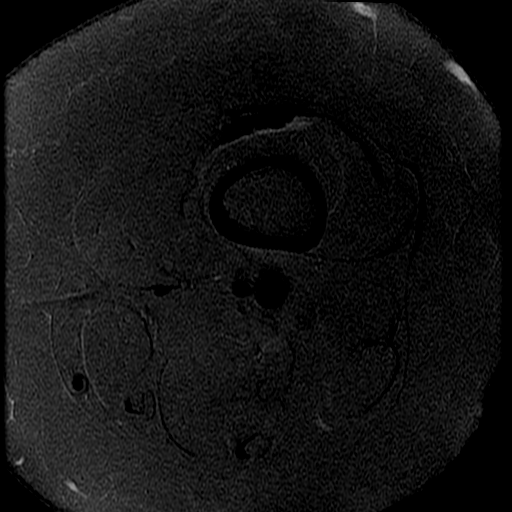

[Series 4: T1 fat-sat · axial · 4.0mm · 0.29mm/px · 1 of 29 slices shown]
[im 1/29]
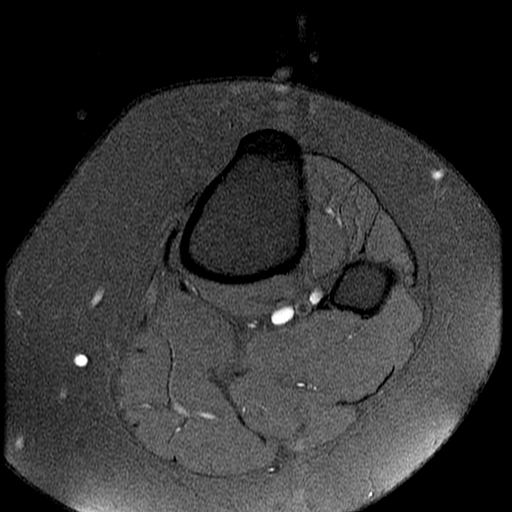

[Series 5: PD fat-sat · coronal · 4.0mm · 0.31mm/px · 4 of 22 slices shown (2 of 3)]
[im 1/22]
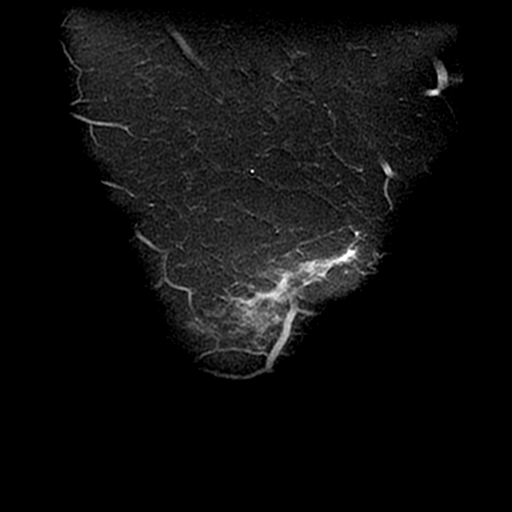
[im 8/22]
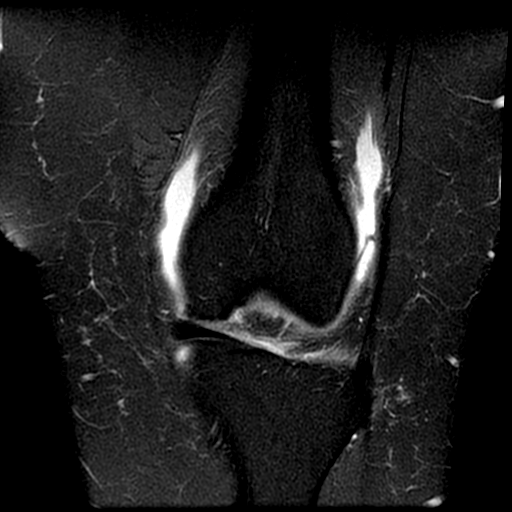
[im 15/22]
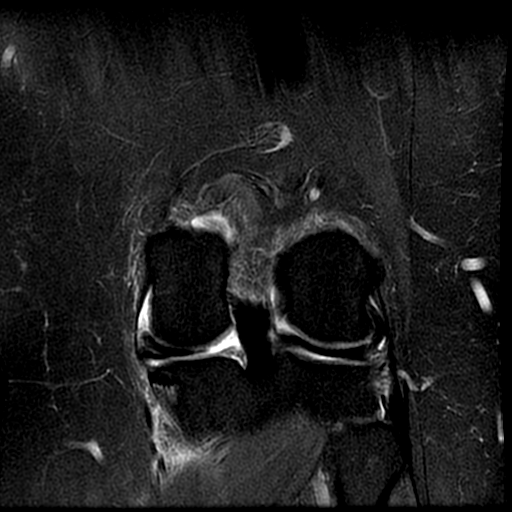
[im 22/22]
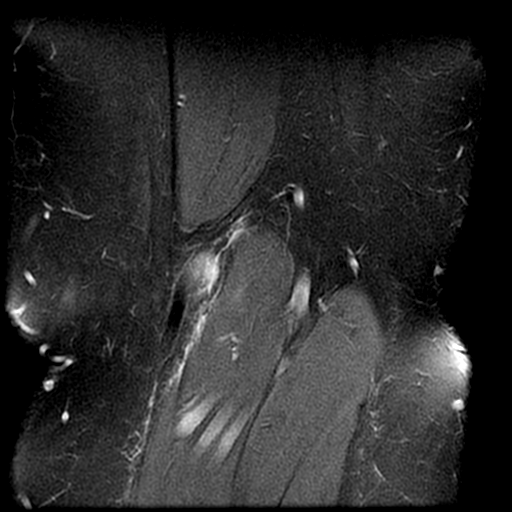

[Series 6: T2 fat-sat · coronal · 4.0mm · 0.31mm/px · 4 of 22 slices shown]
[im 1/22]
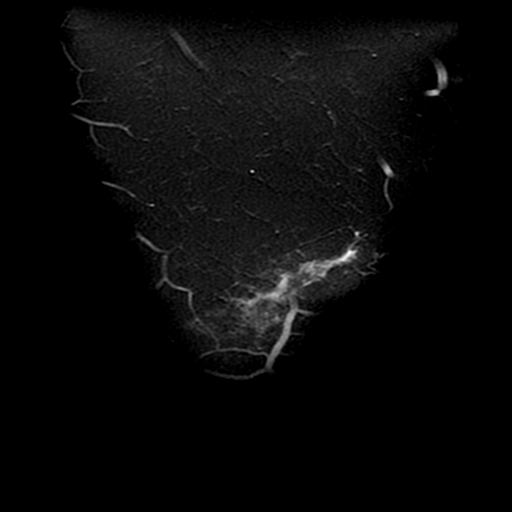
[im 8/22]
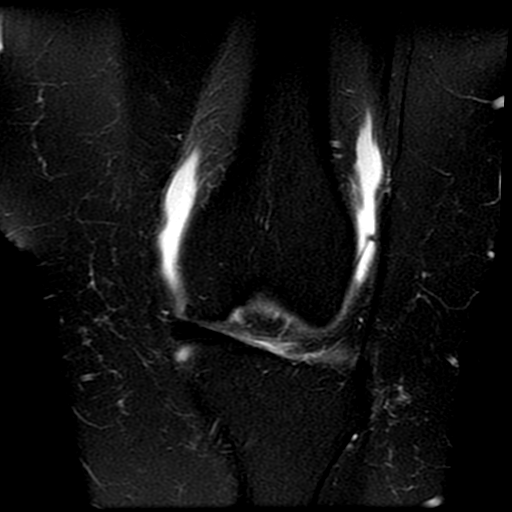
[im 15/22]
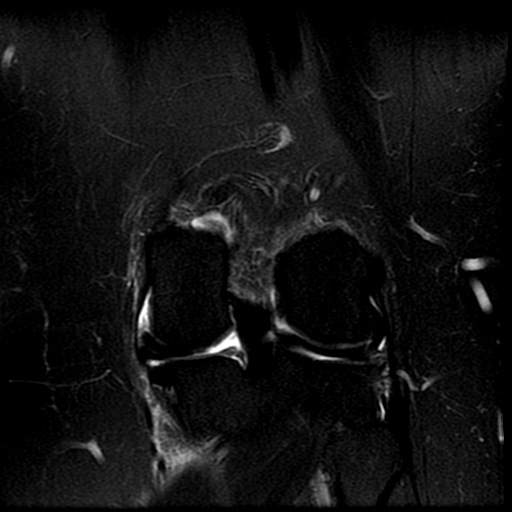
[im 22/22]
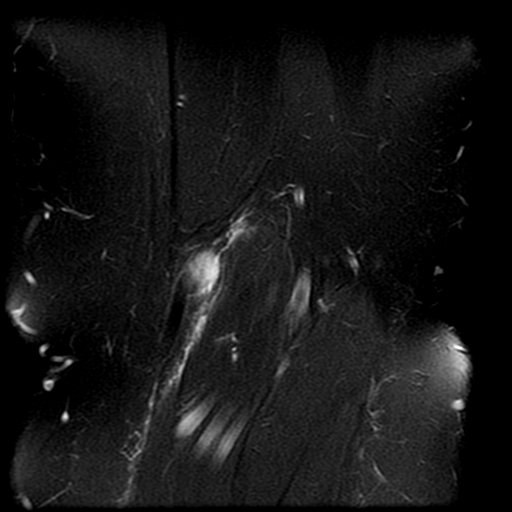

[Series 8: PD fat-sat · sagittal · 4.0mm · 0.31mm/px · 4 of 22 slices shown (3 of 3)]
[im 1/22]
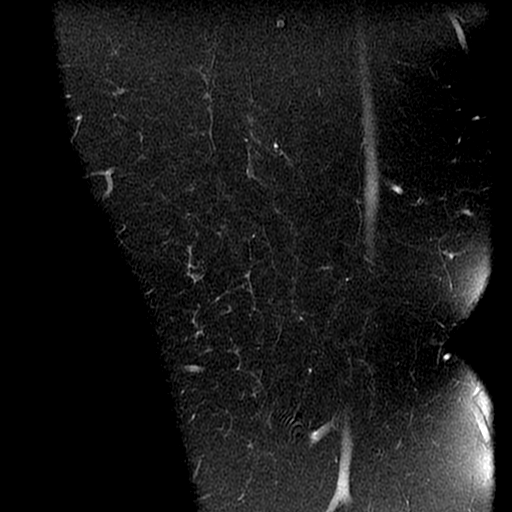
[im 8/22]
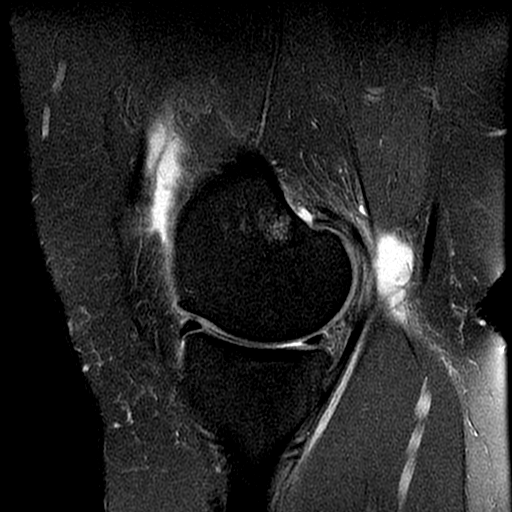
[im 15/22]
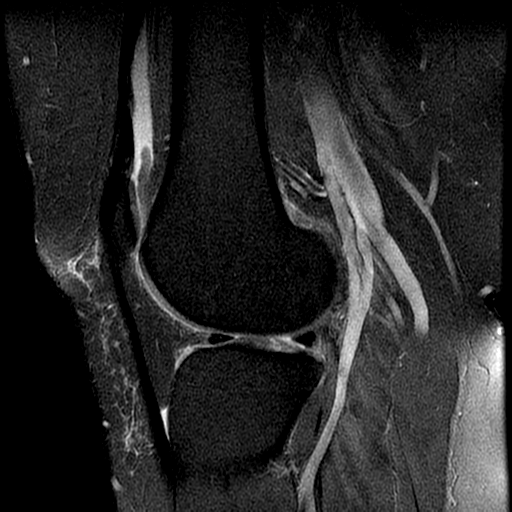
[im 22/22]
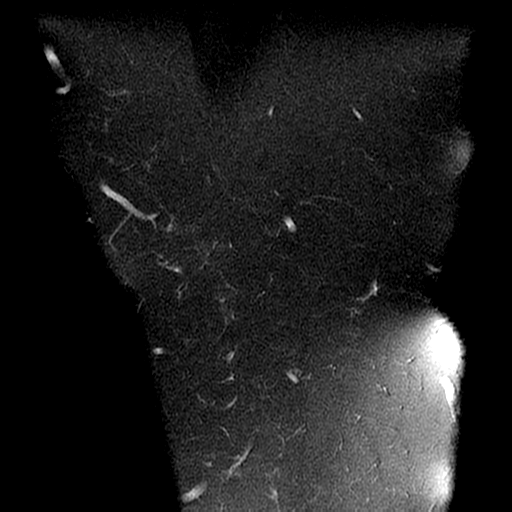

[19 of 40 positions shown; findings below may reference images not displayed]

FINDINGS: MENISCI

Medial meniscus: There is a complete radial tear through the root of
the posterior horn of the medial meniscus. The body of the medial
meniscus is extruded peripherally.

Lateral meniscus:  Intact.

LIGAMENTS

Cruciates:  Intact.

Collaterals:  Intact.

CARTILAGE

Patellofemoral:  Unremarkable.

Medial:  Mild degenerated.

Lateral:  Unremarkable.

Joint:  Small joint effusion.

Popliteal Fossa:  Baker's cyst measures 1.0 x 1.5 x 4.0 cm.

Extensor Mechanism:  Intact.

Bones: There is no periosteal reaction or worrisome lesion about the
knee. An enchondroma in the medial epicondyle of the femur measures
1.8 x 1.1 x 1.1 cm.
IMPRESSION: Negative for periosteal reaction or worrisome marrow lesion. Small
enchondroma medial epicondyle of the femur noted.

Complete radial tear root of the posterior horn of the medial
meniscus.

Small Baker's cyst.

## 2016-09-09 ENCOUNTER — Ambulatory Visit (INDEPENDENT_AMBULATORY_CARE_PROVIDER_SITE_OTHER): Payer: 59 | Admitting: Family Medicine

## 2016-09-09 VITALS — BP 124/82 | HR 76 | Temp 98.5°F | Resp 17 | Ht 66.0 in | Wt 228.0 lb

## 2016-09-09 DIAGNOSIS — I10 Essential (primary) hypertension: Secondary | ICD-10-CM | POA: Diagnosis not present

## 2016-09-09 DIAGNOSIS — E559 Vitamin D deficiency, unspecified: Secondary | ICD-10-CM | POA: Diagnosis not present

## 2016-09-09 DIAGNOSIS — E538 Deficiency of other specified B group vitamins: Secondary | ICD-10-CM | POA: Diagnosis not present

## 2016-09-09 DIAGNOSIS — D500 Iron deficiency anemia secondary to blood loss (chronic): Secondary | ICD-10-CM

## 2016-09-09 DIAGNOSIS — Z5181 Encounter for therapeutic drug level monitoring: Secondary | ICD-10-CM

## 2016-09-09 DIAGNOSIS — Z136 Encounter for screening for cardiovascular disorders: Secondary | ICD-10-CM

## 2016-09-09 DIAGNOSIS — E041 Nontoxic single thyroid nodule: Secondary | ICD-10-CM

## 2016-09-09 DIAGNOSIS — J209 Acute bronchitis, unspecified: Secondary | ICD-10-CM | POA: Diagnosis not present

## 2016-09-09 LAB — CBC
HCT: 34.9 % — ABNORMAL LOW (ref 35.0–45.0)
Hemoglobin: 10.9 g/dL — ABNORMAL LOW (ref 11.7–15.5)
MCH: 23.6 pg — ABNORMAL LOW (ref 27.0–33.0)
MCHC: 31.2 g/dL — ABNORMAL LOW (ref 32.0–36.0)
MCV: 75.5 fL — ABNORMAL LOW (ref 80.0–100.0)
MPV: 10.2 fL (ref 7.5–12.5)
Platelets: 328 10*3/uL (ref 140–400)
RBC: 4.62 MIL/uL (ref 3.80–5.10)
RDW: 15.7 % — ABNORMAL HIGH (ref 11.0–15.0)
WBC: 6.5 10*3/uL (ref 3.8–10.8)

## 2016-09-09 LAB — BASIC METABOLIC PANEL
BUN: 17 mg/dL (ref 7–25)
CO2: 23 mmol/L (ref 20–31)
Calcium: 9 mg/dL (ref 8.6–10.2)
Chloride: 103 mmol/L (ref 98–110)
Creat: 0.84 mg/dL (ref 0.50–1.10)
Glucose, Bld: 86 mg/dL (ref 65–99)
Potassium: 4 mmol/L (ref 3.5–5.3)
Sodium: 139 mmol/L (ref 135–146)

## 2016-09-09 LAB — LIPID PANEL
Cholesterol: 164 mg/dL (ref 125–200)
HDL: 38 mg/dL — ABNORMAL LOW (ref >=46)
LDL Cholesterol: 116 mg/dL (ref <130)
Total CHOL/HDL Ratio: 4.3 Ratio (ref <=5.0)
Triglycerides: 49 mg/dL (ref <150)
VLDL: 10 mg/dL (ref <30)

## 2016-09-09 LAB — FERRITIN: Ferritin: 26 ng/mL (ref 10–232)

## 2016-09-09 MED ORDER — PROMETHAZINE-CODEINE 6.25-10 MG/5ML PO SYRP: 5.0000 mL | ORAL_SOLUTION | Freq: Four times a day (QID) | ORAL | 0 refills | Status: DC | PRN

## 2016-09-09 MED ORDER — CYANOCOBALAMIN 1000 MCG/ML IJ SOLN
1000.0000 ug | INTRAMUSCULAR | Status: DC
  Administered 2016-09-09: 1000 ug via INTRAMUSCULAR

## 2016-09-09 MED ORDER — DEXTROMETHORPHAN POLISTIREX ER 30 MG/5ML PO SUER: 60.0000 mg | Freq: Two times a day (BID) | ORAL | 0 refills | Status: DC

## 2016-09-09 MED FILL — PROMETHAZINE-CODEINE SYRUP: 6.25-10 | 9 days supply | Qty: 180 | Fill #0

## 2016-09-09 NOTE — Patient Instructions (Addendum)
Start a vitamin D supplement of 2000u a day. I will let you know what you need to do with your iron supplement.  If you feel better over the next month- more energy, then start a daily vitamin b12 or B complex supplement.  Have the pharmacy send a refill request on your blood pressure medicine whenever it is needed.   IF you received an x-ray today, you will receive an invoice from [facility]. Please contact [facility] at [phone] with questions or concerns regarding your invoice.   IF you received labwork today, you will receive an invoice from [employer]. Please contact [facility] at [phone] with questions or concerns regarding your invoice.   Our billing staff will not be able to assist you with questions regarding bills from these companies.  You will be contacted with the lab results as soon as they are available. The fastest way to get your results is to activate your My Chart account. Instructions are located on the last page of this paperwork. If you have not heard from [place] regarding the results in 2 weeks, please contact this office.      Acute Bronchitis Bronchitis is inflammation of the airways that extend from the windpipe into the lungs (bronchi). The inflammation often causes mucus to develop. This leads to a cough, which is the most common symptom of bronchitis.  In acute bronchitis, the condition usually develops suddenly and goes away over time, usually in a couple weeks. Smoking, allergies, and asthma can make bronchitis worse. Repeated episodes of bronchitis may cause further lung problems.  CAUSES Acute bronchitis is most often caused by the same virus that causes a cold. The virus can spread from person to person (contagious) through coughing, sneezing, and touching contaminated objects. SIGNS AND SYMPTOMS   Cough.   Fever.   Coughing up mucus.   Body aches.   Chest congestion.   Chills.   Shortness of  breath.   Sore throat.  DIAGNOSIS  Acute bronchitis is usually diagnosed through a physical exam. Your health care provider will also ask you questions about your medical history. Tests, such as chest X-rays, are sometimes done to rule out other conditions.  TREATMENT  Acute bronchitis usually goes away in a couple weeks. Oftentimes, no medical treatment is necessary. Medicines are sometimes given for relief of fever or cough. Antibiotic medicines are usually not needed but may be prescribed in certain situations. In some cases, an inhaler may be recommended to help reduce shortness of breath and control the cough. A cool mist vaporizer may also be used to help thin bronchial secretions and make it easier to clear the chest.  HOME CARE INSTRUCTIONS  Get plenty of rest.   Drink enough fluids to keep your urine clear or pale yellow (unless you have a medical condition that requires fluid restriction). Increasing fluids may help thin your respiratory secretions (sputum) and reduce chest congestion, and it will prevent dehydration.   Take medicines only as directed by your health care provider.  If you were prescribed an antibiotic medicine, finish it all even if you start to feel better.  Avoid smoking and secondhand smoke. Exposure to cigarette smoke or irritating chemicals will make bronchitis worse. If you are a smoker, consider using nicotine gum or skin patches to help control withdrawal symptoms. Quitting smoking will help your lungs heal faster.   Reduce the chances of another bout of acute bronchitis by washing your hands frequently, avoiding people with cold symptoms,  and trying not to touch your hands to your mouth, nose, or eyes.   Keep all follow-up visits as directed by your health care provider.  SEEK MEDICAL CARE IF: Your symptoms do not improve after 1 week of treatment.  SEEK IMMEDIATE MEDICAL CARE IF:  You develop an increased fever or chills.   You have chest pain.    You have severe shortness of breath.  You have bloody sputum.   You develop dehydration.  You faint or repeatedly feel like you are going to pass out.  You develop repeated vomiting.  You develop a severe headache. MAKE SURE YOU:   Understand these instructions.  Will watch your condition.  Will get help right away if you are not doing well or get worse.   This information is not intended to replace advice given to you by your health care provider. Make sure you discuss any questions you have with your health care provider.   Document Released: 01/01/2005 Document Revised: 12/15/2014 Document Reviewed: 05/17/2013 Elsevier Interactive Patient Education [employer].

## 2016-09-09 NOTE — Progress Notes (Signed)
By signing my name below, I, Mesha Guinyard, attest that this documentation has been prepared under the direction and in the presence of [name], MD.  Electronically Signed: Verlee Monte, Medical Scribe. 09/09/16. 8:16 AM.  Subjective:    Patient ID: Pam Mckee, female    DOB: 28-Jan-1971, 45 y.o.   MRN: [id number]  HPI Chief Complaint  Patient presents with  . Cough    x1week. Non productive.   Worthy Keeler    Pt requesting lipid panel    HPI Comments: Pam Mckee is a 45 y.o. female who presents to the [facility] complaining of dry "hacking" cough onset 1 week. Pt reports associated symptoms of HA, sinus pressure, and sore throat. Pt has been using [place] cough drops for relief to her symptoms. Pt reports 2 sick contacts at work. Pt states she is always cold when asked if she has chills, but no new chills felt. Pt denies fever, chills, SOB, chest tightness.  Labs: Pt is taking iron 2-3 days out of the week. Pt would like to get her lipids checked for her wellness chart.  Back: Pt mentions her back is doing fine. Pt received injections and she goes back on the 9th to get the 2nd injections.  Thyroid: Pt has an ultra sound scheduled in Nov to check her thyroid and an appt Nov 30th with a physician for consult.  Patient Active Problem List   Diagnosis Date Noted  . Left hip pain 07/10/2015  . Lumbago 07/10/2015  . Hypothyroidism 06/06/2015  . 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 Obstructive sleep apnea 05/18/2015  . Breast calcifications on mammogram 05/18/2015  . Menorrhagia with regular cycle 05/18/2015  . Left knee pain 05/18/2015  . Severe obesity (BMI >= 40) ([place]) 03/15/2015  . Thyroid activity decreased 03/15/2015  . Vitamin D deficiency 03/15/2015  . Olecranon bursitis of right elbow 02/15/2015  . Essential hypertension 02/15/2015   Past Medical History:  Diagnosis Date  . Anemia   . Hypertension   . OSA on CPAP   . Seizures ([place])     . Thyroid disease    hypo   Past Surgical History:  Procedure Laterality Date  . INTRAUTERINE DEVICE (IUD) INSERTION  06/2016   No Known Allergies Prior to Admission medications   Medication Sig Start Date End Date Taking? Authorizing Provider  ergocalciferol (VITAMIN D2) 50000 UNITS capsule Take 1 capsule (50,000 Units total) by mouth once a week. 10/28/15  Yes Shawnee Knapp, MD  hydrochlorothiazide (HYDRODIURIL) 25 MG tablet Take 1 tablet (25 mg total) by mouth daily. 01/24/16  Yes Shawnee Knapp, MD  iron polysaccharides (NIFEREX) 150 MG capsule Take 1 capsule (150 mg total) by mouth daily. 10/28/15  Yes Shawnee Knapp, MD  levonorgestrel (MIRENA) 20 MCG/24HR IUD 1 each by Intrauterine route once.   Yes Historical Provider, MD  levothyroxine (SYNTHROID, LEVOTHROID) 88 MCG tablet Take 112 mcg by mouth daily before breakfast.   Yes Historical Provider, MD  losartan (COZAAR) 100 MG tablet Take 1 tablet (100 mg total) by mouth daily. 01/24/16  Yes Shawnee Knapp, MD  polyethylene glycol powder (GLYCOLAX/MIRALAX) powder Take 17 g by mouth 2 (two) times daily as needed for mild constipation. 09/03/16  Yes Shawnee Knapp, MD   Social History   Social History  . Marital status: Single    Spouse name: N/A  . Number of children: 1  . 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 Smoking status: Never Smoker  . Smokeless tobacco: Never Used  . Alcohol use No  . Drug use: No  . Sexual activity: Yes    Partners: Male    Birth control/ protection: Condom   Other Topics Concern  . Not on file   Social History Narrative   Single.Exercise 3 times/week Zumba for 1 hour   Drinks about 2-3 cups of coffee/tea a day. Education: [employer].   Depression screen [facility] 2/9 09/09/2016 05/17/2016 04/24/2016 02/08/2016 01/24/2016  Decreased Interest 0 0 0 0 0  Down, Depressed, Hopeless 0 0 1 0 0  PHQ - 2 Score 0 0 1 0 0   Review of Systems   Constitutional: Negative for activity change, appetite change, chills, diaphoresis and fever.  HENT: Positive for congestion, postnasal drip, rhinorrhea, sinus pressure and sore throat. Negative for ear pain, facial swelling and nosebleeds.   Respiratory: Positive for cough. Negative for chest tightness and shortness of breath.   Musculoskeletal: Negative for arthralgias, back pain and gait problem.  Neurological: Positive for headaches.   Objective:  Physical Exam  Constitutional: She appears well-developed and well-nourished. No distress.  HENT:  Head: Normocephalic and atraumatic.  Right Ear: Tympanic membrane normal.  Left Ear: Tympanic membrane normal.  Mouth/Throat: Posterior oropharyngeal erythema (little) present.  Nares nl  Eyes: Conjunctivae are normal.  Neck: Neck supple. Thyromegaly present.  Cardiovascular: Normal rate, regular rhythm, S1 normal, S2 normal and normal heart sounds.  Exam reveals no friction rub.   No murmur heard. Pulmonary/Chest: Effort normal and breath sounds normal. No respiratory distress. She has no wheezes. She has no rales.  Lymphadenopathy:    She has no cervical adenopathy.  Neurological: She is alert.  Skin: Skin is warm and dry.  Psychiatric: She has a normal mood and affect. Her behavior is normal.  Nursing note and vitals reviewed.  BP 124/82 (BP Location: Left Arm, Patient Position: Sitting, Cuff Size: Large)   Pulse 76   Temp 98.5 F (36.9 C) (Oral)   Resp 17   Ht 5\' 6"  (1.676 m)   Wt 228 lb (103.4 kg)   SpO2 100%   BMI 36.80 kg/m  Assessment & Plan:   1. Acute bronchitis, unspecified organism - suspect viral, treat symptomatically  2. Iron deficiency anemia due to chronic blood loss - now amenorrheic with an IUD for 3 mos (07/18), taking iron about 2x/wks, wants to know if she should cont  3. Essential hypertension - well controlled on current regimen. Refill meds x 6 mos whenever needed  4. Screening for cardiovascular  condition   5. Nontoxic single thyroid nodule - follows with Dr. Chalmers Cater - has an appt for an [place] soon  6. Vitamin D deficiency - recheck as completed high dose rx supp  7. Vitamin B12 deficiency - has not supplemented, will try IM inj today to see if it helps energy level over the next mo - if it does, recommend pt start taking daily oral supp  8. Medication monitoring encounter     Orders Placed This Encounter  Procedures  . Lipid panel    Order Specific Question:   Has the patient fasted?    Answer:   Yes  . CBC  . Ferritin  . VITAMIN D 25 Hydroxy (Vit-D Deficiency, Fractures)  . Basic metabolic panel    Order Specific Question:   Has the patient fasted?  Answer:   No  . Care order/instruction:    AVS printed - let patient go!    Meds ordered this encounter  Medications  . cyanocobalamin ((VITAMIN B-12)) injection 1,000 mcg  . dextromethorphan (DELSYM) 30 MG/5ML liquid    Sig: Take 10 mLs (60 mg total) by mouth 2 (two) times daily.    Dispense:  148 mL    Refill:  0  . DISCONTD: promethazine-codeine (PHENERGAN WITH CODEINE) 6.25-10 MG/5ML syrup    Sig: Take 5-10 mLs by mouth every 6 (six) hours as needed for cough.    Dispense:  180 mL    Refill:  0  . promethazine-codeine (PHENERGAN WITH CODEINE) 6.25-10 MG/5ML syrup    Sig: Take 5-10 mLs by mouth every 6 (six) hours as needed for cough.    Dispense:  180 mL    Refill:  0    I personally performed the services described in this documentation, which was scribed in my presence. The recorded information has been reviewed and considered, and addended by me as needed.   [name], M.D.  [facility] [address] [place], [place] [address] [phone] phone [phone] fax  09/09/16 4:33 PM

## 2016-09-10 LAB — VITAMIN D 25 HYDROXY (VIT D DEFICIENCY, FRACTURES): Vit D, 25-Hydroxy: 28 ng/mL — ABNORMAL LOW (ref 30–100)

## 2016-09-17 ENCOUNTER — Other Ambulatory Visit: Payer: Self-pay | Admitting: Family Medicine

## 2016-09-17 DIAGNOSIS — Z1231 Encounter for screening mammogram for malignant neoplasm of breast: Secondary | ICD-10-CM

## 2016-09-22 MED FILL — LEVOTHYROXINE 112 MCG TAB: 112 | 30 days supply | Qty: 30 | Fill #9

## 2016-10-03 ENCOUNTER — Ambulatory Visit
Admission: RE | Admit: 2016-10-03 | Discharge: 2016-10-03 | Disposition: A | Discharge status: Home / Self Care | Payer: 59 | Source: Ambulatory Visit | Attending: Family Medicine | Admitting: Family Medicine

## 2016-10-03 DIAGNOSIS — Z1231 Encounter for screening mammogram for malignant neoplasm of breast: Secondary | ICD-10-CM | POA: Diagnosis not present

## 2016-10-07 MED FILL — IBUPROFEN 800 MG TABLET: 800 | 10 days supply | Qty: 30 | Fill #1

## 2016-10-07 MED FILL — LOSARTAN POTASSIUM 100 MG T: 100 | 30 days supply | Qty: 30 | Fill #2

## 2016-10-07 MED FILL — POLYETHYLENE GLYCOL 3350 PO: 30 days supply | Qty: 1054 | Fill #1

## 2016-10-07 MED FILL — HYDROCHLOROTHIAZIDE 25 MG T: 25 | 30 days supply | Qty: 30 | Fill #1

## 2016-10-28 ENCOUNTER — Encounter: Payer: Self-pay | Admitting: Obstetrics and Gynecology

## 2016-10-28 DIAGNOSIS — E039 Hypothyroidism, unspecified: Secondary | ICD-10-CM | POA: Diagnosis not present

## 2016-10-29 ENCOUNTER — Ambulatory Visit
Admission: RE | Admit: 2016-10-29 | Discharge: 2016-10-29 | Disposition: A | Discharge status: Home / Self Care | Payer: 59 | Source: Ambulatory Visit | Attending: Endocrinology | Admitting: Endocrinology

## 2016-10-29 DIAGNOSIS — E041 Nontoxic single thyroid nodule: Secondary | ICD-10-CM

## 2016-10-31 MED FILL — HYDROCHLOROTHIAZIDE 25 MG T: 25 | 30 days supply | Qty: 30 | Fill #2

## 2016-10-31 MED FILL — LOSARTAN POTASSIUM 100 MG T: 100 | 30 days supply | Qty: 30 | Fill #3

## 2016-11-03 MED FILL — POLYETHYLENE GLYCOL 3350 PO: 30 days supply | Qty: 1054 | Fill #2

## 2016-11-06 ENCOUNTER — Encounter: Payer: Self-pay | Admitting: Family Medicine

## 2016-11-06 DIAGNOSIS — E041 Nontoxic single thyroid nodule: Secondary | ICD-10-CM | POA: Diagnosis not present

## 2016-11-06 DIAGNOSIS — I10 Essential (primary) hypertension: Secondary | ICD-10-CM | POA: Diagnosis not present

## 2016-11-06 DIAGNOSIS — E039 Hypothyroidism, unspecified: Secondary | ICD-10-CM | POA: Diagnosis not present

## 2016-11-06 MED FILL — LEVOTHYROXINE 112 MCG TAB: 112 | 30 days supply | Qty: 30 | Fill #10

## 2016-11-19 ENCOUNTER — Ambulatory Visit (INDEPENDENT_AMBULATORY_CARE_PROVIDER_SITE_OTHER): Payer: 59 | Admitting: Obstetrics and Gynecology

## 2016-11-19 ENCOUNTER — Encounter: Payer: Self-pay | Admitting: Obstetrics and Gynecology

## 2016-11-19 VITALS — BP 120/82 | HR 84 | Resp 16 | Ht 67.0 in | Wt 230.0 lb

## 2016-11-19 DIAGNOSIS — Z862 Personal history of diseases of the blood and blood-forming organs and certain disorders involving the immune mechanism: Secondary | ICD-10-CM

## 2016-11-19 DIAGNOSIS — D259 Leiomyoma of uterus, unspecified: Secondary | ICD-10-CM | POA: Diagnosis not present

## 2016-11-19 DIAGNOSIS — Z30431 Encounter for routine checking of intrauterine contraceptive device: Secondary | ICD-10-CM | POA: Diagnosis not present

## 2016-11-19 DIAGNOSIS — E559 Vitamin D deficiency, unspecified: Secondary | ICD-10-CM | POA: Diagnosis not present

## 2016-11-19 LAB — CBC
HCT: 34.8 % — ABNORMAL LOW (ref 35.0–45.0)
Hemoglobin: 10.9 g/dL — ABNORMAL LOW (ref 11.7–15.5)
MCH: 24.4 pg — ABNORMAL LOW (ref 27.0–33.0)
MCHC: 31.3 g/dL — ABNORMAL LOW (ref 32.0–36.0)
MCV: 77.9 fL — ABNORMAL LOW (ref 80.0–100.0)
MPV: 10.3 fL (ref 7.5–12.5)
Platelets: 341 10*3/uL (ref 140–400)
RBC: 4.47 MIL/uL (ref 3.80–5.10)
RDW: 14.7 % (ref 11.0–15.0)
WBC: 7.6 10*3/uL (ref 3.8–10.8)

## 2016-11-19 NOTE — Progress Notes (Signed)
GYNECOLOGY  VISIT   HPI: 45 y.o.   Single  African American  female   [id number] with No LMP recorded. Patient is not currently having periods (Reason: IUD).   here for   Follow-up. The patient has a h/o a fibroid uterus, menorrhagia and anemia. She had a mirena IUD placed in 7/17. She hasn't had a real cycle since then, just occasional spotting. Sexually active, no pain, spots after intercourse (just since the IUD was placed).  She is still taking iron, but not every day. She also has a h/o a vit d def and hasn't been on vit d for 2-3 months.   GYNECOLOGIC HISTORY: No LMP recorded. Patient is not currently having periods (Reason: IUD). Contraception:IUD Menopausal hormone therapy: none        OB History    Gravida Para Term Preterm AB Living   2 2 1 1  0 1   SAB TAB Ectopic Multiple Live Births   0 0 0 0           Patient Active Problem List   Diagnosis Date Noted  . Left hip pain 07/10/2015  . Lumbago 07/10/2015  . Hypothyroidism 06/06/2015  . 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 Obstructive sleep apnea 05/18/2015  . Breast calcifications on mammogram 05/18/2015  . Menorrhagia with regular cycle 05/18/2015  . Left knee pain 05/18/2015  . Severe obesity (BMI >= 40) ([place]) 03/15/2015  . Thyroid activity decreased 03/15/2015  . Vitamin D deficiency 03/15/2015  . Olecranon bursitis of right elbow 02/15/2015  . Essential hypertension 02/15/2015    Past Medical History:  Diagnosis Date  . Anemia   . Hypertension   . OSA on CPAP   . Seizures ([place])   . Thyroid disease    hypo    Past Surgical History:  Procedure Laterality Date  . INTRAUTERINE DEVICE (IUD) INSERTION  06/2016    Current Outpatient Prescriptions  Medication Sig Dispense Refill  . hydrochlorothiazide (HYDRODIURIL) 25 MG tablet Take 1 tablet (25 mg total) by mouth daily. 90 tablet 3  . levonorgestrel (MIRENA) 20 MCG/24HR IUD 1 each by Intrauterine route once.    Marland Kitchen levothyroxine (SYNTHROID,  LEVOTHROID) 112 MCG tablet Take 1 tablet by mouth daily.  4  . losartan (COZAAR) 100 MG tablet Take 1 tablet (100 mg total) by mouth daily. 90 tablet 3  . ergocalciferol (VITAMIN D2) 50000 UNITS capsule Take 1 capsule (50,000 Units total) by mouth once a week. (Patient not taking: Reported on 11/19/2016) 12 capsule 1  . iron polysaccharides (NIFEREX) 150 MG capsule Take 1 capsule (150 mg total) by mouth daily. (Patient not taking: Reported on 11/19/2016) 90 capsule 1   Current Facility-Administered Medications  Medication Dose Route Frequency Provider Last Rate Last Dose  . cyanocobalamin ((VITAMIN B-12)) injection 1,000 mcg  1,000 mcg Intramuscular Q30 days Shawnee Knapp, MD   1,000 mcg at 09/09/16 [id number]     ALLERGIES: Patient has no known allergies.  Family History  Problem Relation Age of Onset  . Hypertension Mother   . Hypertension Brother   . Aneurysm Brother   . Diabetes Paternal Grandfather   . Renal Disease Father     Social History   Social History  . Marital status: Single    Spouse name: N/A  . Number of children: 1  . 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 Smoking status: Never Smoker  .  Smokeless tobacco: Never Used  . Alcohol use No  . Drug use: No  . Sexual activity: Yes    Partners: Male    Birth control/ protection: Condom   Other Topics Concern  . Not on file   Social History Narrative   Single.Exercise 3 times/week Zumba for 1 hour   Drinks about 2-3 cups of coffee/tea a day. Education: [employer].    Review of Systems  Constitutional: Negative.   HENT: Negative.   Eyes: Negative.   Respiratory: Negative.   Cardiovascular: Negative.   Gastrointestinal: Negative.   Genitourinary: Negative.   Musculoskeletal: Negative.   Skin: Negative.   Neurological: Negative.   Endo/Heme/Allergies: Negative.   Psychiatric/Behavioral: Negative.     PHYSICAL EXAMINATION:    BP  120/82 (BP Location: Right Arm, Patient Position: Sitting, Cuff Size: Normal)   Pulse 84   Resp 16   Ht 5\' 7"  (1.702 m)   Wt 230 lb (104.3 kg)   BMI 36.02 kg/m     General appearance: alert, cooperative and appears stated age  Pelvic: External genitalia:  no lesions              Urethra:  normal appearing urethra with no masses, tenderness or lesions              Bartholins and Skenes: normal                 Vagina: normal appearing vagina with normal color and discharge, no lesions              Cervix: no lesions and IUD string 3 cm              Bimanual Exam:  Uterus:  irregular, retroverted and slightly enlarged, not tender, mobile.               Adnexa: no mass, fullness, tenderness               Chaperone was present for exam.  ASSESSMENT Fibroid uterus H/O AUB, resolved with the mirena IUD Postcoital spotting since IUD insertion, normal exam. Patient reasurred H/O anemia H/O vit D def    PLAN F/U for an annual exam in 6 months CBC, ferritin, vit D Call with any concerns   An After Visit Summary was printed and given to the patient.

## 2016-11-20 LAB — VITAMIN D 25 HYDROXY (VIT D DEFICIENCY, FRACTURES): Vit D, 25-Hydroxy: 20 ng/mL — ABNORMAL LOW (ref 30–100)

## 2016-11-20 LAB — FERRITIN: Ferritin: 16 ng/mL (ref 10–232)

## 2016-11-24 ENCOUNTER — Ambulatory Visit (INDEPENDENT_AMBULATORY_CARE_PROVIDER_SITE_OTHER): Payer: 59 | Admitting: Family Medicine

## 2016-11-24 VITALS — BP 128/80 | HR 86 | Temp 98.1°F | Resp 16 | Ht 66.0 in | Wt 217.0 lb

## 2016-11-24 DIAGNOSIS — G40909 Epilepsy, unspecified, not intractable, without status epilepticus: Secondary | ICD-10-CM

## 2016-11-24 DIAGNOSIS — R55 Syncope and collapse: Secondary | ICD-10-CM

## 2016-11-24 DIAGNOSIS — I10 Essential (primary) hypertension: Secondary | ICD-10-CM | POA: Diagnosis not present

## 2016-11-24 LAB — POC MICROSCOPIC URINALYSIS (UMFC)

## 2016-11-24 LAB — POCT URINALYSIS DIP (MANUAL ENTRY)
Bilirubin, UA: NEGATIVE
Glucose, UA: NEGATIVE
Ketones, POC UA: NEGATIVE
Leukocytes, UA: NEGATIVE
Nitrite, UA: NEGATIVE
Protein Ur, POC: NEGATIVE
Spec Grav, UA: 1.025
Urobilinogen, UA: 0.2
pH, UA: 5.5

## 2016-11-24 LAB — POCT CBC
Granulocyte percent: 76.8 %G (ref 37–80)
HCT, POC: 38.7 % (ref 37.7–47.9)
Hemoglobin: 13.1 g/dL (ref 12.2–16.2)
Lymph, poc: 2.3 (ref 0.6–3.4)
MCH, POC: 25.2 pg — AB (ref 27–31.2)
MCHC: 34 g/dL (ref 31.8–35.4)
MCV: 74.1 fL — AB (ref 80–97)
MID (cbc): 0.3 (ref 0–0.9)
MPV: 8.5 fL (ref 0–99.8)
POC Granulocyte: 8.6 — AB (ref 2–6.9)
POC LYMPH PERCENT: 20.9 %L (ref 10–50)
POC MID %: 2.3 %M (ref 0–12)
Platelet Count, POC: 315 10*3/uL (ref 142–424)
RBC: 5.22 M/uL (ref 4.04–5.48)
RDW, POC: 15.2 %
WBC: 11.2 10*3/uL — AB (ref 4.6–10.2)

## 2016-11-24 NOTE — Progress Notes (Signed)
Chief Complaint  Patient presents with  . passed out    x this morning  . Blurred Vision    pt. had blurred vision before passed out     HPI  Syncope and Collapse- new acute concern Pt reports that she was at work on the computer and phone this morning. She felt like her vision was getting blurry.  She states that she felt weak and her coworkers caught her so that she did not hit the floor.  She went to sports med and laid down.  Her blood pressure and sugars were checked  She was told that her sugar was 121 Her blood pressure was 120s but in the normal range. This morning she ate a 1/2 bagel at 9am and the fainting occurred a little after 10.  She reports that she is standing at work then her vision got blurry, she got sweaty and short of breath.  She reports that she has not had a seizure in over 20 years  She reports that she was not feeling ill and had no anxiety or panic attacks.  Hypertension She has a history of hypertension but her blood pressures have been well controlled. She denies chest pains, nausea or vomiting. She took her usual morning medications BP Readings from Last 3 Encounters:  11/24/16 128/80  11/19/16 120/82  09/09/16 124/82     Past Medical History:  Diagnosis Date  . Anemia   . Hypertension   . OSA on CPAP   . Seizures ([place])   . Thyroid disease    hypo    Current Outpatient Prescriptions  Medication Sig Dispense Refill  . ergocalciferol (VITAMIN D2) 50000 UNITS capsule Take 1 capsule (50,000 Units total) by mouth once a week. 12 capsule 1  . hydrochlorothiazide (HYDRODIURIL) 25 MG tablet Take 1 tablet (25 mg total) by mouth daily. 90 tablet 3  . iron polysaccharides (NIFEREX) 150 MG capsule Take 1 capsule (150 mg total) by mouth daily. 90 capsule 1  . levonorgestrel (MIRENA) 20 MCG/24HR IUD 1 each by Intrauterine route once.    Marland Kitchen levothyroxine (SYNTHROID, LEVOTHROID) 112 MCG tablet Take 1 tablet by mouth daily.  4  . losartan (COZAAR) 100  MG tablet Take 1 tablet (100 mg total) by mouth daily. 90 tablet 3   Current Facility-Administered Medications  Medication Dose Route Frequency Provider Last Rate Last Dose  . cyanocobalamin ((VITAMIN B-12)) injection 1,000 mcg  1,000 mcg Intramuscular Q30 days Shawnee Knapp, MD   1,000 mcg at 09/09/16 [id number]    Allergies: No Known Allergies  Past Surgical History:  Procedure Laterality Date  . INTRAUTERINE DEVICE (IUD) INSERTION  06/2016    Social History   Social History  . Marital status: Single    Spouse name: N/A  . Number of children: 1  . 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 Smoking status: Never Smoker  . Smokeless tobacco: Never Used  . Alcohol use No  . Drug use: No  . Sexual activity: Yes    Partners: Male    Birth control/ protection: Condom   Other Topics Concern  . None   Social History Narrative   Single.Exercise 3 times/week Zumba for 1 hour   Drinks about 2-3 cups of coffee/tea a day. Education: [employer].    Review of Systems  Constitutional: Negative for chills, fever and weight loss.  HENT: Negative for hearing loss and tinnitus.  Eyes: Positive for blurred vision.  Cardiovascular: Negative for chest pain and palpitations.  Gastrointestinal: Negative for abdominal pain, nausea and vomiting.  Genitourinary: Negative for dysuria, frequency and urgency.  Musculoskeletal: Negative for myalgias and neck pain.  Skin: Negative for itching and rash.  Neurological: Positive for dizziness. Negative for tingling, tremors and headaches.  Psychiatric/Behavioral: The patient is not nervous/anxious and does not have insomnia.     Objective: Vitals:   11/24/16 1150  BP: 128/80  Pulse: 86  Resp: 16  Temp: 98.1 F (36.7 C)  TempSrc: Oral  SpO2: 98%  Weight: 217 lb (98.4 kg)  Height: 5\' 6"  (1.676 m)    Physical Exam  Constitutional: She is oriented to person, place, and  time. She appears well-developed and well-nourished.  HENT:  Head: Normocephalic and atraumatic.  Right Ear: External ear normal.  Left Ear: External ear normal.  Nose: Nose normal.  Mouth/Throat: Oropharynx is clear and moist.  Eyes: Conjunctivae and EOM are normal. Pupils are equal, round, and reactive to light.  Neck: Normal range of motion. Neck supple.  Cardiovascular: Normal rate, regular rhythm and normal heart sounds.   No murmur heard. Pulmonary/Chest: Effort normal and breath sounds normal. No respiratory distress. She has no wheezes.  Abdominal: Soft. Bowel sounds are normal. She exhibits no distension. There is no tenderness. There is no guarding.  Musculoskeletal: Normal range of motion. She exhibits no edema.  Neurological: She is alert and oriented to person, place, and time. She displays normal reflexes. No cranial nerve deficit or sensory deficit. She exhibits normal muscle tone. Coordination normal.  Skin: Skin is warm. Capillary refill takes less than 2 seconds. No erythema.     Independent review of ECG NSR, no ST elevation or TWI  Assessment and Plan Dusty was seen today for passed out and blurred vision.  Diagnoses and all orders for this visit:  Syncope and collapse- not likely vasovagal Could be dehydration Based on results of labs and orthostatics pt is stable to go home Gave work note Advised pt to half her dose of bp medication tomorrow -     Comprehensive metabolic panel -     Lipid panel -     POCT CBC -     POCT urinalysis dipstick -     POCT Microscopic Urinalysis ([facility]) -     EKG 12-Lead -     Orthostatic vital signs  Essential hypertension- bp well controlled, advised her to half her bp medication dose for the next 24 hours  Seizure disorder ([place])- stable for over 20 years and she shows no evidence of postictal states So given that this was witnessed in a health care settings seizure is less concerning  A total of 40 minutes were spent  face-to-face with the patient during this encounter and over half of that time was spent on counseling and coordination of care.    [place]

## 2016-11-24 NOTE — Patient Instructions (Addendum)
     IF you received an x-ray today, you will receive an invoice from [facility]. Please contact [facility] at [phone] with questions or concerns regarding your invoice.   IF you received labwork today, you will receive an invoice from [facility]. Please contact [facility] at [phone] with questions or concerns regarding your invoice.   Our billing staff will not be able to assist you with questions regarding bills from these companies.  You will be contacted with the lab results as soon as they are available. The fastest way to get your results is to activate your My Chart account. Instructions are located on the last page of this paperwork. If you have not heard from [place] regarding the results in 2 weeks, please contact this office.      Syncope Syncope is when you temporarily lose consciousness. Syncope may also be called fainting or passing out. It is caused by a sudden decrease in blood flow to the brain. Even though most causes of syncope are not dangerous, syncope can be a sign of a serious medical problem. Signs that you may be about to faint include:  Feeling dizzy or light-headed.  Feeling nauseous.  Seeing all white or all black in your field of vision.  Having cold, clammy skin. If you fainted, get medical help right away.Call your local emergency services (911 in the [place] Do not drive yourself to the hospital. Follow these instructions at home: Pay attention to any changes in your symptoms. Take these actions to help with your condition:  Have someone stay with you until you feel stable.  Do not drive, use machinery, or play sports until your health care provider says it is okay.  Keep all follow-up visits as told by your health care provider. This is important.  If you start to feel like you might faint, lie down right away and raise (elevate) your feet above the level of your heart. Breathe deeply and steadily. Wait until all of the symptoms  have passed.  Drink enough fluid to keep your urine clear or pale yellow.  If you are taking blood pressure or heart medicine, get up slowly and take several minutes to sit and then stand. This can reduce dizziness.  Take over-the-counter and prescription medicines only as told by your health care provider. Get help right away if:  You have a severe headache.  You have unusual pain in your chest, abdomen, or back.  You are bleeding from your mouth or rectum, or you have black or tarry stool.  You have a very fast or irregular heartbeat (palpitations).  You have pain with breathing.  You faint once or repeatedly.  You have a seizure.  You are confused.  You have trouble walking.  You have severe weakness.  You have vision problems. These symptoms may represent a serious problem that is an emergency. Do not wait to see if your symptoms will go away. Get medical help right away. Call your local emergency services (911 in the [place] Do not drive yourself to the hospital.  This information is not intended to replace advice given to you by your health care provider. Make sure you discuss any questions you have with your health care provider. Document Released: 11/24/2005 Document Revised: 05/01/2016 Document Reviewed: 08/08/2015 Elsevier Interactive Patient Education  2017 [employer].

## 2016-11-25 LAB — COMPREHENSIVE METABOLIC PANEL
ALT: 8 IU/L (ref 0–32)
AST: 15 IU/L (ref 0–40)
Albumin/Globulin Ratio: 1.3 (ref 1.2–2.2)
Albumin: 4.7 g/dL (ref 3.5–5.5)
Alkaline Phosphatase: 96 IU/L (ref 39–117)
BUN/Creatinine Ratio: 24 — ABNORMAL HIGH (ref 9–23)
BUN: 25 mg/dL — ABNORMAL HIGH (ref 6–24)
Bilirubin Total: 0.4 mg/dL (ref 0.0–1.2)
CO2: 23 mmol/L (ref 18–29)
Calcium: 10 mg/dL (ref 8.7–10.2)
Chloride: 97 mmol/L (ref 96–106)
Creatinine, Ser: 1.03 mg/dL — ABNORMAL HIGH (ref 0.57–1.00)
GFR calc Af Amer: 76 mL/min/{1.73_m2} (ref >59)
GFR calc non Af Amer: 66 mL/min/{1.73_m2} (ref >59)
Globulin, Total: 3.6 g/dL (ref 1.5–4.5)
Glucose: 78 mg/dL (ref 65–99)
Potassium: 4.3 mmol/L (ref 3.5–5.2)
Sodium: 139 mmol/L (ref 134–144)
Total Protein: 8.3 g/dL (ref 6.0–8.5)

## 2016-11-26 LAB — LIPID PANEL
Chol/HDL Ratio: 5.2 ratio units — ABNORMAL HIGH (ref 0.0–4.4)
Cholesterol, Total: 233 mg/dL — ABNORMAL HIGH (ref 100–199)
HDL: 45 mg/dL (ref >39)
LDL Calculated: 170 mg/dL — ABNORMAL HIGH (ref 0–99)
Triglycerides: 89 mg/dL (ref 0–149)
VLDL Cholesterol Cal: 18 mg/dL (ref 5–40)

## 2016-11-26 LAB — SPECIMEN STATUS REPORT

## 2016-12-04 MED FILL — POLYETHYLENE GLYCOL 3350 PO: 44 days supply | Qty: 1581 | Fill #3

## 2016-12-04 MED FILL — LOSARTAN POTASSIUM 100 MG T: 100 | 90 days supply | Qty: 90 | Fill #4

## 2016-12-04 MED FILL — LEVOTHYROXINE 112 MCG TAB: 112 | 90 days supply | Qty: 90 | Fill #0

## 2016-12-04 MED FILL — HYDROCHLOROTHIAZIDE 25 MG T: 25 | 90 days supply | Qty: 90 | Fill #3

## 2016-12-10 ENCOUNTER — Other Ambulatory Visit: Payer: Self-pay | Admitting: Family Medicine

## 2016-12-11 NOTE — Telephone Encounter (Signed)
It looks like her gynecologist was suppose to send labs that her iron was low again.   It was at 20 down from 28 from 3 months ago.

## 2016-12-12 MED FILL — VIT D2 1.25 MG (50,000 UNIT: 1.25 MG | 84 days supply | Qty: 12 | Fill #0

## 2017-01-14 MED FILL — ALL DAY ALLERGY 10 MG TAB: 10 | 100 days supply | Qty: 100 | Fill #0

## 2017-01-28 ENCOUNTER — Encounter: Payer: 59 | Attending: Family Medicine | Admitting: Registered"

## 2017-01-28 DIAGNOSIS — Z713 Dietary counseling and surveillance: Secondary | ICD-10-CM | POA: Diagnosis not present

## 2017-01-28 NOTE — Progress Notes (Signed)
Patient was seen on 01/28/2017 for the Weight Loss Class at the [facility]. The following learning objectives were met by the patient during this class:   Describe healthy choices in each food group  Describe portion size of foods  Use plate method for meal planning  Demonstrate how to read Nutrition Facts food label  Set realistic goals for weight loss, diet changes, and physical activity.   Goals:  1. Make healthy food choices in each food group.  2. Reduce portion size of foods.  3. Increase fruit and vegetable intake.  4. Use plate method for meal planning.  5. Increase physical activity.    Handouts given:   1. Power Point of presentation   2. Meal plan form    3. Which Drinks have Sugar   4. Weight Management Apps and Websites   

## 2017-02-14 DIAGNOSIS — H5213 Myopia, bilateral: Secondary | ICD-10-CM | POA: Diagnosis not present

## 2017-03-25 ENCOUNTER — Encounter: Payer: Self-pay | Admitting: Family Medicine

## 2017-03-28 ENCOUNTER — Other Ambulatory Visit: Payer: Self-pay

## 2017-03-28 MED ORDER — LOSARTAN POTASSIUM 100 MG PO TABS: 100.0000 mg | ORAL_TABLET | Freq: Every day | ORAL | 0 refills | Status: DC

## 2017-03-28 MED ORDER — HYDROCHLOROTHIAZIDE 25 MG PO TABS: 25.0000 mg | ORAL_TABLET | Freq: Every day | ORAL | 0 refills | Status: DC

## 2017-03-28 NOTE — Telephone Encounter (Signed)
My chart request for BP med refills.  Responded to pt via mychart she needs follow up within the month for evaluation by Pam Mckee.  BUN & creatinine were up on last labs 11/2016.  Sent 30 days w/message to return for follow up

## 2017-03-30 MED FILL — HYDROCHLOROTHIAZIDE 25 MG T: 25 | 30 days supply | Qty: 30 | Fill #0

## 2017-03-30 MED FILL — LOSARTAN POTASSIUM 100 MG T: 100 | 30 days supply | Qty: 30 | Fill #0

## 2017-03-31 MED FILL — LEVOTHYROXINE 112 MCG TAB: 112 | 90 days supply | Qty: 90 | Fill #1

## 2017-04-02 ENCOUNTER — Encounter: Payer: Self-pay | Admitting: Family Medicine

## 2017-04-02 ENCOUNTER — Ambulatory Visit (INDEPENDENT_AMBULATORY_CARE_PROVIDER_SITE_OTHER): Payer: 59 | Admitting: Family Medicine

## 2017-04-02 VITALS — BP 124/82 | HR 72 | Temp 97.8°F | Resp 16 | Ht 65.5 in | Wt 241.8 lb

## 2017-04-02 DIAGNOSIS — I10 Essential (primary) hypertension: Secondary | ICD-10-CM | POA: Diagnosis not present

## 2017-04-02 DIAGNOSIS — E538 Deficiency of other specified B group vitamins: Secondary | ICD-10-CM

## 2017-04-02 DIAGNOSIS — E559 Vitamin D deficiency, unspecified: Secondary | ICD-10-CM | POA: Diagnosis not present

## 2017-04-02 DIAGNOSIS — D500 Iron deficiency anemia secondary to blood loss (chronic): Secondary | ICD-10-CM

## 2017-04-02 MED ORDER — LOSARTAN POTASSIUM 100 MG PO TABS: 100.0000 mg | ORAL_TABLET | Freq: Every day | ORAL | 3 refills | Status: DC

## 2017-04-02 MED ORDER — CYANOCOBALAMIN 1000 MCG/ML IJ SOLN: 1000.0000 ug | INTRAMUSCULAR | 0 refills | Status: DC

## 2017-04-02 MED ORDER — HYDROCHLOROTHIAZIDE 25 MG PO TABS: 25.0000 mg | ORAL_TABLET | Freq: Every day | ORAL | 3 refills | Status: DC

## 2017-04-02 MED ORDER — CYANOCOBALAMIN 1000 MCG/ML IJ SOLN
1000.0000 ug | Freq: Once | INTRAMUSCULAR | Status: AC
  Administered 2017-04-02: 1000 ug via INTRAMUSCULAR

## 2017-04-02 NOTE — Progress Notes (Signed)
Subjective:    Patient ID: Pam Mckee, female    DOB: 08-11-1971, 46 y.o.   MRN: [id number]  HPI  Pam Mckee is a delightful 46 year old woman here for refills of her blood pressure medications. She was last seen here 4 months prior by one of my colleagues after she had a near-syncopal episode while standing at work.  Fortunately her workup at that time was normal and has not recurred since. Patient attributed that to her eating patterns that day and has been more conscientious of them since.  Takes vitamin D periodically.  Pam Mckee works at the [employer] and so does have her blood pressure checked periodically and has been excellent at 110s over 70s. She states today's reading of 124/82 is actually on the high side for her. Denies any orthostatic symptoms. Denies any urinary frequency, myalgias, peripheral edema, shortness of breath, wheeze, dry cough, facial swelling, chest pain, palpitations.  Cut back on soda and walking every day to help with weight loss.   Pam Mckee has her well woman exam scheduled with Dr. Talbert Nan in less than 2 months - June 14. Spotting but no periods so no longer taking the iron supp. Is getting iron in her diet.  Pam Mckee has her thyroid monitored annually by Dr. Chalmers Cater and reports her medication has been stable.  She had a FNA biopsy and won't need another for several years.   Pam Mckee states that after she was given a vitamin B-12 injection for the low vitamin B12 last year (323) she felt immensely better with more energy for several weeks afterwards. She has not attempted taking an oral B12 supplement. She would like to start receiving the monthly injections and feels confident one of the pharmacists at her work would be able to administer these for her.  Past Medical History:  Diagnosis Date  . Anemia   . Hypertension   . OSA on CPAP   . Seizures ([place])   . Thyroid disease    hypo   Past Surgical History:  Procedure Laterality Date  . INTRAUTERINE DEVICE (IUD)  INSERTION  06/2016   Current Outpatient Prescriptions on File Prior to Visit  Medication Sig Dispense Refill  . ALL DAY ALLERGY 10 MG tablet TAKE 1 TABLET BY MOUTH AT BEDTIME. 30 tablet 11  . levonorgestrel (MIRENA) 20 MCG/24HR IUD 1 each by Intrauterine route once.    Marland Kitchen levothyroxine (SYNTHROID, LEVOTHROID) 112 MCG tablet Take 1 tablet by mouth daily.  4   Current Facility-Administered Medications on File Prior to Visit  Medication Dose Route Frequency Provider Last Rate Last Dose  . cyanocobalamin ((VITAMIN B-12)) injection 1,000 mcg  1,000 mcg Intramuscular Q30 days Shawnee Knapp, MD   1,000 mcg at 09/09/16 [id number]   No Known Allergies Family History  Problem Relation Age of Onset  . Hypertension Mother   . Hypertension Brother   . Aneurysm Brother   . Diabetes Paternal Grandfather   . Renal Disease Father    Social History   Social History  . Marital status: Single    Spouse name: N/A  . Number of children: 1  . 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 Smoking status: Never Smoker  . Smokeless tobacco: Never Used  . Alcohol use No  . Drug use: No  . Sexual activity: Yes    Partners: Male    Birth control/ protection: Condom   Other Topics Concern  .  None   Social History Narrative   Single.Exercise 3 times/week Zumba for 1 hour   Drinks about 2-3 cups of coffee/tea a day. Education: [employer].   Depression screen [facility] 2/9 04/02/2017 11/24/2016 09/09/2016 05/17/2016 04/24/2016  Decreased Interest 0 0 0 0 0  Down, Depressed, Hopeless 0 0 0 0 1  PHQ - 2 Score 0 0 0 0 1    Review of Systems See hpi    Objective:   Physical Exam  Constitutional: She is oriented to person, place, and time. She appears well-developed and well-nourished. No distress.  HENT:  Head: Normocephalic and atraumatic.  Right Ear: External ear normal.  Left Ear: External ear normal.  Eyes: Conjunctivae are normal. No  scleral icterus.  Neck: Normal range of motion. Neck supple. Thyromegaly present.  Cardiovascular: Normal rate, regular rhythm, normal heart sounds and intact distal pulses.   Pulmonary/Chest: Effort normal and breath sounds normal. No respiratory distress.  Musculoskeletal: She exhibits no edema.  Lymphadenopathy:    She has no cervical adenopathy.  Neurological: She is alert and oriented to person, place, and time.  Skin: Skin is warm and dry. She is not diaphoretic. No erythema.  Psychiatric: She has a normal mood and affect. Her behavior is normal.          Assessment & Plan:  Vit D, ferritin, cbc, cmp, lipid, vit B12 - pt not fasting today but will have labs drawn at her annual well woman visit with Dr. Talbert Nan on 6/14 so will ask that below labs be checked as well at that time.  She will repeat the B12 on the 24 and she will have her b12 level check 3 wks after her 2nd dose  1. Essential hypertension - excellent control, cont current regimen  2. Vitamin B12 deficiency   3. Iron deficiency anemia due to chronic blood loss   4. Vitamin D deficiency     Orders Placed This Encounter  Procedures  . VITAMIN D 25 Hydroxy (Vit-D Deficiency, Fractures)    Standing Status:   Future    Standing Expiration Date:   04/05/2018  . Vitamin B12    Standing Status:   Future    Standing Expiration Date:   04/05/2018  . Lipid panel    Standing Status:   Future    Standing Expiration Date:   04/05/2018    Order Specific Question:   Has the patient fasted?    Answer:   Yes  . CBC    Standing Status:   Future    Standing Expiration Date:   04/05/2018  . Comprehensive metabolic panel    Standing Status:   Future    Standing Expiration Date:   04/05/2018    Order Specific Question:   Has the patient fasted?    Answer:   Yes  . Ferritin    Standing Status:   Future    Standing Expiration Date:   04/05/2018  . Care order/instruction:    Scheduling Instructions:     Complete orders, AVS and  go.    Meds ordered this encounter  Medications  . cyanocobalamin ((VITAMIN B-12)) injection 1,000 mcg  . hydrochlorothiazide (HYDRODIURIL) 25 MG tablet    Sig: Take 1 tablet (25 mg total) by mouth daily.    Dispense:  90 tablet    Refill:  3  . losartan (COZAAR) 100 MG tablet    Sig: Take 1 tablet (100 mg total) by mouth daily.    Dispense:  90 tablet  Refill:  3  . cyanocobalamin (,VITAMIN B-12,) 1000 MCG/ML injection    Sig: Inject 1 mL (1,000 mcg total) into the muscle every 30 (thirty) days.    Dispense:  5 mL    Refill:  0    Ok for pharmacist to administer or self-administer. First dose due 5/24, then q28-30d      [name], M.D.  Primary Care at [facility] [address] [place], [place] [address] [phone] phone [phone] fax  04/05/17 9:12 AM

## 2017-04-02 NOTE — Patient Instructions (Addendum)
IF you received an x-ray today, you will receive an invoice from [facility]. Please contact [facility] at [phone] with questions or concerns regarding your invoice.   IF you received labwork today, you will receive an invoice from [facility]. Please contact [facility] at [phone] with questions or concerns regarding your invoice.   Our billing staff will not be able to assist you with questions regarding bills from these companies.  You will be contacted with the lab results as soon as they are available. The fastest way to get your results is to activate your My Chart account. Instructions are located on the last page of this paperwork. If you have not heard from [place] regarding the results in 2 weeks, please contact this office.    We recommend that you schedule a mammogram for breast cancer screening. Typically, you do not need a referral to do this. Please contact a local imaging center to schedule your mammogram.  [name] - [phone]  *ask for the Radiology Department [facility] ([place]) - [phone] or [phone]  [facility] - [phone] [place] [phone] [facility] - [phone]  *ask for the [facility] - [phone]  *ask for the Radiology Department [facility] - [phone]  *ask for the Mammography Department [facility] - [phone]   Vitamin B12 Deficiency Vitamin B12 deficiency occurs when the body does not have enough vitamin B12. Vitamin B12 is an important vitamin. The body needs vitamin B12:  To make red blood cells.  To make DNA. This is the genetic material inside cells.  To help the nerves work properly so they can carry messages from the brain to the body. Vitamin B12 deficiency can cause various health problems, such as a low red blood cell count (anemia) or nerve damage. What  are the causes? This condition may be caused by:  Not eating enough foods that contain vitamin B12.  Not having enough stomach acid and digestive fluids to properly absorb vitamin B12 from the food that you eat.  Certain digestive system diseases that make it hard to absorb vitamin B12. These diseases include Crohn disease, chronic pancreatitis, and cystic fibrosis.  Pernicious anemia. This is a condition in which the body does not make enough of a protein (intrinsic factor), resulting in too few red blood cells.  Having a surgery in which part of the stomach or small intestine is removed.  Taking certain medicines that make it hard for the body to absorb vitamin B12. These medicines include:  Heartburn medicine (antacids and proton pump inhibitors).  An antibiotic medicine called neomycin.  Some medicines that are used to treat diabetes, tuberculosis, gout, or high cholesterol. What increases the risk? The following factors may make you more likely to develop a B12 deficiency:  Being older than age 49.  Eating a vegetarian or vegan diet, especially while you are pregnant.  Eating a poor diet while you are pregnant.  Taking certain drugs.  Having alcoholism. What are the signs or symptoms? In some cases, there are no symptoms of this condition. If the condition leads to anemia or nerve damage, various symptoms can occur, such as:  Weakness.  Fatigue.  Loss of appetite.  Weight loss.  Numbness or tingling in your hands and feet.  Redness and burning of the tongue.  Confusion or memory problems.  Depression.  Sensory problems, such as color blindness,  ringing in the ears, or loss of taste.  Diarrhea or constipation.  Trouble walking. If anemia is severe, symptoms can include:  Shortness of breath.  Dizziness.  Rapid heart rate (tachycardia). How is this diagnosed? This condition may be diagnosed with a blood test to measure the level of vitamin B12 in  your blood. You may have other tests to help find the cause of your vitamin B12 deficiency. These tests may include:  A complete blood count (CBC). This is a group of tests that measure certain characteristics of blood cells.  A blood test to measure intrinsic factor.  An endoscopy. In this procedure, a thin tube with a camera on the end is used to look into your stomach or intestines. How is this treated? Treatment for this condition depends on the cause. Common treatment options include:  Changing your eating and drinking habits, such as:  Eating more foods that contain vitamin B12.  Drinking less alcohol or no alcohol.  Taking vitamin B12 supplements. Your health care provider will tell you which dosage is best for you.  Getting vitamin B12 injections. Follow these instructions at home:  Take supplements only as told by your health care provider. Follow the directions carefully.  Get any injections that are prescribed by your health care provider.  Do not miss your appointments.  Eat lots of healthy foods that contain vitamin B12. Ask your health care provider if you should work with a dietitian. Foods that contain vitamin B12 include:  Meat.  Meat from birds (poultry).  Fish.  Eggs.  Cereal and dairy products that are fortified. This means that vitamin B12 has been added to the food. Check the label on the package to see if the food is fortified.  Do not abuse alcohol.  Keep all follow-up visits as told by your health care provider. This is important. Contact a health care provider if:  Your symptoms come back. Get help right away if:  You develop shortness of breath.  You have chest pain.  You become dizzy or you lose consciousness. This information is not intended to replace advice given to you by your health care provider. Make sure you discuss any questions you have with your health care provider. Document Released: 02/16/2012 Document Revised: 05/07/2016  Document Reviewed: 04/11/2015 Elsevier Interactive Patient Education  2017 [employer].

## 2017-04-02 NOTE — Progress Notes (Signed)
   Subjective:    Patient ID: Pam Mckee, female    DOB: 01-Nov-1971, 46 y.o.   MRN: [id number] Chief Complaint  Patient presents with  . Medication Refill    HCTZ and COZAAR    HPI  Pam Mckee is a delightful 46 year old woman here for refills of her blood pressure medications. She was last seen here 4 months prior by one of my colleagues after she had a near-syncopal episode while standing at work.  Fortunately her workup at that time was normal and has not recurred since. Patient attributed that to her eating patterns that day and has been more conscientious of them since.  Pam Mckee works at the [employer] and so does have her blood pressure checked periodically and has been excellent at 110s over 70s. She states today's reading of 124/82 is actually on the high side for her. Denies any orthostatic symptoms. Denies any urinary frequency, myalgias, peripheral edema, shortness of breath, wheeze, dry cough, facial swelling, chest pain, palpitations.   Pam Mckee has her well woman exam scheduled with Dr. Talbert Nan in less than 2 months.  Pam Mckee has her thyroid monitored annually by Dr. Redmond Pulling and reports her medication has been stable.  Pam Mckee states that after she was given a vitamin B-12 injection for the low vitamin B12 last year she felt immensely better with more energy for several weeks afterwards. She has not attempted taking an oral B12 supplement. She would like to start receiving the monthly injections and feels confident one of the pharmacists at her work would be able to administer these for her. Review of Systems See hpi    Objective:   Physical Exam        Assessment & Plan:

## 2017-04-23 MED FILL — ALL DAY ALLERGY 10 MG TAB: 10 | 100 days supply | Qty: 100 | Fill #1

## 2017-04-29 MED FILL — LOSARTAN POTASSIUM 100 MG T: 100 | 90 days supply | Qty: 90 | Fill #0

## 2017-04-29 MED FILL — CYANOCOBALAMIN 1,000 MCG/ML: 1000 | 90 days supply | Qty: 3 | Fill #0

## 2017-04-29 MED FILL — HYDROCHLOROTHIAZIDE 25 MG T: 25 | 90 days supply | Qty: 90 | Fill #0

## 2017-05-21 ENCOUNTER — Ambulatory Visit (INDEPENDENT_AMBULATORY_CARE_PROVIDER_SITE_OTHER): Payer: 59 | Admitting: Obstetrics and Gynecology

## 2017-05-21 ENCOUNTER — Encounter: Payer: Self-pay | Admitting: Obstetrics and Gynecology

## 2017-05-21 VITALS — BP 120/80 | HR 64 | Resp 16 | Ht 65.75 in | Wt 249.2 lb

## 2017-05-21 DIAGNOSIS — D259 Leiomyoma of uterus, unspecified: Secondary | ICD-10-CM | POA: Diagnosis not present

## 2017-05-21 DIAGNOSIS — Z862 Personal history of diseases of the blood and blood-forming organs and certain disorders involving the immune mechanism: Secondary | ICD-10-CM | POA: Diagnosis not present

## 2017-05-21 DIAGNOSIS — Z8349 Family history of other endocrine, nutritional and metabolic diseases: Secondary | ICD-10-CM | POA: Diagnosis not present

## 2017-05-21 DIAGNOSIS — Z0000 Encounter for general adult medical examination without abnormal findings: Secondary | ICD-10-CM

## 2017-05-21 DIAGNOSIS — Z30431 Encounter for routine checking of intrauterine contraceptive device: Secondary | ICD-10-CM | POA: Diagnosis not present

## 2017-05-21 DIAGNOSIS — Z8639 Personal history of other endocrine, nutritional and metabolic disease: Secondary | ICD-10-CM | POA: Diagnosis not present

## 2017-05-21 DIAGNOSIS — E663 Overweight: Secondary | ICD-10-CM

## 2017-05-21 DIAGNOSIS — Z01419 Encounter for gynecological examination (general) (routine) without abnormal findings: Secondary | ICD-10-CM

## 2017-05-21 NOTE — Progress Notes (Signed)
46 y.o. [place] here for annual exam.   She has a h/o a fibroid uterus, menorrhagia and anemia. She had a mirena IUD placed in 7/17. No cycles with the IUD, just occasional spotting.   Sexually active, no pain. Same partner x 3 years.   H/O anemia, vit d def, elevated lipids in the past. She has also been getting vit B12 from her primary for low B12. Due for blood work, requests it be done now. Will send a copy to her primary.   No LMP recorded. Patient is not currently having periods (Reason: IUD).          Sexually active: Yes.    The current method of family planning is IUD.    Exercising: Yes.    Walking Smoker:  no  Health Maintenance: Pap: 05/18/15 Neg; Neg HR HPV; 06/29/14 Neg History of abnormal Pap:  no MMG: 10/03/16 BIRADS1, Density [facility] TDaP: 12/2011   reports that she has never smoked. She has never used smokeless tobacco. She reports that she does not drink alcohol or use drugs. She works as a Occupational psychologist. Son is 70, just moved to [place], driving buses. He went to school at [employer] for 3 years, only one year to go. Will finish in [place].   Past Medical History:  Diagnosis Date  . Anemia   . Hypertension   . OSA on CPAP   . Seizures ([place])   . Thyroid disease    hypo    Past Surgical History:  Procedure Laterality Date  . INTRAUTERINE DEVICE (IUD) INSERTION  06/2016    Current Outpatient Prescriptions  Medication Sig Dispense Refill  . ALL DAY ALLERGY 10 MG tablet TAKE 1 TABLET BY MOUTH AT BEDTIME. 30 tablet 11  . cyanocobalamin (,VITAMIN B-12,) 1000 MCG/ML injection Inject 1 mL (1,000 mcg total) into the muscle every 30 (thirty) days. 5 mL 0  . hydrochlorothiazide (HYDRODIURIL) 25 MG tablet Take 1 tablet (25 mg total) by mouth daily. 90 tablet 3  . levonorgestrel (MIRENA) 20 MCG/24HR IUD 1 each by Intrauterine route once.    Marland Kitchen levothyroxine (SYNTHROID, LEVOTHROID) 112 MCG tablet Take 1 tablet by mouth daily.  4  . losartan (COZAAR) 100 MG  tablet Take 1 tablet (100 mg total) by mouth daily. 90 tablet 3   Current Facility-Administered Medications  Medication Dose Route Frequency Provider Last Rate Last Dose  . cyanocobalamin ((VITAMIN B-12)) injection 1,000 mcg  1,000 mcg Intramuscular Q30 days Shawnee Knapp, MD   1,000 mcg at 09/09/16 8413    Family History  Problem Relation Age of Onset  . Hypertension Mother   . Hypertension Brother   . Aneurysm Brother   . Diabetes Paternal Grandfather   . Renal Disease Father     Review of Systems  Constitutional: Negative.   HENT: Negative.   Eyes: Negative.   Respiratory: Negative.   Cardiovascular: Negative.   Gastrointestinal: Negative.   Endocrine: Negative.   Genitourinary: Negative.   Musculoskeletal: Negative.   Skin: Negative.   Allergic/Immunologic: Negative.   Neurological: Negative.   Hematological: Negative.   Psychiatric/Behavioral: Negative.     Exam:   BP 120/80 (BP Location: Right Arm, Patient Position: Sitting, Cuff Size: Normal)   Pulse 64   Resp 16   Ht 5' 5.75" (1.67 m)   Wt 249 lb 3.2 oz (113 kg)   BMI 40.53 kg/m   Weight change: @WEIGHTCHANGE @ Height:   Height: 5' 5.75" (167 cm)  Ht Readings  from Last 3 Encounters:  05/21/17 5' 5.75" (1.67 m)  04/02/17 5' 5.5" (1.664 m)  11/24/16 5\' 6"  (1.676 m)    General appearance: alert, cooperative and appears stated age Head: Normocephalic, without obvious abnormality, atraumatic Neck: no adenopathy, supple, symmetrical, trachea midline and thyroid normal to inspection and palpation Lungs: clear to auscultation bilaterally Cardiovascular: regular rate and rhythm Breasts: normal appearance, no masses or tenderness Abdomen: soft, non-tender; bowel sounds normal; no masses,  no organomegaly Extremities: extremities normal, atraumatic, no cyanosis or edema Skin: Skin color, texture, turgor normal. No rashes or lesions Lymph nodes: Cervical, supraclavicular, and axillary nodes normal. No abnormal  inguinal nodes palpated Neurologic: Grossly normal   Pelvic: External genitalia:  no lesions              Urethra:  normal appearing urethra with no masses, tenderness or lesions              Bartholins and Skenes: normal                 Vagina: normal appearing vagina with normal color and discharge, no lesions              Cervix: no lesions and IUD string 3-4 cm               Bimanual Exam:  Uterus:  irregular, 10 week sized uterus, not tender              Adnexa: no mass, fullness, tenderness               Rectovaginal: Confirms               Anus:  normal sphincter tone, no lesions  Chaperone was present for exam.  A:  Well Woman with normal exam  IUD check  H/O fibroid uterus and menorrhagia, controlled with the mirena IUD  H/O anemia  H/O vit d def  H/O low B12  Overweight, lost 45 lbs last year, started to gain weight back  H/O elevated lipids  P:   Pap due next year  Mammogram in 10/18  Screening labs  Discussed breast self exam  Discussed calcium and vit D intake  CC: [name], MD

## 2017-05-21 NOTE — Patient Instructions (Signed)

## 2017-05-22 LAB — LIPID PANEL
Chol/HDL Ratio: 3.7 ratio (ref 0.0–4.4)
Cholesterol, Total: 176 mg/dL (ref 100–199)
HDL: 48 mg/dL (ref >39)
LDL Calculated: 116 mg/dL — ABNORMAL HIGH (ref 0–99)
Triglycerides: 59 mg/dL (ref 0–149)
VLDL Cholesterol Cal: 12 mg/dL (ref 5–40)

## 2017-05-22 LAB — COMPREHENSIVE METABOLIC PANEL
ALT: 11 IU/L (ref 0–32)
AST: 14 IU/L (ref 0–40)
Albumin/Globulin Ratio: 1.2 (ref 1.2–2.2)
Albumin: 3.7 g/dL (ref 3.5–5.5)
Alkaline Phosphatase: 69 IU/L (ref 39–117)
BUN/Creatinine Ratio: 24 — ABNORMAL HIGH (ref 9–23)
BUN: 18 mg/dL (ref 6–24)
Bilirubin Total: 0.3 mg/dL (ref 0.0–1.2)
CO2: 25 mmol/L (ref 20–29)
Calcium: 8.9 mg/dL (ref 8.7–10.2)
Chloride: 102 mmol/L (ref 96–106)
Creatinine, Ser: 0.75 mg/dL (ref 0.57–1.00)
GFR calc Af Amer: 111 mL/min/{1.73_m2} (ref >59)
GFR calc non Af Amer: 97 mL/min/{1.73_m2} (ref >59)
Globulin, Total: 3 g/dL (ref 1.5–4.5)
Glucose: 83 mg/dL (ref 65–99)
Potassium: 4 mmol/L (ref 3.5–5.2)
Sodium: 139 mmol/L (ref 134–144)
Total Protein: 6.7 g/dL (ref 6.0–8.5)

## 2017-05-22 LAB — VITAMIN B12: Vitamin B-12: 437 pg/mL (ref 232–1245)

## 2017-05-22 LAB — CBC
Hematocrit: 35.7 % (ref 34.0–46.6)
Hemoglobin: 11 g/dL — ABNORMAL LOW (ref 11.1–15.9)
MCH: 24.8 pg — ABNORMAL LOW (ref 26.6–33.0)
MCHC: 30.8 g/dL — ABNORMAL LOW (ref 31.5–35.7)
MCV: 80 fL (ref 79–97)
Platelets: 285 10*3/uL (ref 150–379)
RBC: 4.44 x10E6/uL (ref 3.77–5.28)
RDW: 15.3 % (ref 12.3–15.4)
WBC: 7.6 10*3/uL (ref 3.4–10.8)

## 2017-05-22 LAB — VITAMIN D 25 HYDROXY (VIT D DEFICIENCY, FRACTURES): Vit D, 25-Hydroxy: 15.6 ng/mL — ABNORMAL LOW (ref 30.0–100.0)

## 2017-05-22 LAB — HEMOGLOBIN A1C
Est. average glucose Bld gHb Est-mCnc: 108 mg/dL
Hgb A1c MFr Bld: 5.4 % (ref 4.8–5.6)

## 2017-05-22 LAB — FERRITIN: Ferritin: 36 ng/mL (ref 15–150)

## 2017-05-26 ENCOUNTER — Telehealth: Payer: Self-pay | Admitting: *Deleted

## 2017-05-26 DIAGNOSIS — E559 Vitamin D deficiency, unspecified: Secondary | ICD-10-CM

## 2017-05-26 NOTE — Telephone Encounter (Signed)
-----   Message from Salvadore Dom, MD sent at 05/25/2017  5:57 PM EDT ----- Please let the patient know that she is mildly anemic. Given that she isn't bleeding vaginally, we can't blame this on her fibroid uterus. Her Ferritin (iron stores are in the low normal range). She should f/u with her primary for her anemia.  Her vit D is low, please have her start 2,000 IU of vit D 3 daily. She should have her vit d level rechecked in 3 months (please set this up) Her LDL is slightly elevated, but improved from 6 months ago! The rest of her lipid panel is fine.  The resto of her blood work was normal. Please send a copy of all of her labs to her primary MD.

## 2017-05-26 NOTE — Telephone Encounter (Signed)
Left message to call Jidenna Figgs at [phone].  

## 2017-05-26 NOTE — Telephone Encounter (Signed)
Spoke with patient, advised of results and recommendations as seen below per Dr. Talbert Nan. Patient scheduled for lab appointment on 08/31/17 at 9:45am for Vit D recheck. Patient will call to schedule f/u with pcp, Dr. Brigitte Pulse. Patient verbalizes understanding and is agreeable.  Copy of labs dated 05/21/17 faxed to Dr. Brigitte Pulse at [phone].  Routing to provider for final review. Patient is agreeable to disposition. Will close encounter.

## 2017-05-27 MED FILL — VIT D2 1.25 MG (50,000 UNIT: 1.25 MG | 84 days supply | Qty: 12 | Fill #1

## 2017-07-01 MED FILL — LEVOTHYROXINE 112 MCG TAB: 112 | 90 days supply | Qty: 90 | Fill #2

## 2017-07-21 MED FILL — LOSARTAN POTASSIUM 100 MG T: 100 | 90 days supply | Qty: 90 | Fill #1

## 2017-07-21 MED FILL — HYDROCHLOROTHIAZIDE 25 MG T: 25 | 90 days supply | Qty: 90 | Fill #1

## 2017-08-13 MED FILL — CYANOCOBALAMIN 1,000 MCG/ML: 1000 | 60 days supply | Qty: 2 | Fill #1

## 2017-08-13 MED FILL — ALL DAY ALLERGY 10 MG TAB: 10 | 100 days supply | Qty: 100 | Fill #2

## 2017-08-20 MED FILL — HYDROCODON-APAP 5-325: 5-325 | 3 days supply | Qty: 12 | Fill #0

## 2017-08-20 MED FILL — IBUPROFEN 800 MG TABS: 800 | 5 days supply | Qty: 20 | Fill #0

## 2017-08-20 MED FILL — AMOXICILLIN 500 MG CAPSULE: 500 | 7 days supply | Qty: 21 | Fill #0

## 2017-08-31 ENCOUNTER — Other Ambulatory Visit (INDEPENDENT_AMBULATORY_CARE_PROVIDER_SITE_OTHER): Payer: 59

## 2017-08-31 DIAGNOSIS — E559 Vitamin D deficiency, unspecified: Secondary | ICD-10-CM

## 2017-09-01 LAB — VITAMIN D 25 HYDROXY (VIT D DEFICIENCY, FRACTURES): Vit D, 25-Hydroxy: 24.4 ng/mL — ABNORMAL LOW (ref 30.0–100.0)

## 2017-09-03 ENCOUNTER — Telehealth: Payer: Self-pay

## 2017-09-03 NOTE — Telephone Encounter (Signed)
Left message to call Dana Debo at [phone]. 

## 2017-09-03 NOTE — Telephone Encounter (Signed)
-----   Message from Salvadore Dom, MD sent at 09/02/2017  5:13 PM EDT ----- Please let the patient know that her vit d has increased, but is still not in the normal range. Please have her increase to 5,000 IU a day and recheck her level in 3 months. Please check with the pharmacy, I'm not sure if the 5,000 IU is over the counter. Thanks

## 2017-09-08 NOTE — Telephone Encounter (Signed)
Left message to call Shilpa Bushee at [phone]. 

## 2017-09-16 ENCOUNTER — Other Ambulatory Visit: Payer: Self-pay | Admitting: Family Medicine

## 2017-09-16 DIAGNOSIS — Z1231 Encounter for screening mammogram for malignant neoplasm of breast: Secondary | ICD-10-CM

## 2017-09-17 NOTE — Telephone Encounter (Signed)
Left message to call Mikias Lanz at [phone].  

## 2017-09-21 ENCOUNTER — Encounter: Payer: Self-pay | Admitting: Family Medicine

## 2017-09-22 NOTE — Telephone Encounter (Signed)
Unable to reach patient x3. Letter to [place] for review.

## 2017-09-25 ENCOUNTER — Telehealth: Payer: Self-pay

## 2017-09-25 NOTE — Telephone Encounter (Signed)
Received PA request for Gardasil from [place].  Called and spoke to Janit Bern, Pharmacist, and he told me to disregard message for PA.

## 2017-10-07 ENCOUNTER — Ambulatory Visit: Payer: 59

## 2017-10-07 ENCOUNTER — Ambulatory Visit
Admission: RE | Admit: 2017-10-07 | Discharge: 2017-10-07 | Disposition: A | Discharge status: Home / Self Care | Payer: 59 | Source: Ambulatory Visit | Attending: Family Medicine | Admitting: Family Medicine

## 2017-10-07 DIAGNOSIS — Z1231 Encounter for screening mammogram for malignant neoplasm of breast: Secondary | ICD-10-CM

## 2017-10-21 MED FILL — IBUPROFEN 800 MG TABS: 800 | 5 days supply | Qty: 20 | Fill #1

## 2017-10-21 MED FILL — LOSARTAN POTASSIUM 100 MG T: 100 | 30 days supply | Qty: 30 | Fill #2

## 2017-10-21 MED FILL — HYDROCHLOROTHIAZIDE 25 MG T: 25 | 30 days supply | Qty: 30 | Fill #2

## 2017-10-28 DIAGNOSIS — E039 Hypothyroidism, unspecified: Secondary | ICD-10-CM | POA: Diagnosis not present

## 2017-11-04 ENCOUNTER — Encounter: Payer: Self-pay | Admitting: Family Medicine

## 2017-11-06 DIAGNOSIS — E039 Hypothyroidism, unspecified: Secondary | ICD-10-CM | POA: Diagnosis not present

## 2017-11-06 DIAGNOSIS — M2550 Pain in unspecified joint: Secondary | ICD-10-CM | POA: Diagnosis not present

## 2017-11-06 DIAGNOSIS — I10 Essential (primary) hypertension: Secondary | ICD-10-CM | POA: Diagnosis not present

## 2017-11-06 DIAGNOSIS — E041 Nontoxic single thyroid nodule: Secondary | ICD-10-CM | POA: Diagnosis not present

## 2017-11-06 MED FILL — NAPROXEN 500 MG TABLET: 500 | 30 days supply | Qty: 60 | Fill #0

## 2017-11-06 MED FILL — LEVOTHYROXINE 112 MCG TAB: 112 | 30 days supply | Qty: 30 | Fill #0

## 2017-11-16 MED FILL — LOSARTAN POTASSIUM 100 MG T: 100 | 30 days supply | Qty: 30 | Fill #3

## 2017-11-16 MED FILL — HYDROCHLOROTHIAZIDE 25 MG T: 25 | 30 days supply | Qty: 30 | Fill #3

## 2017-11-30 MED FILL — NAPROXEN 500 MG TABLET: 500 | 30 days supply | Qty: 60 | Fill #1

## 2017-11-30 MED FILL — LEVOTHYROXINE 112 MCG TAB: 112 | 90 days supply | Qty: 90 | Fill #1

## 2017-12-10 MED FILL — HYDROCHLOROTHIAZIDE 25 MG T: 25 | 90 days supply | Qty: 90 | Fill #4

## 2017-12-10 MED FILL — LOSARTAN POTASSIUM 100 MG T: 100 | 90 days supply | Qty: 90 | Fill #4

## 2018-01-06 MED FILL — NAPROXEN 500 MG TABLET: 500 | 30 days supply | Qty: 60 | Fill #2

## 2018-01-09 ENCOUNTER — Encounter: Payer: Self-pay | Admitting: Nurse Practitioner

## 2018-01-09 ENCOUNTER — Ambulatory Visit: Payer: Self-pay | Admitting: Nurse Practitioner

## 2018-01-09 VITALS — BP 122/74 | HR 74 | Temp 98.3°F | Wt 259.8 lb

## 2018-01-09 DIAGNOSIS — J069 Acute upper respiratory infection, unspecified: Secondary | ICD-10-CM

## 2018-01-09 MED ORDER — PREDNISONE 20 MG PO TABS: 20.0000 mg | ORAL_TABLET | Freq: Every day | ORAL | 0 refills | Status: DC

## 2018-01-09 NOTE — Patient Instructions (Addendum)

## 2018-01-09 NOTE — Progress Notes (Signed)
Subjective:  Pam Mckee is a 47 y.o. female who presents for evaluation of URI like symptoms.  Symptoms include chills, non productive cough, sore throat and headache.  Onset of symptoms was 1 day ago, and has been gradually worsening since that time.  Treatment to date:  none.  High risk factors for influenza complications:  none.  The following portions of the patient's history were reviewed and updated as appropriate:  allergies, current medications and past medical history.  Constitutional: positive for anorexia, chills, fatigue and suspected fevers, negative for malaise, sweats and weight loss Eyes: negative Ears, nose, mouth, throat, and face: positive for nasal congestion and sore throat, negative for ear drainage, earaches and hoarseness Respiratory: positive for cough, negative for asthma, sputum, stridor and wheezing Cardiovascular: negative Gastrointestinal: positive for decreased appetite, negative for abdominal pain, change in bowel habits, constipation, diarrhea, nausea and vomiting Neurological: positive for headaches, negative for coordination problems, dizziness, vertigo and weakness Allergic/Immunologic: negative Objective:  BP 122/74   Pulse 74   Temp 98.3 F (36.8 C)   Wt 259 lb 12.8 oz (117.8 kg)   SpO2 99%   BMI 42.25 kg/m  General appearance: alert, cooperative and no distress Head: Normocephalic, without obvious abnormality, atraumatic Eyes: conjunctivae/corneas clear. PERRL, EOM's intact. Fundi benign. Ears: abnormal TM left ear - erythematous and no bulging of TM Nose: no discharge, turbinates swollen, inflamed, no sinus tenderness Throat: abnormal findings: mild oropharyngeal erythema Lungs: clear to auscultation bilaterally Heart: regular rate and rhythm, S1, S2 normal, no murmur, click, rub or gallop Abdomen: soft, non-tender; bowel sounds normal; no masses,  no organomegaly Pulses: 2+ and symmetric Skin: Skin color, texture, turgor normal. No rashes or  lesions Lymph nodes: cervical and submandibular nodes normal Neurologic: Grossly normal    Assessment:  viral upper respiratory illness    Plan:  Discussed diagnosis and treatment of URI. Discussed the importance of avoiding unnecessary antibiotic therapy. Educational material distributed and questions answered. Suggested symptomatic OTC remedies. Supportive care with appropriate antipyretics and fluids. Prednisone and OTC Robitussin per orders. Follow up as needed.

## 2018-02-08 ENCOUNTER — Other Ambulatory Visit: Payer: Self-pay | Admitting: Family Medicine

## 2018-02-19 ENCOUNTER — Encounter: Payer: 59 | Attending: Family Medicine | Admitting: Registered"

## 2018-02-19 ENCOUNTER — Encounter: Payer: Self-pay | Admitting: Registered"

## 2018-02-19 DIAGNOSIS — Z713 Dietary counseling and surveillance: Secondary | ICD-10-CM | POA: Insufficient documentation

## 2018-02-19 DIAGNOSIS — E669 Obesity, unspecified: Secondary | ICD-10-CM | POA: Diagnosis not present

## 2018-02-19 NOTE — Patient Instructions (Signed)
Goals: Walking 10 min before and after work, 5x days week Rethink your drink Use the handout for ideas for iron-rich foods

## 2018-02-19 NOTE — Progress Notes (Signed)
Medical Nutrition Therapy:  Appt start time: 0910 end time:  1010.  [facility] Employee Visit 1  Assessment:  Primary concerns today: Patient states she has a goal of getting more active and eating better. Patient states in 2017 she was on a good routine of walking before and after work when her coworkers were participating with her. Patient states those co-workers are no longer at her office and she is trying to create an atomosphere of healthy eating and activity with the new employees. Patient states she has initiated a "squat challenge" and co-workers are participating in that doing min 10 squats per day when they have a lull in work.   Patient states she does pretty well when eating at home with following MyPlate guidelines and using a smaller plate. Patient states she doesn't do as well when not at home.   Patient lost weight in 2017 but gained much of it back in 2018. Patient states her weight loss goal for 2019 is 25 lb weight loss by June and another 25 lb loss by December for a total of 50 lb.  Patient states she has pretty good energy while at work, but gets tired after she gets home. Sometimes watches TV and eats a snack (cheese doodles) while relaxing. Patient states she doesn't always like to wear CPAP because of what she looks like with it on. Pt reports her boyfriend encourages her to wear it.  Other relevant PMH: Hypothyroid, Vit D deficiency, low iron, low vit B12, sleep apnea. Pt states she will be getting new labs before her next appointment here.  Preferred Learning Style:   No preference indicated   Learning Readiness:   Ready   MEDICATIONS: reviewed   DIETARY INTAKE:  24-hr recall:  B ( AM): iced coffee OR iced coffee, egg & cheese  Snk ( AM): none OR nuts OR small choc candy L ( PM): sandwich, salad or soup (pays attention to calories, but not counting) Snk ( PM): cheese doodles while cooking dinner (stress?) D ( PM): rice, chicken or pork, fresh spinach Snk ( PM):  none OR chips OR applesauce, pears OR pudding Beverages: water, med dunkin[employer] donuts iced coffee ice coffee = 35g sugar, 6 oz apple juice 3x week with dinner, occassional 10-12 oz ginger ale, ~2x week [place] lemonade (buys when on sale)  Usual physical activity: walks dog 2x day  Estimated energy needs: 1600 calories 180 g carbohydrates 100 g protein 53 g fat  Progress Towards Goal(s): initial goal     Intervention:  Nutrition Education. Discussed beverage choices and contribution to sugar in the diet. Discussed iron, b-12 and vit d role in energy. Discussed non-hunger eating.  Teaching Method Utilized:  Visual Auditory  Handouts given during visit include:  Iron food sources, (2 handouts: AND & Plant Based)  Barriers to learning/adherence to lifestyle change: none  Demonstrated degree of understanding via:  Teach Back   Monitoring/Evaluation:  Dietary intake, exercise, relevant labs, and body weight in 1 month(s).

## 2018-03-08 ENCOUNTER — Encounter: Payer: Self-pay | Admitting: Family Medicine

## 2018-03-08 ENCOUNTER — Other Ambulatory Visit: Payer: Self-pay

## 2018-03-08 ENCOUNTER — Ambulatory Visit (INDEPENDENT_AMBULATORY_CARE_PROVIDER_SITE_OTHER): Payer: 59 | Admitting: Family Medicine

## 2018-03-08 VITALS — BP 126/84 | HR 88 | Temp 98.5°F | Resp 18 | Ht 65.75 in | Wt 265.0 lb

## 2018-03-08 DIAGNOSIS — Z5181 Encounter for therapeutic drug level monitoring: Secondary | ICD-10-CM | POA: Diagnosis not present

## 2018-03-08 DIAGNOSIS — Z1383 Encounter for screening for respiratory disorder NEC: Secondary | ICD-10-CM | POA: Diagnosis not present

## 2018-03-08 DIAGNOSIS — E02 Subclinical iodine-deficiency hypothyroidism: Secondary | ICD-10-CM

## 2018-03-08 DIAGNOSIS — Z1389 Encounter for screening for other disorder: Secondary | ICD-10-CM | POA: Diagnosis not present

## 2018-03-08 DIAGNOSIS — G4733 Obstructive sleep apnea (adult) (pediatric): Secondary | ICD-10-CM | POA: Diagnosis not present

## 2018-03-08 DIAGNOSIS — E538 Deficiency of other specified B group vitamins: Secondary | ICD-10-CM

## 2018-03-08 DIAGNOSIS — Z0000 Encounter for general adult medical examination without abnormal findings: Secondary | ICD-10-CM

## 2018-03-08 DIAGNOSIS — Z136 Encounter for screening for cardiovascular disorders: Secondary | ICD-10-CM | POA: Diagnosis not present

## 2018-03-08 DIAGNOSIS — I10 Essential (primary) hypertension: Secondary | ICD-10-CM | POA: Diagnosis not present

## 2018-03-08 DIAGNOSIS — D500 Iron deficiency anemia secondary to blood loss (chronic): Secondary | ICD-10-CM | POA: Diagnosis not present

## 2018-03-08 DIAGNOSIS — H5213 Myopia, bilateral: Secondary | ICD-10-CM | POA: Diagnosis not present

## 2018-03-08 DIAGNOSIS — Z8639 Personal history of other endocrine, nutritional and metabolic disease: Secondary | ICD-10-CM | POA: Diagnosis not present

## 2018-03-08 DIAGNOSIS — E559 Vitamin D deficiency, unspecified: Secondary | ICD-10-CM

## 2018-03-08 DIAGNOSIS — Z1329 Encounter for screening for other suspected endocrine disorder: Secondary | ICD-10-CM | POA: Diagnosis not present

## 2018-03-08 DIAGNOSIS — Z1231 Encounter for screening mammogram for malignant neoplasm of breast: Secondary | ICD-10-CM

## 2018-03-08 DIAGNOSIS — Z130 Encounter for screening for diseases of the blood and blood-forming organs and certain disorders involving the immune mechanism: Secondary | ICD-10-CM | POA: Diagnosis not present

## 2018-03-08 LAB — POCT URINALYSIS DIP (MANUAL ENTRY)
Bilirubin, UA: NEGATIVE
Blood, UA: NEGATIVE
Glucose, UA: NEGATIVE mg/dL
Ketones, POC UA: NEGATIVE mg/dL
Leukocytes, UA: NEGATIVE
Nitrite, UA: NEGATIVE
Protein Ur, POC: NEGATIVE mg/dL
Spec Grav, UA: 1.02 (ref 1.010–1.025)
Urobilinogen, UA: 0.2 E.U./dL
pH, UA: 6.5 (ref 5.0–8.0)

## 2018-03-08 MED ORDER — NAPROXEN 500 MG PO TABS: ORAL_TABLET | ORAL | 2 refills | Status: DC

## 2018-03-08 MED ORDER — HYDROCHLOROTHIAZIDE 25 MG PO TABS: 25.0000 mg | ORAL_TABLET | Freq: Every day | ORAL | 3 refills | Status: DC

## 2018-03-08 MED ORDER — LOSARTAN POTASSIUM 100 MG PO TABS: 100.0000 mg | ORAL_TABLET | Freq: Every day | ORAL | 3 refills | Status: DC

## 2018-03-08 MED ORDER — PHENTERMINE HCL 15 MG PO CAPS: 15.0000 mg | ORAL_CAPSULE | ORAL | 0 refills | Status: DC

## 2018-03-08 MED FILL — PHENTERMINE 15 MG CAPSULE: 15 | 30 days supply | Qty: 60 | Fill #0

## 2018-03-08 MED FILL — HYDROCHLOROTHIAZIDE 25 MG T: 25 | 90 days supply | Qty: 90 | Fill #0

## 2018-03-08 MED FILL — LOSARTAN POTASSIUM 100 MG T: 100 | 90 days supply | Qty: 90 | Fill #0

## 2018-03-08 NOTE — Patient Instructions (Addendum)
   IF you received an x-ray today, you will receive an invoice from [facility]. Please contact [facility] at [phone] with questions or concerns regarding your invoice.   IF you received labwork today, you will receive an invoice from [facility]. Please contact [facility] at [phone] with questions or concerns regarding your invoice.   Our billing staff will not be able to assist you with questions regarding bills from these companies.  You will be contacted with the lab results as soon as they are available. The fastest way to get your results is to activate your My Chart account. Instructions are located on the last page of this paperwork. If you have not heard from us regarding the results in 2 weeks, please contact this office.     Exercising to Lose Weight Exercising can help you to lose weight. In order to lose weight through exercise, you need to do vigorous-intensity exercise. You can tell that you are exercising with vigorous intensity if you are breathing very hard and fast and cannot hold a conversation while exercising. Moderate-intensity exercise helps to maintain your current weight. You can tell that you are exercising at a moderate level if you have a higher heart rate and faster breathing, but you are still able to hold a conversation. How often should I exercise? Choose an activity that you enjoy and set realistic goals. Your health care provider can help you to make an activity plan that works for you. Exercise regularly as directed by your health care provider. This may include:  Doing resistance training twice each week, such as: ? Push-ups. ? Sit-ups. ? Lifting weights. ? Using resistance bands.  Doing a given intensity of exercise for a given amount of time. Choose from these options: ? 150 minutes of moderate-intensity exercise every week. ? 75 minutes of vigorous-intensity exercise every week. ? A mix of moderate-intensity and  vigorous-intensity exercise every week.  Children, pregnant women, people who are out of shape, people who are overweight, and older adults may need to consult a health care provider for individual recommendations. If you have any sort of medical condition, be sure to consult your health care provider before starting a new exercise program. What are some activities that can help me to lose weight?  Walking at a rate of at least 4.5 miles an hour.  Jogging or running at a rate of 5 miles per hour.  Biking at a rate of at least 10 miles per hour.  Lap swimming.  Roller-skating or in-line skating.  Cross-country skiing.  Vigorous competitive sports, such as football, basketball, and soccer.  Jumping rope.  Aerobic dancing. How can I be more active in my day-to-day activities?  Use the stairs instead of the elevator.  Take a walk during your lunch break.  If you drive, park your car farther away from work or school.  If you take public transportation, get off one stop early and walk the rest of the way.  Make all of your phone calls while standing up and walking around.  Get up, stretch, and walk around every 30 minutes throughout the day. What guidelines should I follow while exercising?  Do not exercise so much that you hurt yourself, feel dizzy, or get very short of breath.  Consult your health care provider prior to starting a new exercise program.  Wear comfortable clothes and shoes with good support.  Drink plenty of water while you exercise to prevent dehydration or heat stroke. Body water is   lost during exercise and must be replaced.  Work out until you breathe faster and your heart beats faster. This information is not intended to replace advice given to you by your health care provider. Make sure you discuss any questions you have with your health care provider. Document Released: 12/27/2010 Document Revised: 05/01/2016 Document Reviewed: 04/27/2014 Elsevier  Interactive Patient Education  2018 [place] Maintenance, Female Adopting a healthy lifestyle and getting preventive care can go a long way to promote health and wellness. Talk with your health care provider about what schedule of regular examinations is right for you. This is a good chance for you to check in with your provider about disease prevention and staying healthy. In between checkups, there are plenty of things you can do on your own. Experts have done a lot of research about which lifestyle changes and preventive measures are most likely to keep you healthy. Ask your health care provider for more information. Weight and diet Eat a healthy diet  Be sure to include plenty of vegetables, fruits, low-fat dairy products, and lean protein.  Do not eat a lot of foods high in solid fats, added sugars, or salt.  Get regular exercise. This is one of the most important things you can do for your health. ? Most adults should exercise for at least 150 minutes each week. The exercise should increase your heart rate and make you sweat (moderate-intensity exercise). ? Most adults should also do strengthening exercises at least twice a week. This is in addition to the moderate-intensity exercise.  Maintain a healthy weight  Body mass index (BMI) is a measurement that can be used to identify possible weight problems. It estimates body fat based on height and weight. Your health care provider can help determine your BMI and help you achieve or maintain a healthy weight.  For females 46 years of age and older: ? A BMI below 18.5 is considered underweight. ? A BMI of 18.5 to 24.9 is normal. ? A BMI of 25 to 29.9 is considered overweight. ? A BMI of 30 and above is considered obese.  Watch levels of cholesterol and blood lipids  You should start having your blood tested for lipids and cholesterol at 46 years of age, then have this test every 5 years.  You may need to have your  cholesterol levels checked more often if: ? Your lipid or cholesterol levels are high. ? You are older than 46 years of age. ? You are at high risk for heart disease.  Cancer screening Lung Cancer  Lung cancer screening is recommended for adults 8-10 years old who are at high risk for lung cancer because of a history of smoking.  A yearly low-dose CT scan of the lungs is recommended for people who: ? Currently smoke. ? Have quit within the past 15 years. ? Have at least a 30-pack-year history of smoking. A pack year is smoking an average of one pack of cigarettes a day for 1 year.  Yearly screening should continue until it has been 15 years since you quit.  Yearly screening should stop if you develop a health problem that would prevent you from having lung cancer treatment.  Breast Cancer  Practice breast self-awareness. This means understanding how your breasts normally appear and feel.  It also means doing regular breast self-exams. Let your health care provider know about any changes, no matter how small.  If you are in your 20s or 30s, you should have  a clinical breast exam (CBE) by a health care provider every 1-3 years as part of a regular health exam.  If you are 5 or older, have a CBE every year. Also consider having a breast X-ray (mammogram) every year.  If you have a family history of breast cancer, talk to your health care provider about genetic screening.  If you are at high risk for breast cancer, talk to your health care provider about having an MRI and a mammogram every year.  Breast cancer gene (BRCA) assessment is recommended for women who have family members with BRCA-related cancers. BRCA-related cancers include: ? Breast. ? Ovarian. ? Tubal. ? Peritoneal cancers.  Results of the assessment will determine the need for genetic counseling and BRCA1 and BRCA2 testing.  Cervical Cancer Your health care provider may recommend that you be screened regularly  for cancer of the pelvic organs (ovaries, uterus, and vagina). This screening involves a pelvic examination, including checking for microscopic changes to the surface of your cervix (Pap test). You may be encouraged to have this screening done every 3 years, beginning at age 5.  For women ages 19-65, health care providers may recommend pelvic exams and Pap testing every 3 years, or they may recommend the Pap and pelvic exam, combined with testing for human papilloma virus (HPV), every 5 years. Some types of HPV increase your risk of cervical cancer. Testing for HPV may also be done on women of any age with unclear Pap test results.  Other health care providers may not recommend any screening for nonpregnant women who are considered low risk for pelvic cancer and who do not have symptoms. Ask your health care provider if a screening pelvic exam is right for you.  If you have had past treatment for cervical cancer or a condition that could lead to cancer, you need Pap tests and screening for cancer for at least 20 years after your treatment. If Pap tests have been discontinued, your risk factors (such as having a new sexual partner) need to be reassessed to determine if screening should resume. Some women have medical problems that increase the chance of getting cervical cancer. In these cases, your health care provider may recommend more frequent screening and Pap tests.  Colorectal Cancer  This type of cancer can be detected and often prevented.  Routine colorectal cancer screening usually begins at 46 years of age and continues through 46 years of age.  Your health care provider may recommend screening at an earlier age if you have risk factors for colon cancer.  Your health care provider may also recommend using home test kits to check for hidden blood in the stool.  A small camera at the end of a tube can be used to examine your colon directly (sigmoidoscopy or colonoscopy). This is done to  check for the earliest forms of colorectal cancer.  Routine screening usually begins at age 39.  Direct examination of the colon should be repeated every 5-10 years through 46 years of age. However, you may need to be screened more often if early forms of precancerous polyps or small growths are found.  Skin Cancer  Check your skin from head to toe regularly.  Tell your health care provider about any new moles or changes in moles, especially if there is a change in a mole's shape or color.  Also tell your health care provider if you have a mole that is larger than the size of a pencil eraser.  Always use  sunscreen. Apply sunscreen liberally and repeatedly throughout the day.  Protect yourself by wearing long sleeves, pants, a wide-brimmed hat, and sunglasses whenever you are outside.  Heart disease, diabetes, and high blood pressure  High blood pressure causes heart disease and increases the risk of stroke. High blood pressure is more likely to develop in: ? People who have blood pressure in the high end of the normal range (130-139/85-89 mm Hg). ? People who are overweight or obese. ? People who are African American.  If you are 28-61 years of age, have your blood pressure checked every 3-5 years. If you are 70 years of age or older, have your blood pressure checked every year. You should have your blood pressure measured twice-once when you are at a hospital or clinic, and once when you are not at a hospital or clinic. Record the average of the two measurements. To check your blood pressure when you are not at a hospital or clinic, you can use: ? An automated blood pressure machine at a pharmacy. ? A home blood pressure monitor.  If you are between 48 years and 23 years old, ask your health care provider if you should take aspirin to prevent strokes.  Have regular diabetes screenings. This involves taking a blood sample to check your fasting blood sugar level. ? If you are at a  normal weight and have a low risk for diabetes, have this test once every three years after 46 years of age. ? If you are overweight and have a high risk for diabetes, consider being tested at a younger age or more often. Preventing infection Hepatitis B  If you have a higher risk for hepatitis B, you should be screened for this virus. You are considered at high risk for hepatitis B if: ? You were born in a country where hepatitis B is common. Ask your health care provider which countries are considered high risk. ? Your parents were born in a high-risk country, and you have not been immunized against hepatitis B (hepatitis B vaccine). ? You have HIV or AIDS. ? You use needles to inject street drugs. ? You live with someone who has hepatitis B. ? You have had sex with someone who has hepatitis B. ? You get hemodialysis treatment. ? You take certain medicines for conditions, including cancer, organ transplantation, and autoimmune conditions.  Hepatitis C  Blood testing is recommended for: ? Everyone born from [birth year] through 1964. ? Anyone with known risk factors for hepatitis C.  Sexually transmitted infections (STIs)  You should be screened for sexually transmitted infections (STIs) including gonorrhea and chlamydia if: ? You are sexually active and are younger than 46 years of age. ? You are older than 46 years of age and your health care provider tells you that you are at risk for this type of infection. ? Your sexual activity has changed since you were last screened and you are at an increased risk for chlamydia or gonorrhea. Ask your health care provider if you are at risk.  If you do not have HIV, but are at risk, it may be recommended that you take a prescription medicine daily to prevent HIV infection. This is called pre-exposure prophylaxis (PrEP). You are considered at risk if: ? You are sexually active and do not regularly use condoms or know the HIV status of your  partner(s). ? You take drugs by injection. ? You are sexually active with a partner who has HIV.  Talk with your health  care provider about whether you are at high risk of being infected with HIV. If you choose to begin PrEP, you should first be tested for HIV. You should then be tested every 3 months for as long as you are taking PrEP. Pregnancy  If you are premenopausal and you may become pregnant, ask your health care provider about preconception counseling.  If you may become pregnant, take 400 to 800 micrograms (mcg) of folic acid every day.  If you want to prevent pregnancy, talk to your health care provider about birth control (contraception). Osteoporosis and menopause  Osteoporosis is a disease in which the bones lose minerals and strength with aging. This can result in serious bone fractures. Your risk for osteoporosis can be identified using a bone density scan.  If you are 84 years of age or older, or if you are at risk for osteoporosis and fractures, ask your health care provider if you should be screened.  Ask your health care provider whether you should take a calcium or vitamin D supplement to lower your risk for osteoporosis.  Menopause may have certain physical symptoms and risks.  Hormone replacement therapy may reduce some of these symptoms and risks. Talk to your health care provider about whether hormone replacement therapy is right for you. Follow these instructions at home:  Schedule regular health, dental, and eye exams.  Stay current with your immunizations.  Do not use any tobacco products including cigarettes, chewing tobacco, or electronic cigarettes.  If you are pregnant, do not drink alcohol.  If you are breastfeeding, limit how much and how often you drink alcohol.  Limit alcohol intake to no more than 1 drink per day for nonpregnant women. One drink equals 12 ounces of beer, 5 ounces of wine, or 1 ounces of hard liquor.  Do not use street  drugs.  Do not share needles.  Ask your health care provider for help if you need support or information about quitting drugs.  Tell your health care provider if you often feel depressed.  Tell your health care provider if you have ever been abused or do not feel safe at home. This information is not intended to replace advice given to you by your health care provider. Make sure you discuss any questions you have with your health care provider. Document Released: 06/09/2011 Document Revised: 05/01/2016 Document Reviewed: 08/28/2015 Elsevier Interactive Patient Education  [employer].

## 2018-03-08 NOTE — Progress Notes (Signed)
Subjective:  By signing my name below, I, Pam Mckee, attest that this documentation has been prepared under the direction and in the presence of Pam Cheadle, MD. Electronically Signed: Moises Mckee, [place]. 03/08/2018 , 9:59 AM .  Patient was seen in Room 1 .   Patient ID: Pam Mckee, female    DOB: 1971-06-21, 47 y.o.   MRN: [id number] Chief Complaint  Patient presents with  . Annual Exam    No pap needed. Pt states she goes to GYN.  . Medication Refill    Miralax, All Meds   HPI Pam Mckee is a delightful 47 yo woman here today for her complete physical. Patient was last seen 1 year prior for review of her HTN.  Primary Preventative Screenings: Cervical cancer/STI: seen by gynecologist, Dr. Talbert Pam at [facility] (in [place]) in June 2018.  Family Planning: Has mirena IUD in place by Dr. Talbert Pam. Breast Cancer: mammogram done 10/07/17.  Cardiac: Last EKG done 11/2016 which was normal.  Weight/Mckee sugar/Diet/Exercise: She was seen 2 weeks ago by [facility] nutrition Levie Heritage for dietary advice on obesity. She was advised to start walking before and after work 5 times a week, change her drinks to no calories, and increase diets with iron-rich foods and pt reports great success in doing all of these things.  On her walks, she is only getting ~10 minutes a day but she is doing them!!     BMI Readings from Last 3 Encounters:  01/09/18 42.25 kg/m  05/21/17 40.53 kg/m  04/02/17 39.63 kg/m   Lab Results  Component Value Date   HGBA1C 5.4 05/21/2017   OTC/Vit/Supp/Herbal: She's been taking prenatal gummies, which includes 800 units of vitamin D, 2.8mcg of B-12, 3.52mcg zinc, biotin and B-6. She was previously on phentermine, which patient informs gave her a boost. She also lost about 20-25 lbs, took for about 2 months. She had headaches initially, and took just half tablets. She also had high BP at that time. She hasn't taken the medication lately and took about a  year to regain the weight back.  Dentist/Optho: Immunizations:  Immunization History  Administered Date(s) Administered  . Hepatitis B 12/09/2011, 11/07/2012, 05/08/2013  . Influenza Split 09/07/2014  . Influenza, Seasonal, Injecte, Preservative Fre 09/03/2015  . Influenza,inj,Quad PF,6+ Mos 08/25/2016  . Tdap 12/09/2011    Chronic Medical Conditions: Hypothyroidism: followed by Dr. Chalmers Cater; next appointment in Nov. She is still taking levothyroxine 19mcg.  Vitamin D deficiency: 6 months prior, levels were still low so increased to 5000 units daily but she hasn't been taking her Vitamin D in past several mos; never had a normal level. She had better compliance with weekly Vitamin D supplement than the otc daily so would like restart that. HTN: missed medication yesterday due to driving up to [name]. She misses medication only ~once a year. Normally excellent compliance. Checks her BP at work - everyone does it on break. B-12: it was low normal, did well on the B-12 injections - noted symptomatic improvement.  H/o Iron deficiency: was low normal prior, increasing iron in diet. H/o HLD:  as well as ferritin; lipids had dramatically improved with LDL at 116.  Obesity: Was on phentermine many years previously (>44yrs) from Dr. Jeanie Cooks. She lost sig weight (20-25 lbs) in just 2 months but then plateaued for sev mos after that so stopped taking.  She started gaining the wait back after 9-12 mos off of the phentermine when an event caused her a lot  of stress so she stopped trying.  Did not have any problems tolerating the phentermine at all - no CP/palp/HTN/anxiety/insomnia, etc. BP was well controlled on it though reports she was on BP meds already at that time. She has never tried any other meds for weight loss. Never been on wellbutrin. She plans to see dietician, Levie Heritage, again with appointment on April 12th. She's been walking daily at least 10 minutes, but still not as active as she would  like to be. Since her visit w/ nutrition 2 weeks ago, she hasn't had a Coke or gone to [employer], both of which she used to do daily. She has tried the [employer] occasionally before and looked into [facility] spin class but nothing lately though she does think this is something that she can and will enjoy incorporating again.  Past Medical History:  Diagnosis Date  . Anemia   . Hypertension   . OSA on CPAP   . Seizures ([employer])   . Thyroid disease    hypo   Past Surgical History:  Procedure Laterality Date  . INTRAUTERINE DEVICE (IUD) INSERTION  06/2016   Current Outpatient Medications on File Prior to Visit  Medication Sig Dispense Refill  . ALL DAY ALLERGY 10 MG tablet TAKE 1 TABLET BY MOUTH AT BEDTIME. 30 tablet 11  . cyanocobalamin (,VITAMIN B-12,) 1000 MCG/ML injection Inject 1 mL (1,000 mcg total) into the muscle every 30 (thirty) days. 5 mL 0  . hydrochlorothiazide (HYDRODIURIL) 25 MG tablet Take 1 tablet (25 mg total) by mouth daily. 90 tablet 3  . levonorgestrel (MIRENA) 20 MCG/24HR IUD 1 each by Intrauterine route once.    Marland Kitchen levothyroxine (SYNTHROID, LEVOTHROID) 112 MCG tablet Take 1 tablet by mouth daily.  4  . losartan (COZAAR) 100 MG tablet Take 1 tablet (100 mg total) by mouth daily. 90 tablet 3  . Prenatal MV-Min-FA-Omega-3 (PRENATAL GUMMIES/DHA & FA PO) Take by mouth.     Current Facility-Administered Medications on File Prior to Visit  Medication Dose Route Frequency Provider Last Rate Last Dose  . cyanocobalamin ((VITAMIN B-12)) injection 1,000 mcg  1,000 mcg Intramuscular Q30 days Shawnee Knapp, MD   1,000 mcg at 09/09/16 4174   No Known Allergies Family History  Problem Relation Age of Onset  . Hypertension Mother   . Hypertension Brother   . Aneurysm Brother   . Diabetes Paternal Grandfather   . Renal Disease Father    Social History   Socioeconomic History  . Marital status: Single    Spouse name: Not on file  . Number of children: 1  . Years of  education: HS  . Highest education level: Not on file  Occupational History  . Occupation: Medical illustrator: [employer]  . Financial resource strain: Not on file  . Food insecurity:    Worry: Not on file    Inability: Not on file  . Transportation needs:    Medical: Not on file    Non-medical: Not on file  Tobacco Use  . Smoking status: Never Smoker  . Smokeless tobacco: Never Used  Substance and Sexual Activity  . Alcohol use: No    Alcohol/week: 0.0 oz  . Drug use: No  . Sexual activity: Yes    Partners: Male    Birth control/protection: Condom, IUD  Lifestyle  . Physical activity:    Days per week: Not on file    Minutes per session: Not on file  .  Stress: Not on file  Relationships  . Social connections:    Talks on phone: Not on file    Gets together: Not on file    Attends religious service: Not on file    Active member of club or organization: Not on file    Attends meetings of clubs or organizations: Not on file    Relationship status: Not on file  Other Topics Concern  . Not on file  Social History Narrative   Single.Exercise 3 times/week Zumba for 1 hour   Drinks about 2-3 cups of coffee/tea a day. Education: [employer].   Depression screen [name] 2/9 02/19/2018 04/02/2017 11/24/2016 09/09/2016 05/17/2016  Decreased Interest 0 0 0 0 0  Down, Depressed, Hopeless 0 0 0 0 0  PHQ - 2 Score 0 0 0 0 0    Review of Systems  Constitutional: Negative for chills, fatigue, fever and unexpected weight change.  Respiratory: Negative for cough.   Gastrointestinal: Negative for constipation, diarrhea, nausea and vomiting.  Skin: Negative for rash and wound.  Neurological: Negative for dizziness, weakness and headaches.  All other systems reviewed and are negative.      Objective:   Physical Exam  Constitutional: She is oriented to person, place, and time. She appears well-developed and well-nourished. No distress.  HENT:  Head:  Normocephalic and atraumatic.  Right Ear: External ear normal.  Left Ear: External ear normal.  Nose: Nose normal.  Mouth/Throat: Oropharynx is clear and moist.  Eyes: Pupils are equal, round, and reactive to light. EOM are normal.  Neck: Neck supple. Thyromegaly (markeldly enlarged throughout) present.  Cardiovascular: Normal rate, regular rhythm and normal heart sounds.  Pulmonary/Chest: Effort normal and breath sounds normal. No respiratory distress. She has no wheezes.  Abdominal: Bowel sounds are normal.  Musculoskeletal: Normal range of motion.  Neurological: She is alert and oriented to person, place, and time.  Skin: Skin is warm and dry.  Psychiatric: She has a normal mood and affect. Her behavior is normal.  Nursing note and vitals reviewed.   BP 126/84 (BP Location: Left Arm, Patient Position: Sitting, Cuff Size: Large)   Pulse 88   Temp 98.5 F (36.9 C) (Oral)   Resp 18   Ht 5' 5.75" (1.67 m)   Wt 265 lb (120.2 kg)   SpO2 98%   BMI 43.10 kg/m    Visual Acuity Screening   Right eye Left eye Both eyes  Without correction:     With correction: 20/20 20/20 20/20        Assessment & Plan:   1. Annual physical exam - sees Dr. Talbert Pam annually 07/2017 for pelvic.  2. Screening for cardiovascular, respiratory, and genitourinary diseases   3. Encounter for screening mammogram for breast cancer - UTD as of 09/2017  4. Screening for deficiency anemia   5. Screening for thyroid disorder   6. Essential hypertension - great today and didn't take meds, cont hctz 25 and losartan 100.  7. Obstructive sleep apnea   8. Severe obesity (BMI >= 40) (HCC) - pt did well on phenteramine prior and would like to restart. Reviewed risks & drawback of weight-loss med and average short/long term results. Reviewed other weight loss med options, +/- of those. Decided to retry phenteramine due to her positive memory of prior use and sig less comparitive cost even if not covered by ins. Reivewed  essential need to exercise - aerobic & strength, good sleep, and clean controlled diet all needed to have any sig  success and have it sustain. Pt has already started seeing excellent [facility] nutritionist - Levie Heritage - for a visit sev wks ago and plans to cont for several more sessions - make sure she is getting in freq high protein snacks, avoid carbs/large meals. Requested pt to add in at least once weekly aerobic class to her daily 10 min walk regimen and will have her build it up from there - she agrees to join a [employer] zumba or spin class this week.  Recheck in 1 -2 mos - needs to be ON the phenteramine at the day/time of her f/u OV as want to check her vitals on it.  Need weight check to ensure it is effective as well before refilling. If she is doing well, will gradually lengthen time between visits but usu do not sustain beyond 6 mos - stop sooner if weight plataues.  9. Vitamin D deficiency - has been on 800 iu/d but states to very poor vitamin compliance but stellar rx med compliance - wants to resume rx high dose supp so she can put in to just one day in her wkly med box.  When she did prior 6 mo once wkly high dose courses, it would increase her 25-OH D level by 10 ng/mL max but she has still never been in the normal range - all to low - as starts dropping again when stops the rx. Will have pt increase 50K rx to twice weekly x 6 mios - recheck D levels towards end of this course and then likely transition pt back to 50K once a wk which she wants to continue indefintely - although sig above max rec dose, I think this should be fine as long as we check her D levels every 6 mos for the first sev yrs at least. Any s/sxs of hypercalcemia, have her stop D immed while labs P.  10. Medication monitoring encounter   11. History of elevated lipids - more elevated today w/ LDL 134 - baseline LDL 110s.  34yr ASCVD risk using today's info is 2.9% with optimal risk 0.6% and lifetime risk 39%.  When lipids last  checked 10 mos prior, they were sig better as LDL was 18 mg/dL lower with trig and HDL equivalent and at goal, ASCVD risk was 1.9% at that time.  12. Subclinical iodine-deficiency hypothyroidism - follows w/ Dr. Chalmers Cater  13. Vitamin B12 deficiency - reports she did well on prior trial of IM replacement prior (given 10/17, 4/18, 5/18). Last B23 level was 6/18 - 1 month after the 2 monthly IM cyanocobalamin 1g injections and was in the lower half of the normal range - same place where it is on today's labs. Taking daily prenatal gummy mvi w/ microscopic dose (2.8 vs nml supp 500-10000 mcg).  Rec try increasing dose of daily B complex supp.  14. Iron deficiency anemia due to chronic Mckee loss - should be resolving now that menses have finally stopped w/ Mirena IUD, doing high iron diet.  Ferritin level today great 36 ->82 so few more months of high iron die then back to normal. No supp needed    Orders Placed This Encounter  Procedures  . Comprehensive metabolic panel    Order Specific Question:   Has the patient fasted?    Answer:   Yes  . CBC with Differential/Platelet  . VITAMIN D 25 Hydroxy (Vit-D Deficiency, Fractures)  . Vitamin B12  . Ferritin  . Hemoglobin A1c  . Lipid panel  Order Specific Question:   Has the patient fasted?    Answer:   Yes  . POCT urinalysis dipstick    Meds ordered this encounter  Medications  . phentermine 15 MG capsule    Sig: Take 1 capsule (15 mg total) by mouth every morning. May repeat x 1 at noon if needed    Dispense:  60 capsule    Refill:  0  . losartan (COZAAR) 100 MG tablet    Sig: Take 1 tablet (100 mg total) by mouth daily.    Dispense:  90 tablet    Refill:  3  . hydrochlorothiazide (HYDRODIURIL) 25 MG tablet    Sig: Take 1 tablet (25 mg total) by mouth daily.    Dispense:  90 tablet    Refill:  3  . naproxen (NAPROSYN) 500 MG tablet    Sig: TAKE 1 TABLET BY MOUTH EVERY 12 HOURS AS NEEDED FOR PAIN    Dispense:  60 tablet    Refill:  2      I personally performed the services described in this documentation, which was scribed in my presence. The recorded information has been reviewed and considered, and addended by me as needed.   Pam Mckee, M.D.  Primary Care at [facility] [address] [place], [place] [address] [phone] phone [phone] fax  03/17/18 12:19 AM

## 2018-03-09 ENCOUNTER — Encounter: Payer: Self-pay | Admitting: Family Medicine

## 2018-03-09 LAB — COMPREHENSIVE METABOLIC PANEL
ALT: 14 IU/L (ref 0–32)
AST: 19 IU/L (ref 0–40)
Albumin/Globulin Ratio: 1.4 (ref 1.2–2.2)
Albumin: 4.2 g/dL (ref 3.5–5.5)
Alkaline Phosphatase: 85 IU/L (ref 39–117)
BUN/Creatinine Ratio: 27 — ABNORMAL HIGH (ref 9–23)
BUN: 23 mg/dL (ref 6–24)
Bilirubin Total: 0.3 mg/dL (ref 0.0–1.2)
CO2: 24 mmol/L (ref 20–29)
Calcium: 9.4 mg/dL (ref 8.7–10.2)
Chloride: 101 mmol/L (ref 96–106)
Creatinine, Ser: 0.84 mg/dL (ref 0.57–1.00)
GFR calc Af Amer: 96 mL/min/{1.73_m2} (ref >59)
GFR calc non Af Amer: 84 mL/min/{1.73_m2} (ref >59)
Globulin, Total: 2.9 g/dL (ref 1.5–4.5)
Glucose: 89 mg/dL (ref 65–99)
Potassium: 4.1 mmol/L (ref 3.5–5.2)
Sodium: 139 mmol/L (ref 134–144)
Total Protein: 7.1 g/dL (ref 6.0–8.5)

## 2018-03-09 LAB — LIPID PANEL
Chol/HDL Ratio: 4.1 ratio (ref 0.0–4.4)
Cholesterol, Total: 195 mg/dL (ref 100–199)
HDL: 48 mg/dL (ref >39)
LDL Calculated: 134 mg/dL — ABNORMAL HIGH (ref 0–99)
Triglycerides: 66 mg/dL (ref 0–149)
VLDL Cholesterol Cal: 13 mg/dL (ref 5–40)

## 2018-03-09 LAB — CBC WITH DIFFERENTIAL/PLATELET
Basophils Absolute: 0 10*3/uL (ref 0.0–0.2)
Basos: 0 %
EOS (ABSOLUTE): 0.3 10*3/uL (ref 0.0–0.4)
Eos: 3 %
Hematocrit: 37.4 % (ref 34.0–46.6)
Hemoglobin: 11.7 g/dL (ref 11.1–15.9)
Immature Grans (Abs): 0 10*3/uL (ref 0.0–0.1)
Immature Granulocytes: 0 %
Lymphocytes Absolute: 2.8 10*3/uL (ref 0.7–3.1)
Lymphs: 31 %
MCH: 25.3 pg — ABNORMAL LOW (ref 26.6–33.0)
MCHC: 31.3 g/dL — ABNORMAL LOW (ref 31.5–35.7)
MCV: 81 fL (ref 79–97)
Monocytes Absolute: 0.4 10*3/uL (ref 0.1–0.9)
Monocytes: 4 %
Neutrophils Absolute: 5.5 10*3/uL (ref 1.4–7.0)
Neutrophils: 62 %
Platelets: 353 10*3/uL (ref 150–379)
RBC: 4.63 x10E6/uL (ref 3.77–5.28)
RDW: 15 % (ref 12.3–15.4)
WBC: 9 10*3/uL (ref 3.4–10.8)

## 2018-03-09 LAB — VITAMIN D 25 HYDROXY (VIT D DEFICIENCY, FRACTURES): Vit D, 25-Hydroxy: 25.9 ng/mL — ABNORMAL LOW (ref 30.0–100.0)

## 2018-03-09 LAB — FERRITIN: Ferritin: 82 ng/mL (ref 15–150)

## 2018-03-09 LAB — HEMOGLOBIN A1C
Est. average glucose Bld gHb Est-mCnc: 117 mg/dL
Hgb A1c MFr Bld: 5.7 % — ABNORMAL HIGH (ref 4.8–5.6)

## 2018-03-09 LAB — VITAMIN B12: Vitamin B-12: 446 pg/mL (ref 232–1245)

## 2018-03-16 ENCOUNTER — Encounter: Payer: Self-pay | Admitting: Family Medicine

## 2018-03-16 ENCOUNTER — Other Ambulatory Visit: Payer: Self-pay | Admitting: Family Medicine

## 2018-03-16 NOTE — Telephone Encounter (Signed)
Miralax refill Last OV: note that addressed issue 10/24/15 Last Refill:not on med list Pharmacy:[facility]

## 2018-03-17 MED ORDER — VITAMIN D (ERGOCALCIFEROL) 1.25 MG (50000 UNIT) PO CAPS: 50000.0000 [IU] | ORAL_CAPSULE | ORAL | 1 refills | Status: DC

## 2018-03-17 MED FILL — POLYETHYLENE GLYCOL 3350 PO: 10 days supply | Qty: 510 | Fill #0

## 2018-03-17 MED FILL — VIT D2 1.25 MG (50,000 UNIT: 1.25 MG | 84 days supply | Qty: 24 | Fill #0

## 2018-03-17 NOTE — Telephone Encounter (Signed)
Refill sent in and pt notified over mychart.

## 2018-03-19 ENCOUNTER — Ambulatory Visit: Payer: 59 | Admitting: Registered"

## 2018-03-23 MED FILL — LEVOTHYROXINE 112 MCG TAB: 112 | 90 days supply | Qty: 90 | Fill #2

## 2018-03-30 ENCOUNTER — Encounter: Payer: 59 | Attending: Family Medicine | Admitting: Registered"

## 2018-03-30 DIAGNOSIS — E669 Obesity, unspecified: Secondary | ICD-10-CM | POA: Diagnosis not present

## 2018-03-30 DIAGNOSIS — Z713 Dietary counseling and surveillance: Secondary | ICD-10-CM | POA: Insufficient documentation

## 2018-03-30 NOTE — Patient Instructions (Addendum)
Goals: Physical activity: Walking 10 min before and after work, 5x days week  Progress: 10-15 min lunch 5x week, 5-10 min after work  Employee squat challenge 3-4x week Diet: Rethink your drink - Great work having mostly water! Use the handout for ideas for iron-rich foods  Combine iron rich foods with food high in Vit C: example adding citrus fruit to salad with greens.

## 2018-03-30 NOTE — Progress Notes (Signed)
Nutrition Counseling  Appt start time: 0800 end time:  0830.  [facility] Employee Visit 2  Assessment:   Patient states since last visit she has made some significant changes including she stopped cold [place] her [employer] coffee habit, ginger ale, cheese doodles and chips, and late night eating. Patient states her husband keeps offering to buy her soda or coffee, but patient states she doesn't have a problem not eating/drinking foods that she doesn't want to include in her diet now. Patient states she has a goal of drinking 32 oz of water before 12 pm. Patient states she usually has high energy level.  Patient states she is not ready to add additional goals but feels good about being able to continue the progress she has made thus far and wants to continue with current goals. Patient states she would like to lose 10 lbs per month and states she thinks it is ambitious.  TANITA  BODY COMP RESULTS  03/30/18   BMI (kg/m^2) 40.8   Fat Mass (lbs) 135.0   Fat Free Mass (lbs) 125.2   Total Body Water (lbs) 92.2    Did not discuss with patient at this visit updated labs (03/08/2018) since last visit: A1c 5.7%; PCP encouraged reduced sat fat to lower LDL   Preferred Learning Style:   No preference indicated   Learning Readiness:   Ready   MEDICATIONS: reviewed, Started phentermine, weekly Vit D   DIETARY INTAKE:  24-hr recall:  B ( AM): 1 boiled egg, green tea  Snk ( AM): trail mix ([employer]) OR oatmeal L ( PM): sandwich pastrami or [place], salad with ranch Snk ( PM): none D ( PM): rotiserrie chicken, 1/2 sweet potato Snk ( PM): none Beverages: water, green tea  Usual physical activity: 10-15 min at lunch break and a few min after work  Estimated energy needs: (based on Tanita scale reading) 1800 calories 180 g carbohydrates 126 g protein 64 g fat  Progress Towards Goal(s): initial goal     Intervention:  Nutrition Education. Discussed beverage choices and contribution to sugar in  the diet. Discussed natural vs added sugar and reading food labels. Discussed exercise frequency, intensity, time, type.  Goals: Physical activity: Walking 10 min before and after work, 5x days week  Progress: 10-15 min lunch 5x week, 5-10 min after work  Employee squat challenge 3-4x week Diet: Rethink your drink - Great work having mostly water! Use the handout for ideas for iron-rich foods  Combine iron rich foods with food high in Vit C: example adding citrus fruit to salad with greens.  Teaching Method Utilized:  Visual Auditory  Handouts given during visit include:  none  Barriers to learning/adherence to lifestyle change: none  Demonstrated degree of understanding via:  Teach Back   Monitoring/Evaluation:  Dietary intake, exercise, relevant labs, and body weight in 1 month(s).

## 2018-04-09 ENCOUNTER — Ambulatory Visit: Payer: 59 | Admitting: Registered"

## 2018-04-13 ENCOUNTER — Encounter: Payer: 59 | Attending: Family Medicine | Admitting: Registered"

## 2018-04-13 DIAGNOSIS — Z713 Dietary counseling and surveillance: Secondary | ICD-10-CM | POA: Diagnosis not present

## 2018-04-13 DIAGNOSIS — E669 Obesity, unspecified: Secondary | ICD-10-CM | POA: Diagnosis not present

## 2018-04-13 NOTE — Progress Notes (Signed)
Nutrition Counseling  Appt start time: 0800 end time:  0840.  [facility] Employee Visit 3  Assessment:   Patient states since last visit she continues to choose mostly water as her beverage and is no longer tempted by the cheese doodles which she used to eat on a daily basis. Patient states physical activity is about the same.   RD reviewed with patient Dr. Raul Del notes to her on blood sugar showing creeping up (A1c 5.7%) and elevated LDL. RD encouraged patient to review her notes on MyChart. RD believes patient's dietary changes will improve both LDL and A1c. RD discussed with pt maintaining healthy lifestyle changes can be challenging during stressful life events and encouraged patient to find healthy ways to manage stress before these situations hit to help prevent returning to familiar lifestyles from the past. Pt states she is planning on moving into a home in September and is looking forward to having more room and a fenced yard for her dog.  Patient states she has a second job that usually is one weekend a month ([employer]), but lately she has been covering for co-workers and picking up more hours.   Per Tanita scale readings, patient has remained stable over the last month. Patient will benefit from increased activity and RD encouraged patient to include weight/resistance training.  TANITA  BODY COMP RESULTS  04/13/18 03/30/18  Weight 256.4    BMI (kg/m^2) 40.2 40.8   Fat Mass (lbs) 134.8 135.0   Fat Free Mass (lbs) 121.6 125.2   Total Body Water (lbs) 89.6 92.2   Preferred Learning Style:   No preference indicated   Learning Readiness:   Ready   MEDICATIONS: reviewed   DIETARY INTAKE:  24-hr recall:  B ( AM): 1 boiled egg OR oatmeal, green tea  Snk ( AM): trail mix ([employer]) L ( PM): sandwich, salad OR chicken with ranch, zucchini sticks Snk ( PM): none D ( PM): 1 c rice, meat, spinach Snk ( PM): none Beverages: water, green tea  Usual physical activity: 10-15 min at lunch break  and a few min after work  Estimated energy needs: (based on Tanita scale reading) 1800 calories 180 g carbohydrates 126 g protein 64 g fat  Progress Towards Goal(s): initial goal     Intervention:  Nutrition Education. Discussed importance of maintaining healthy behaviors. Discussed stress management role in health. Discussed types of exercise and strategies to sneak in more activity during the day.   Plan: Continue with the great lifestyle changes, better food and beverage choices. Consider adding a little more exercise especially resistance training to help with bone strength. Consider finding some ways to manage stress.  Teaching Method Utilized:  Visual Auditory  Handouts given during visit include:  Arm chair activities  Barriers to learning/adherence to lifestyle change: none  Demonstrated degree of understanding via:  Teach Back   Monitoring/Evaluation:  Dietary intake, exercise, relevant labs, and body weight prn.

## 2018-04-13 NOTE — Patient Instructions (Signed)
Continue with the great lifestyle changes, better food and beverage choices. Consider adding a little more exercise especially resistance training to hlep with bone strength. Consider finding some ways to manage stress.

## 2018-04-19 ENCOUNTER — Other Ambulatory Visit: Payer: Self-pay

## 2018-04-19 ENCOUNTER — Ambulatory Visit (INDEPENDENT_AMBULATORY_CARE_PROVIDER_SITE_OTHER): Payer: 59 | Admitting: Family Medicine

## 2018-04-19 ENCOUNTER — Encounter: Payer: Self-pay | Admitting: Family Medicine

## 2018-04-19 VITALS — BP 114/84 | HR 87 | Temp 98.1°F | Resp 18 | Ht 67.0 in | Wt 259.4 lb

## 2018-04-19 DIAGNOSIS — I10 Essential (primary) hypertension: Secondary | ICD-10-CM | POA: Diagnosis not present

## 2018-04-19 DIAGNOSIS — G4733 Obstructive sleep apnea (adult) (pediatric): Secondary | ICD-10-CM

## 2018-04-19 MED ORDER — PHENTERMINE HCL 37.5 MG PO TABS: 37.5000 mg | ORAL_TABLET | Freq: Every day | ORAL | 1 refills | Status: DC

## 2018-04-19 MED FILL — PHENTERMINE 37.5 MG TABLET: 37.5 | 30 days supply | Qty: 30 | Fill #0

## 2018-04-19 NOTE — Progress Notes (Signed)
Subjective:  By signing my name below, I, Pam Mckee, attest that this documentation has been prepared under the direction and in the presence of [name], MD Electronically Signed: [name], ED Scribe 04/19/2018 at 9:04 AM.   Patient ID: Pam Mckee, female    DOB: [birth year], 47 y.o.   MRN: [id number]  Chief Complaint  Patient presents with  . Hypertension    Pt states she has been checking BPs at work. Pt states BPs have running good and arounf the 120s over 77 range.  . Follow-up   HPI Pam Mckee is a 47 y.o. female who presents to Primary Care at [facility] for f/u on HTN. Last OV she was working hard at diet for weight lost. Had done well on phentermine and warned of cardiac warning. Pt felt she was very well controlled on current regimen as she was already working with a nutritionist and wanted to start aerobic classes. She is here today for a weight check and to make sure that he vital signs on new meds are stable. - Pt has been taking 1 tab of phentermine but states she has not noticed a difference. She tates that she has not been exercising much, only walking for exercise. States she plans to start Zumba at least 2 days/wk. Also reports that yesterday for breakfast she had a potato hash brown with orange juice for Mother's Day but did not eat lunch. Reports that she went out to eat for dinner. However, she reports that she typically has an apple, packet of oatmeal with a cup of orange juice or green tea with 2 spoons of sugar for breakfast. She also has a boiled egg around 10 AM. Pt has a salad with chicken or a sandwich for lunch. Dinner consists of rice, meat and a vegetable. She states that she plans to cut out orange juice and cut back on the sugar in her green tea. Pt does not report any symptoms.  Wt Readings from Last 3 Encounters:  04/19/18 259 lb 6.4 oz (117.7 kg)  04/13/18 256 lb 6.4 oz (116.3 kg)  03/30/18 260 lb 3.2 oz (118 kg)   Vit D Deficiency Pt does better  with once wkly high dose. She has never been at a normal range. We increased dose to twice wkly 50k x 6 months with plan to recheck towards the end of coarse in Sept. Also plan to transition back to 50k once wkly with plan to continue indefinitely.  Past Medical History:  Diagnosis Date  . Anemia   . Hypertension   . Olecranon bursitis of right elbow 02/15/2015   Suspect this was an acute gout flare triggered by HCTZ since she had no trauma   . OSA on CPAP   . Primary osteoarthritis of left knee 05/18/2015   Nonweightbearing x-rays revealed mild degenerative changes.  Referred to [facility]  - saw [place] , [name]   . Seizures ([place])   . Thyroid disease    hypo  . Torn LEFT medial meniscus 04/2015   Left knee MRI confirmed but no instability sxs. Seen at [place] 04/2016 to review treatment plan. sxs responded to cortisone injection summer 2016 and 04/2016 so continue prn. Consider arthroscopy if sxs become unresponsive.   Current Outpatient Medications on File Prior to Visit  Medication Sig Dispense Refill  . ALL DAY ALLERGY 10 MG tablet TAKE 1 TABLET BY MOUTH AT BEDTIME. 30 tablet 11  . hydrochlorothiazide (HYDRODIURIL) 25 MG tablet Take 1  tablet (25 mg total) by mouth daily. 90 tablet 3  . levonorgestrel (MIRENA) 20 MCG/24HR IUD 1 each by Intrauterine route once.    Marland Kitchen levothyroxine (SYNTHROID, LEVOTHROID) 112 MCG tablet Take 1 tablet by mouth daily.  4  . losartan (COZAAR) 100 MG tablet Take 1 tablet (100 mg total) by mouth daily. 90 tablet 3  . naproxen (NAPROSYN) 500 MG tablet TAKE 1 TABLET BY MOUTH EVERY 12 HOURS AS NEEDED FOR PAIN 60 tablet 2  . phentermine 15 MG capsule Take 1 capsule (15 mg total) by mouth every morning. May repeat x 1 at noon if needed 60 capsule 0  . polyethylene glycol powder (GLYCOLAX/MIRALAX) powder Mix 17g of powder (capful) into 8 oz or more of any clear liquid and drink po tid prn mild constipation 527 g 11  . Prenatal MV-Min-FA-Omega-3 (PRENATAL  GUMMIES/DHA & FA PO) Take by mouth.    . Vitamin D, Ergocalciferol, (DRISDOL) 50000 units CAPS capsule Take 1 capsule (50,000 Units total) by mouth 2 (two) times a week. 24 capsule 1   No current facility-administered medications on file prior to visit.    No Known Allergies   Past Surgical History:  Procedure Laterality Date  . INTRAUTERINE DEVICE (IUD) INSERTION  06/2016   Family History  Problem Relation Age of Onset  . Hypertension Mother   . CVA Mother        occured while on lovenox  . Hypertension Brother   . Aneurysm Brother   . Diabetes Paternal Grandfather   . Renal Disease Father    Social History   Socioeconomic History  . Marital status: Single    Spouse name: Not on file  . Number of children: 1  . Years of education: HS  . Highest education level: Not on file  Occupational History  . Occupation: Medical illustrator: [employer]  . Financial resource strain: Not on file  . Food insecurity:    Worry: Not on file    Inability: Not on file  . Transportation needs:    Medical: Not on file    Non-medical: Not on file  Tobacco Use  . Smoking status: Never Smoker  . Smokeless tobacco: Never Used  Substance and Sexual Activity  . Alcohol use: No    Alcohol/week: 0.0 standard drinks  . Drug use: No  . Sexual activity: Yes    Partners: Male    Birth control/protection: Condom, IUD  Lifestyle  . Physical activity:    Days per week: Not on file    Minutes per session: Not on file  . Stress: Not on file  Relationships  . Social connections:    Talks on phone: Not on file    Gets together: Not on file    Attends religious service: Not on file    Active member of club or organization: Not on file    Attends meetings of clubs or organizations: Not on file    Relationship status: Not on file  Other Topics Concern  . Not on file  Social History Narrative      Drinks about 2-3 cups of coffee/tea a day. Education: [employer].    Depression screen [name] 2/9 04/19/2018 03/30/2018 03/08/2018 02/19/2018 04/02/2017  Decreased Interest 0 0 0 0 0  Down, Depressed, Hopeless 0 0 0 0 0  PHQ - 2 Score 0 0 0 0 0    Review of Systems  Cardiovascular: Negative for palpitations.  Objective:   Physical Exam  Constitutional: She is oriented to person, place, and time. She appears well-developed and well-nourished. No distress.  HENT:  Head: Normocephalic and atraumatic.  Eyes: Conjunctivae and EOM are normal.  Neck: Neck supple. No tracheal deviation present.  Cardiovascular: Normal rate and regular rhythm.  Pulmonary/Chest: Effort normal and breath sounds normal. No respiratory distress.  Musculoskeletal: Normal range of motion.  Neurological: She is alert and oriented to person, place, and time.  Skin: Skin is warm and dry.  Psychiatric: She has a normal mood and affect. Her behavior is normal.  Nursing note and vitals reviewed.  BP 114/84 (BP Location: Left Arm, Patient Position: Sitting, Cuff Size: Large)   Pulse 87   Temp 98.1 F (36.7 C) (Oral)   Resp 18   Ht 5\' 7"  (1.702 m)   Wt 259 lb 6.4 oz (117.7 kg)   SpO2 98%   BMI 40.63 kg/m     Assessment & Plan:   1. Essential hypertension   2. Obstructive sleep apnea   3. Severe obesity (BMI >= 40) (HCC)     Meds ordered this encounter  Medications  . phentermine (ADIPEX-P) 37.5 MG tablet    Sig: Take 1 tablet (37.5 mg total) by mouth daily before breakfast. Or take 1/2 tab before breakfast and half tab before lunch    Dispense:  30 tablet    Refill:  1   I personally performed the services described in this documentation, which was scribed in my presence. The recorded information has been reviewed and considered, and addended by me as needed.   [name], M.D.  Primary Care at [facility] [address] [place], [place] [address] [phone] phone [phone] fax  09/15/18 9:28 PM

## 2018-04-19 NOTE — Patient Instructions (Addendum)
You lost 5 lbs in the first 3 weeks - this is what you need to aim for. You lost 1 lb in the next 3 weeks - not good. You lost 6 lbs in the past 6 weeks (should be 10-12 if you are taking a weight loss drug) but you weight the same as you did 3 months ago. WATCH YOUR SUGAR AND CARB INTAKE - the meal examples you gave me all highlighted sugar and carbs in them prominently - oatmeal packets (make your own or original oatmeal is good but the microwave flavor packets have lot of sugar/carbs), oj, tea w/ sugar, hasbrowns/potatoes, rice, bread - all things over the next 6 months that you are focusing on your weight loss journey that should be rare "splurge" foods. Focus on high protein and veggie and whole fruits. Check out "the whole 30."  Recheck in another 2 months on the medication to ensure you are still loosing weight.  Your commitments: ZUMBA 2-3x/wk. NO JUICE OR ADDED SUGAR TO DRINKS.   IF you received an x-ray today, you will receive an invoice from [facility]. Please contact [facility] at [phone] with questions or concerns regarding your invoice.   IF you received labwork today, you will receive an invoice from [place]. Please contact [facility] at [phone] with questions or concerns regarding your invoice.   Our billing staff will not be able to assist you with questions regarding bills from these companies.  You will be contacted with the lab results as soon as they are available. The fastest way to get your results is to activate your My Chart account. Instructions are located on the last page of this paperwork. If you have not heard from [place] regarding the results in 2 weeks, please contact this office.    Weight Loss Program    Restaurants  Eating out at restaurants has become a way of life for most of [place].  On an average basis, Americans eat more than 25% of their meals away from home.  When eating a meal at a restaurant, it is important to plan and think  ahead about what healthy food choices are available. In addition, it is important to think that every meal out is a special occasion and to practice portion control.  Most restaurants serve portions that are 2-3 times bigger than what is considered normal.  There are several ways to control these portion sizes:  Share the meal with another person, have half of the meal bagged "to go" before eating, or eat half and leave the rest behind.  If the bread basket is your weakness, ask that it not be served.  Portion sizes also pertain to drinks. Water, diet soda, and unsweetened iced tea are the best calorie free beverages available. Multiple refills of regular soda and alcoholic beverages greatly increase total calorie intake.   The following items offer smart, low calore choice when eating at a restaurant as well as the appropriate serving sizes:  Fast Food: Fried chicken sandwich with lettuce and tomato, mayonnaise, cheese and [place] fries should be avoided.  Many salads at fast food restaurants have more calories and fat than a hamburger.  Make sure salad has grilled meat only with no cheese  Or nuts.  Fat free or light dressings should always be used.  [place] Food: 1 cup egg drop soup or 1 cup of Chinese vegetables with shrimp or tofin.  Avoid all fried food, including fried rice.  One half to 1 cup steamed white  rice is also acceptable.   [place] Food: 1 small bean burrito or 2 chicken fajitas without cheese, sour cream and guacamole.  Fat free or light sour cream is allowed.  [place] Food: Spaghetti with [facility] (1 cup spaghetti, 1/2 cup sauce).  Cram sauces (alfredo) and fried foods (chicken, eggplant, veal parmesan) should be avoided.  [place] Food: Sushi and teriyaki fish or chicken are healthy options served with steamed vegetables.  Brunch Buffet: 1 cup fruit salad, 1 cup oatmeal, 2 pancakes with light syrup, 1/2 cup scrambled eggs, toast with jelly.  Biscuits and gravy, bacon and  sausage should be avoided.  [place] bacon is acceptable.  Commitment- Commitment is a critical part of any successful project and is the difference between failure and success.  It involves focusing on your goal seriously and using will power to get through the rough times.  This allows you to discover inner strengths and develop a confident and an in control new you.   Dieting is not an easy task and there are bound to be rough times.  However, if your commitment to yourself and your weight loss is strong, you will be successful in achieving your goal.  Don't break your promise to yourself.    Diet Modification:  The main components of a healthy diet include 3 small meals with 3 healthy snakes throughout each day.  Your should not go more than 4 hours without eating a small snack or meal.  This helps avoid "starvation" and decreased the urge to choose unhealthy foods. The following guidelines should be followed through the day:  Focus on portion size and control   It is important to read nutrition labels so that you are aware of the appropriate serving size  For those foods that do not have a nutrition label, you can generally use your had as a guide for the appropriate servings sizes.  Fist 1 cup or medium whole fruit  Thumb (tip to base) 1 ounce of meat or chees  Thumb tip (tip to 1st joint) 1 tablespoon  Fingertip (tip to 1st joint) 1 teaspoon  Cupped hand 1-2 ounces of nuts, pretzels, popcorn  Palm (minus fingers) 3 ounces of meat, fish or poultry    Eat breakfast daily to increase metabolism and prevent loss of control later in the day.  Research has repeatedly shown that patients who eat breakfast daily lose more weight and keep the weight off more effectively than patients who skip breakfast.  One serving of high fiber cereal (> 3 grams of fiber) with 1 cup of skim milk.  One serving of old fashioned oatmeal ( not instant) made with skim milk.  May add 1/2 cup chopped fruit an  sugar substitute for added flavor.  Two egg omelet made with egg beaters. May add 1 tablespoon reduced fat cheese and unlimited vegetables.  One slice whole grain toast with 1 teaspoon Smart Beat margarine.  Avoid fruit juice since it has too many sugars and too little fiber. Coffee and tea fine with sugar substitute and skim or fat free dairy creamer.  Mid morning snack-choosing one of the following will help keep the craving under control.  One piece of fruit-apple, pear, banana, 1 cup of grapes  One half cup 2% cottage cheese  One container of fat free yogurt  One low fat mozzarella cheese stick  1 teaspoon natural peanut butter on celery sticks  1-2 cups air popped popcorn sprayed no more than twice with fat free margarine spray (  May only have one per day)    Lunch  Start with an all vegetable salad ( no cheese or croutons with 1 teaspoon fat free or light dressing salad dressing.  Many patients find it convenient to eat a prepared meal such as Healthy [place], [employer], [employer], [employer].  It is important to alternate these prepared meals with homemade meals due to their high salt content.  See dinner options for homemade meal ideas.  May add 1/2 cup rice to meal if prepared meal is not adequate  Piece of fruit.  Mid Afternoon Snack-Same as mid morning snack  Dinner  May pick one serving from each of the following categories (derived from [place] best selling book, :"Body for Live").  Start with an all vegetable salad (no cheese or croutons) with 1 teaspoon of fat free or light salad dressing or 1 cup low fat broth based soup Good Proteins (Palm-size portion 4oz) Good Carbohydrates (Fist-sized portion) Good Vegetables  Baked or grilled chicken breast Baked Potato Broccoli  [place] Breast Sweet Potato Asparagus  Lean ground [place] Yam Lettuce  Fish (baked, broiled, grilled, blackened allowed) [employer] ( 1/2-1 cup) Carrots  Tuna (Fresh or canned in  water) Wild rice (1/2-1 cup) Cauliflower  Crab Pasta ( 1 cup whole grain) Green Beans  Shrimp Oatmeal Green Peppers  Top round steak Beans(red, pinto, black) Mushrooms   Top sirloin steak [place] representative ground beef Strawberries Tomato  Low-fat cottage cheese Whole wheat bread (1 slice regular 2 slice light) Artichoke    Cabbage    Zucchini    Cucumber    Onion    Prepare these foods using low-fat techniques such as grilling, baking, broiling, blackening meats and steaming vegetables.  Avoid canned vegetables and fruits to decrease sodium intake.  Fresh and froze produce offer the most nutritional value  Evening Snack or "Dessert"  1/2 cup skim milk with 1 package of No sugar added hot cocoa mix  1 serving of Weight Watchers frozen desserts, Fudgesicle bar, Skinny cow ice cream treats  1 cup Sugar Free Jell-O or instant pudding topped with 1 teaspoon fat free whipped topping    1-2 cups air popped popcorn sprayed no more than twice with Fat Free Margarine Spray (may only have once per day)  Water  Eight glasses of water daily (lemon, sugar substitute, and crystal light accepted)  All regular sodas and sweetened tea are NOT allowed.  This results in taking in too many empty calories.  Two diet sodas per day are allowed as are an unlimited amount of decaffeinated coffee and tea as long as sugar substitute (Splenda, Sweet and Low, Equal) only are used for sweetening.  Water naturally suppresses the appetite and helps the body metabolize stored fat  Drinking enough water is the best treatment for fluid retention (unless you have been diagnosed with kidney deficiency).  When the body gets less water, it senses it as a threat and holds on to the water.  It is stored in places such as the feet, legs and hands.  By drinking plenty of water, the problem of water, the problem of water retention is solved and weight loss occurs.  The above diet modifications are based on the  following principles:  25-35 grams of fiber daily (found in vegetables, whole grain, oats, beans and fruits)  Choose healthy carbohydrates that are rich in fiber, vitamins, and minerals (wheat bread vs white bread, wheat pasta vs regular pasta)  Healthy fats:  Choose canola or olive oil if you must use oil to cook with.  Pam cooking spray is a good substitute.  Smart Choice margarine is a healthy alternative to other margarine and butter options.  Baked goods and fried foods should be avoided to decrease calorie intake harmful trans fatty acids and saturated fat absorb in the body.  Healthy Proteins: Americans consume far too much red meat (high amount of saturated fat). Healthier sources of protein are baked or broiled fish,poultry (without skin), beans, low-fat cottage cheese, egg whites and egg substitutes.  If you choose red meat, pick lean cuts such as top round, to sirloin or round,  The following will be implemented to help assist you in achieving successful weight loss on a weekly basis:  Food Diary: Keeping a record of all food you have eaten on a daily basis helps to make you aware of your food choices and helps determine the amount you have eaten.  It also serves as a great reference to look back to see what you did well on during the weeks you were most successful.  Weekly Meal Plan: Plan your meals and go grocery shopping on a weekly basis.  Those patients that plan their meals are far more successful on reaching their goals than other patients.  Without a plan, it is easy to fall back into old eating habits.  Weekly weigh in: Patients who report weekly for weight checks do better than those that try to diet without help.  Knowing your weight will be checked weekly helps keep you on track as well as staying motivated by the encouragement received on a weekly basis.    Exercise  You will need to begin exercising at least 5 days a week.  If you are just beginning an exercise program,  start slowly at 15-20 minutes and gradually work up to a longer period of time.  If you are tolerating the exercise well you can increase by 5 minutes each day.  You may use any form of exercise you wish (aerobics, treadmill, stair stepper, elliptical machine).  If you can walk/jog two miles in 30 minutes,  you are at a good pace for a successful start.   The following facilities in [place] area offer gym memberships:  [employer] gym: Only $9.00 a month  [employer]: [phone]  [facility]: [phone] ($29.00 per month)  [facility] [phone]   Exercising to Lose Weight Exercising can help you to lose weight. In order to lose weight through exercise, you need to do vigorous-intensity exercise. You can tell that you are exercising with vigorous intensity if you are breathing very hard and fast and cannot hold a conversation while exercising. Moderate-intensity exercise helps to maintain your current weight. You can tell that you are exercising at a moderate level if you have a higher heart rate and faster breathing, but you are still able to hold a conversation. How often should I exercise? Choose an activity that you enjoy and set realistic goals. Your health care provider can help you to make an activity plan that works for you. Exercise regularly as directed by your health care provider. This may include: Doing resistance training twice each week, such as: Push-ups. Sit-ups. Lifting weights. Using resistance bands. Doing a given intensity of exercise for a given amount of time. Choose from these options: 150 minutes of moderate-intensity exercise every week. 75 minutes of vigorous-intensity exercise every week. A mix of moderate-intensity and vigorous-intensity exercise every week.  Children, pregnant  women, people who are out of shape, people who are overweight, and older adults may need to consult a health care provider for individual recommendations. If you have  any sort of medical condition, be sure to consult your health care provider before starting a new exercise program. What are some activities that can help me to lose weight? Walking at a rate of at least 4.5 miles an hour. Jogging or running at a rate of 5 miles per hour. Biking at a rate of at least 10 miles per hour. Lap swimming. Roller-skating or in-line skating. Cross-country skiing. Vigorous competitive sports, such as football, basketball, and soccer. Jumping rope. Aerobic dancing. How can I be more active in my day-to-day activities? Use the stairs instead of the elevator. Take a walk during your lunch break. If you drive, park your car farther away from work or school. If you take public transportation, get off one stop early and walk the rest of the way. Make all of your phone calls while standing up and walking around. Get up, stretch, and walk around every 30 minutes throughout the day. What guidelines should I follow while exercising? Do not exercise so much that you hurt yourself, feel dizzy, or get very short of breath. Consult your health care provider prior to starting a new exercise program. Wear comfortable clothes and shoes with good support. Drink plenty of water while you exercise to prevent dehydration or heat stroke. Body water is lost during exercise and must be replaced. Work out until you breathe faster and your heart beats faster. This information is not intended to replace advice given to you by your health care provider. Make sure you discuss any questions you have with your health care provider. Document Released: 12/27/2010 Document Revised: 05/01/2016 Document Reviewed: 04/27/2014 Elsevier Interactive Patient Education  [employer].

## 2018-05-14 MED FILL — SM CLEARLAX POWDER: 14 days supply | Qty: 714 | Fill #1

## 2018-06-04 MED FILL — PHENTERMINE 37.5 MG TABLET: 37.5 | 30 days supply | Qty: 30 | Fill #1

## 2018-06-07 MED FILL — HYDROCHLOROTHIAZIDE 25 MG T: 25 | 90 days supply | Qty: 90 | Fill #1

## 2018-06-07 MED FILL — LOSARTAN POTASSIUM 100 MG T: 100 | 90 days supply | Qty: 90 | Fill #1

## 2018-06-21 ENCOUNTER — Ambulatory Visit: Payer: 59 | Admitting: Family Medicine

## 2018-06-24 MED FILL — POLYETHYLENE GLYCOL 3350 PO: 10 days supply | Qty: 510 | Fill #2

## 2018-07-05 MED FILL — NAPROXEN 500 MG TABLET: 500 | 30 days supply | Qty: 60 | Fill #3

## 2018-07-07 MED FILL — LEVOTHYROXINE 112 MCG TAB: 112 | 30 days supply | Qty: 30 | Fill #3

## 2018-08-03 NOTE — Progress Notes (Deleted)
47 y.o. [id number] SingleAfrican AmericanF here for annual exam.      No LMP recorded. (Menstrual status: IUD).          Sexually active: {yes no:314532}  The current method of family planning is {contraception:315051}.    Exercising: {yes no:314532}  {types:19826} Smoker:  {YES [id number]  Health Maintenance: Pap:  05/18/2015 normal History of abnormal Pap:  {YES NO:22349} MMG:  10/07/2017  BI-RADS CATEGORY  1: Negative Colonoscopy:  *** BMD:   *** TDaP:  2013 Gardasil: ***   reports that she has never smoked. She has never used smokeless tobacco. She reports that she does not drink alcohol or use drugs.  Past Medical History:  Diagnosis Date  . Anemia   . Hypertension   . Olecranon bursitis of right elbow 02/15/2015   Suspect this was an acute gout flare triggered by HCTZ since she had no trauma   . OSA on CPAP   . Primary osteoarthritis of left knee 05/18/2015   Nonweightbearing x-rays revealed mild degenerative changes.  Referred to [facility]  - saw [place] , [name]   . Seizures ([place])   . Thyroid disease    hypo  . Torn LEFT medial meniscus 04/2015   Left knee MRI confirmed but no instability sxs. Seen at [facility] 04/2016 to review treatment plan. sxs responded to cortisone injection summer 2016 and 04/2016 so continue prn. Consider arthroscopy if sxs become unresponsive.    Past Surgical History:  Procedure Laterality Date  . INTRAUTERINE DEVICE (IUD) INSERTION  06/2016    Current Outpatient Medications  Medication Sig Dispense Refill  . ALL DAY ALLERGY 10 MG tablet TAKE 1 TABLET BY MOUTH AT BEDTIME. 30 tablet 11  . hydrochlorothiazide (HYDRODIURIL) 25 MG tablet Take 1 tablet (25 mg total) by mouth daily. 90 tablet 3  . levonorgestrel (MIRENA) 20 MCG/24HR IUD 1 each by Intrauterine route once.    Marland Kitchen levothyroxine (SYNTHROID, LEVOTHROID) 112 MCG tablet Take 1 tablet by mouth daily.  4  . losartan (COZAAR) 100 MG tablet Take 1 tablet (100 mg total) by mouth  daily. 90 tablet 3  . naproxen (NAPROSYN) 500 MG tablet TAKE 1 TABLET BY MOUTH EVERY 12 HOURS AS NEEDED FOR PAIN 60 tablet 2  . phentermine (ADIPEX-P) 37.5 MG tablet Take 1 tablet (37.5 mg total) by mouth daily before breakfast. Or take 1/2 tab before breakfast and half tab before lunch 30 tablet 1  . polyethylene glycol powder (GLYCOLAX/MIRALAX) powder Mix 17g of powder (capful) into 8 oz or more of any clear liquid and drink po tid prn mild constipation 527 g 11  . Prenatal MV-Min-FA-Omega-3 (PRENATAL GUMMIES/DHA & FA PO) Take by mouth.    . Vitamin D, Ergocalciferol, (DRISDOL) 50000 units CAPS capsule Take 1 capsule (50,000 Units total) by mouth 2 (two) times a week. 24 capsule 1   No current facility-administered medications for this visit.     Family History  Problem Relation Age of Onset  . Hypertension Mother   . CVA Mother        occured while on lovenox  . Hypertension Brother   . Aneurysm Brother   . Diabetes Paternal Grandfather   . Renal Disease Father     Review of Systems  Constitutional: Negative.   HENT: Negative.   Eyes: Negative.   Respiratory: Negative.   Cardiovascular: Negative.   Gastrointestinal: Negative.   Endocrine: Negative.   Genitourinary: Negative.   Musculoskeletal: Negative.   Skin: Negative.   Allergic/Immunologic: Negative.  Neurological: Negative.   Hematological: Negative.   Psychiatric/Behavioral: Negative.   All other systems reviewed and are negative.   Exam:   There were no vitals taken for this visit.  Weight change: @WEIGHTCHANGE @ Height:      Ht Readings from Last 3 Encounters:  04/19/18 5\' 7"  (1.702 m)  04/13/18 5\' 7"  (1.702 m)  03/30/18 5\' 7"  (1.702 m)    General appearance: alert, cooperative and appears stated age Head: Normocephalic, without obvious abnormality, atraumatic Neck: no adenopathy, supple, symmetrical, trachea midline and thyroid {CHL AMB PHY EX THYROID NORM [phone]::"normal to inspection and  palpation"} Lungs: clear to auscultation bilaterally Cardiovascular: regular rate and rhythm Breasts: {Exam; breast:13139::"normal appearance, no masses or tenderness"} Abdomen: soft, non-tender; non distended,  no masses,  no organomegaly Extremities: extremities normal, atraumatic, no cyanosis or edema Skin: Skin color, texture, turgor normal. No rashes or lesions Lymph nodes: Cervical, supraclavicular, and axillary nodes normal. No abnormal inguinal nodes palpated Neurologic: Grossly normal   Pelvic: External genitalia:  no lesions              Urethra:  normal appearing urethra with no masses, tenderness or lesions              Bartholins and Skenes: normal                 Vagina: normal appearing vagina with normal color and discharge, no lesions              Cervix: {CHL AMB PHY EX CERVIX NORM [phone]::"no lesions"}               Bimanual Exam:  Uterus:  {CHL AMB PHY EX UTERUS NORM DEFAULT:[phone]::"normal size, contour, position, consistency, mobility, non-tender"}              Adnexa: {CHL AMB PHY EX ADNEXA NO MASS DEFAULT:[phone]::"no mass, fullness, tenderness"}               Rectovaginal: Confirms               Anus:  normal sphincter tone, no lesions  Chaperone was present for exam.  A:  Well Woman with normal exam  P:

## 2018-08-04 ENCOUNTER — Ambulatory Visit (INDEPENDENT_AMBULATORY_CARE_PROVIDER_SITE_OTHER): Payer: 59 | Admitting: Obstetrics and Gynecology

## 2018-08-04 ENCOUNTER — Encounter: Payer: Self-pay | Admitting: Obstetrics and Gynecology

## 2018-08-04 ENCOUNTER — Encounter

## 2018-08-04 ENCOUNTER — Other Ambulatory Visit (HOSPITAL_COMMUNITY)
Admission: RE | Admit: 2018-08-04 | Discharge: 2018-08-04 | Disposition: A | Discharge status: Home / Self Care | Payer: 59 | Source: Ambulatory Visit | Attending: Obstetrics and Gynecology | Admitting: Obstetrics and Gynecology

## 2018-08-04 ENCOUNTER — Other Ambulatory Visit: Payer: Self-pay

## 2018-08-04 ENCOUNTER — Ambulatory Visit: Payer: 59 | Admitting: Obstetrics and Gynecology

## 2018-08-04 VITALS — BP 132/80 | HR 68 | Ht 65.75 in | Wt 264.0 lb

## 2018-08-04 DIAGNOSIS — Z30431 Encounter for routine checking of intrauterine contraceptive device: Secondary | ICD-10-CM | POA: Diagnosis not present

## 2018-08-04 DIAGNOSIS — Z01419 Encounter for gynecological examination (general) (routine) without abnormal findings: Secondary | ICD-10-CM | POA: Diagnosis not present

## 2018-08-04 DIAGNOSIS — Z124 Encounter for screening for malignant neoplasm of cervix: Secondary | ICD-10-CM

## 2018-08-04 DIAGNOSIS — Z1211 Encounter for screening for malignant neoplasm of colon: Secondary | ICD-10-CM | POA: Diagnosis not present

## 2018-08-04 NOTE — Progress Notes (Signed)
47 y.o. Pam Mckee SingleAfrican AmericanF here for annual exam.  She has a h/o a fibroid uterus, menorrhagia and anemia. She had a mirena IUD placed in 7/17, no cycles with the IUD. No bleeding. She is living with her boyfriend, not sexually active. She is going to move out.     No LMP recorded. (Menstrual status: IUD).          Sexually active: No.  The current method of family planning is IUD.    Exercising: No.  n/a Smoker:  no  Health Maintenance: Pap:  05/18/2015 negative repeat in 5 years History of abnormal Pap:  no MMG: 09/09/2017 BI-RADS CATEGORY  1: Negative. Colonoscopy:  n/a BMD:   n/a TDaP:  2013 Gardasil: n/a   reports that she has never smoked. She has never used smokeless tobacco. She reports that she does not drink alcohol or use drugs. She works as a Occupational psychologist. Son is 56 lives in [place]  Past Medical History:  Diagnosis Date  . Anemia   . Hypertension   . Olecranon bursitis of right elbow 02/15/2015   Suspect this was an acute gout flare triggered by HCTZ since she had no trauma   . OSA on CPAP   . Primary osteoarthritis of left knee 05/18/2015   Nonweightbearing x-rays revealed mild degenerative changes.  Referred to [facility]  - saw [place] , [name]   . Seizures ([place])   . Thyroid disease    hypo  . Torn LEFT medial meniscus 04/2015   Left knee MRI confirmed but no instability sxs. Seen at [facility] 04/2016 to review treatment plan. sxs responded to cortisone injection summer 2016 and 04/2016 so continue prn. Consider arthroscopy if sxs become unresponsive.    Past Surgical History:  Procedure Laterality Date  . INTRAUTERINE DEVICE (IUD) INSERTION  06/2016    Current Outpatient Medications  Medication Sig Dispense Refill  . ALL DAY ALLERGY 10 MG tablet TAKE 1 TABLET BY MOUTH AT BEDTIME. 30 tablet 11  . hydrochlorothiazide (HYDRODIURIL) 25 MG tablet Take 1 tablet (25 mg total) by mouth daily. 90 tablet 3  . levonorgestrel (MIRENA) 20 MCG/24HR IUD 1  each by Intrauterine route once.    Marland Kitchen levothyroxine (SYNTHROID, LEVOTHROID) 112 MCG tablet Take 1 tablet by mouth daily.  4  . losartan (COZAAR) 100 MG tablet Take 1 tablet (100 mg total) by mouth daily. 90 tablet 3  . naproxen (NAPROSYN) 500 MG tablet TAKE 1 TABLET BY MOUTH EVERY 12 HOURS AS NEEDED FOR PAIN 60 tablet 2  . phentermine (ADIPEX-P) 37.5 MG tablet Take 1 tablet (37.5 mg total) by mouth daily before breakfast. Or take 1/2 tab before breakfast and half tab before lunch 30 tablet 1  . polyethylene glycol powder (GLYCOLAX/MIRALAX) powder Mix 17g of powder (capful) into 8 oz or more of any clear liquid and drink po tid prn mild constipation 527 g 11  . Prenatal MV-Min-FA-Omega-3 (PRENATAL GUMMIES/DHA & FA PO) Take by mouth.    . Vitamin D, Ergocalciferol, (DRISDOL) 50000 units CAPS capsule Take 1 capsule (50,000 Units total) by mouth 2 (two) times a week. 24 capsule 1   No current facility-administered medications for this visit.     Family History  Problem Relation Age of Onset  . Hypertension Mother   . CVA Mother        occured while on lovenox  . Hypertension Brother   . Aneurysm Brother   . Diabetes Paternal Grandfather   . Renal Disease Father  Review of Systems  Constitutional: Negative.   HENT: Negative.   Eyes: Negative.   Respiratory: Negative.   Cardiovascular: Negative.   Gastrointestinal: Negative.   Endocrine: Negative.   Genitourinary: Negative.   Musculoskeletal: Negative.   Skin: Negative.        Chaffing in skin folds, uses vagasil and coco cream  Allergic/Immunologic: Negative.   Neurological: Negative.   Hematological: Negative.   Psychiatric/Behavioral: Negative.   All other systems reviewed and are negative.   Exam:   BP 132/80   Pulse 68   Ht 5' 5.75" (1.67 m)   Wt 264 lb (119.7 kg)   BMI 42.94 kg/m   Weight change: @WEIGHTCHANGE @ Height:   Height: 5' 5.75" (167 cm)  Ht Readings from Last 3 Encounters:  08/04/18 5' 5.75" (1.67 m)   04/19/18 5\' 7"  (1.702 m)  04/13/18 5\' 7"  (1.702 m)    General appearance: alert, cooperative and appears stated age Head: Normocephalic, without obvious abnormality, atraumatic Neck: no adenopathy, supple, symmetrical, trachea midline and thyroid enlarged and symmetrical (known) Lungs: clear to auscultation bilaterally Cardiovascular: regular rate and rhythm Breasts: normal appearance, no masses or tenderness Abdomen: soft, non-tender; non distended,  no masses,  no organomegaly Extremities: extremities normal, atraumatic, no cyanosis or edema Skin: Skin color, texture, turgor normal. No rashes or lesions Lymph nodes: Cervical, supraclavicular, and axillary nodes normal. No abnormal inguinal nodes palpated Neurologic: Grossly normal   Pelvic: External genitalia:  no lesions              Urethra:  normal appearing urethra with no masses, tenderness or lesions              Bartholins and Skenes: normal                 Vagina: normal appearing vagina with normal color and discharge, no lesions              Cervix: no lesions and IUD string 4 cm               Bimanual Exam:  Uterus:  10 week sized, irregular, mobile, not tender. No change              Adnexa: no mass, fullness, tenderness               Rectovaginal: Confirms               Anus:  normal sphincter tone, no lesions  Chaperone was present for exam.  A:  Well Woman with normal exam  IUD check, doing well  Fibroid uterus, stable  P:   Pap with hpv  Discussed breast self exam  Discussed calcium and vit D intake  Mammogram in 10/19  IFOB  Labs with primary

## 2018-08-04 NOTE — Patient Instructions (Signed)
EXERCISE AND DIET:  We recommended that you start or continue a regular exercise program for good health. Regular exercise means any activity that makes your heart beat faster and makes you sweat.  We recommend exercising at least 30 minutes per day at least 3 days a week, preferably 4 or 5.  We also recommend a diet low in fat and sugar.  Inactivity, poor dietary choices and obesity can cause diabetes, heart attack, stroke, and kidney damage, among others.    ALCOHOL AND SMOKING:  Women should limit their alcohol intake to no more than 7 drinks/beers/glasses of wine (combined, not each!) per week. Moderation of alcohol intake to this level decreases your risk of breast cancer and liver damage. And of course, no recreational drugs are part of a healthy lifestyle.  And absolutely no smoking or even second hand smoke. Most people know smoking can cause heart and lung diseases, but did you know it also contributes to weakening of your bones? Aging of your skin?  Yellowing of your teeth and nails?  CALCIUM AND VITAMIN D:  Adequate intake of calcium and Vitamin D are recommended.  The recommendations for exact amounts of these supplements seem to change often, but generally speaking 600 mg of calcium (either carbonate or citrate) and 800 units of Vitamin D per day seems prudent. Certain women may benefit from higher intake of Vitamin D.  If you are among these women, your doctor will have told you during your visit.    PAP SMEARS:  Pap smears, to check for cervical cancer or precancers,  have traditionally been done yearly, although recent scientific advances have shown that most women can have pap smears less often.  However, every woman still should have a physical exam from her gynecologist every year. It will include a breast check, inspection of the vulva and vagina to check for abnormal growths or skin changes, a visual exam of the cervix, and then an exam to evaluate the size and shape of the uterus and  ovaries.  And after 46 years of age, a rectal exam is indicated to check for rectal cancers. We will also provide age appropriate advice regarding health maintenance, like when you should have certain vaccines, screening for sexually transmitted diseases, bone density testing, colonoscopy, mammograms, etc.   MAMMOGRAMS:  All women over 40 years old should have a yearly mammogram. Many facilities now offer a "3D" mammogram, which may cost around $50 extra out of pocket. If possible,  we recommend you accept the option to have the 3D mammogram performed.  It both reduces the number of women who will be called back for extra views which then turn out to be normal, and it is better than the routine mammogram at detecting truly abnormal areas.    COLONOSCOPY:  Colonoscopy to screen for colon cancer is recommended for all women at age 50.  We know, you hate the idea of the prep.  We agree, BUT, having colon cancer and not knowing it is worse!!  Colon cancer so often starts as a polyp that can be seen and removed at colonscopy, which can quite literally save your life!  And if your first colonoscopy is normal and you have no family history of colon cancer, most women don't have to have it again for 10 years.  Once every ten years, you can do something that may end up saving your life, right?  We will be happy to help you get it scheduled when you are ready.    Be sure to check your insurance coverage so you understand how much it will cost.  It may be covered as a preventative service at no cost, but you should check your particular policy.      Breast Self-Awareness Breast self-awareness means being familiar with how your breasts look and feel. It involves checking your breasts regularly and reporting any changes to your health care provider. Practicing breast self-awareness is important. A change in your breasts can be a sign of a serious medical problem. Being familiar with how your breasts look and feel allows  you to find any problems early, when treatment is more likely to be successful. All women should practice breast self-awareness, including women who have had breast implants. How to do a breast self-exam One way to learn what is normal for your breasts and whether your breasts are changing is to do a breast self-exam. To do a breast self-exam: Look for Changes  1. Remove all the clothing above your waist. 2. Stand in front of a mirror in a room with good lighting. 3. Put your hands on your hips. 4. Push your hands firmly downward. 5. Compare your breasts in the mirror. Look for differences between them (asymmetry), such as: ? Differences in shape. ? Differences in size. ? Puckers, dips, and bumps in one breast and not the other. 6. Look at each breast for changes in your skin, such as: ? Redness. ? Scaly areas. 7. Look for changes in your nipples, such as: ? Discharge. ? Bleeding. ? Dimpling. ? Redness. ? A change in position. Feel for Changes  Carefully feel your breasts for lumps and changes. It is best to do this while lying on your back on the floor and again while sitting or standing in the shower or tub with soapy water on your skin. Feel each breast in the following way:  Place the arm on the side of the breast you are examining above your head.  Feel your breast with the other hand.  Start in the nipple area and make  inch (2 cm) overlapping circles to feel your breast. Use the pads of your three middle fingers to do this. Apply light pressure, then medium pressure, then firm pressure. The light pressure will allow you to feel the tissue closest to the skin. The medium pressure will allow you to feel the tissue that is a little deeper. The firm pressure will allow you to feel the tissue close to the ribs.  Continue the overlapping circles, moving downward over the breast until you feel your ribs below your breast.  Move one finger-width toward the center of the body.  Continue to use the  inch (2 cm) overlapping circles to feel your breast as you move slowly up toward your collarbone.  Continue the up and down exam using all three pressures until you reach your armpit.  Write Down What You Find  Write down what is normal for each breast and any changes that you find. Keep a written record with breast changes or normal findings for each breast. By writing this information down, you do not need to depend only on memory for size, tenderness, or location. Write down where you are in your menstrual cycle, if you are still menstruating. If you are having trouble noticing differences in your breasts, do not get discouraged. With time you will become more familiar with the variations in your breasts and more comfortable with the exam. How often should I examine my breasts? Examine   your breasts every month. If you are breastfeeding, the best time to examine your breasts is after a feeding or after using a breast pump. If you menstruate, the best time to examine your breasts is 5-7 days after your period is over. During your period, your breasts are lumpier, and it may be more difficult to notice changes. When should I see my health care provider? See your health care provider if you notice:  A change in shape or size of your breasts or nipples.  A change in the skin of your breast or nipples, such as a reddened or scaly area.  Unusual discharge from your nipples.  A lump or thick area that was not there before.  Pain in your breasts.  Anything that concerns you.  This information is not intended to replace advice given to you by your health care provider. Make sure you discuss any questions you have with your health care provider. Document Released: 11/24/2005 Document Revised: 05/01/2016 Document Reviewed: 10/14/2015 Elsevier Interactive Patient Education  2018 [employer] Inc[employer]  

## 2018-08-06 LAB — CYTOLOGY - PAP
Diagnosis: NEGATIVE
HPV: NOT DETECTED

## 2018-08-12 MED FILL — LEVOTHYROXINE 112 MCG TAB: 112 | 30 days supply | Qty: 30 | Fill #4

## 2018-08-12 MED FILL — NAPROXEN 500 MG TABLET: 500 | 30 days supply | Qty: 60 | Fill #4

## 2018-08-31 MED FILL — CHLORHEXIDINE 0.12% RINSE: 0.12 | 16 days supply | Qty: 473 | Fill #0

## 2018-09-08 ENCOUNTER — Other Ambulatory Visit: Payer: Self-pay | Admitting: Family Medicine

## 2018-09-08 DIAGNOSIS — Z1231 Encounter for screening mammogram for malignant neoplasm of breast: Secondary | ICD-10-CM

## 2018-09-09 MED FILL — LEVOTHYROXINE 112 MCG TAB: 112 | 30 days supply | Qty: 30 | Fill #5

## 2018-09-09 MED FILL — HYDROCHLOROTHIAZIDE 25 MG T: 25 | 90 days supply | Qty: 90 | Fill #2

## 2018-09-09 MED FILL — LOSARTAN POTASSIUM 100 MG T: 100 | 90 days supply | Qty: 90 | Fill #2

## 2018-10-11 ENCOUNTER — Other Ambulatory Visit: Payer: Self-pay | Admitting: Endocrinology

## 2018-10-11 ENCOUNTER — Ambulatory Visit: Payer: 59

## 2018-10-11 DIAGNOSIS — E041 Nontoxic single thyroid nodule: Secondary | ICD-10-CM

## 2018-10-15 ENCOUNTER — Ambulatory Visit
Admission: RE | Admit: 2018-10-15 | Discharge: 2018-10-15 | Disposition: A | Discharge status: Home / Self Care | Payer: 59 | Source: Ambulatory Visit | Attending: Family Medicine | Admitting: Family Medicine

## 2018-10-15 DIAGNOSIS — Z1231 Encounter for screening mammogram for malignant neoplasm of breast: Secondary | ICD-10-CM | POA: Diagnosis not present

## 2018-10-25 MED FILL — LEVOTHYROXINE 112 MCG TAB: 112 | 30 days supply | Qty: 30 | Fill #6

## 2018-12-09 ENCOUNTER — Encounter: Payer: Self-pay | Admitting: Family Medicine

## 2018-12-23 MED FILL — NAPROXEN 500 MG TABLET: 500 | 30 days supply | Qty: 60 | Fill #0

## 2018-12-24 MED FILL — LEVOTHYROXINE 112 MCG TAB: 112 | 30 days supply | Qty: 30 | Fill #0

## 2019-01-18 ENCOUNTER — Ambulatory Visit (INDEPENDENT_AMBULATORY_CARE_PROVIDER_SITE_OTHER): Payer: Self-pay | Admitting: Family Medicine

## 2019-01-18 ENCOUNTER — Ambulatory Visit
Admission: EM | Admit: 2019-01-18 | Discharge: 2019-01-18 | Disposition: A | Discharge status: Home / Self Care | Payer: No Typology Code available for payment source | Attending: Nurse Practitioner | Admitting: Nurse Practitioner

## 2019-01-18 ENCOUNTER — Encounter: Payer: Self-pay | Admitting: Family Medicine

## 2019-01-18 VITALS — BP 110/85 | HR 89 | Temp 98.7°F | Resp 20

## 2019-01-18 DIAGNOSIS — R062 Wheezing: Secondary | ICD-10-CM

## 2019-01-18 DIAGNOSIS — J069 Acute upper respiratory infection, unspecified: Secondary | ICD-10-CM

## 2019-01-18 DIAGNOSIS — R6889 Other general symptoms and signs: Secondary | ICD-10-CM

## 2019-01-18 DIAGNOSIS — R05 Cough: Secondary | ICD-10-CM

## 2019-01-18 DIAGNOSIS — R059 Cough, unspecified: Secondary | ICD-10-CM

## 2019-01-18 MED ORDER — CHLORPHEN-PE-ACETAMINOPHEN 4-10-325 MG PO TABS: 1.0000 | ORAL_TABLET | Freq: Four times a day (QID) | ORAL | 0 refills | Status: DC | PRN

## 2019-01-18 MED ORDER — OSELTAMIVIR PHOSPHATE 75 MG PO CAPS: 75.0000 mg | ORAL_CAPSULE | Freq: Two times a day (BID) | ORAL | 0 refills | Status: AC

## 2019-01-18 MED ORDER — GUAIFENESIN 100 MG/5ML PO SYRP: 100.0000 mg | ORAL_SOLUTION | ORAL | 0 refills | Status: DC | PRN

## 2019-01-18 MED ORDER — ALBUTEROL SULFATE HFA 108 (90 BASE) MCG/ACT IN AERS: 1.0000 | INHALATION_SPRAY | Freq: Four times a day (QID) | RESPIRATORY_TRACT | 0 refills | Status: DC | PRN

## 2019-01-18 MED FILL — NOREL AD TABLET: 4-10-325 | 5 days supply | Qty: 21 | Fill #0

## 2019-01-18 MED FILL — VENTOLIN HFA 90 MCG INHALER: 108 (90 BAS | 25 days supply | Qty: 18 | Fill #0

## 2019-01-18 MED FILL — OSELTAMIVIR PHOSPHATE 75 MG: 75 | 5 days supply | Qty: 10 | Fill #0

## 2019-01-18 NOTE — ED Triage Notes (Signed)
Pt states went to [facility] this [facility] for cough and bodyaches, states received a neb tx with same wheezing afterwards, sent here for chest xray

## 2019-01-18 NOTE — Discharge Instructions (Addendum)
Take medications as prescribed. You should also take the tamiflu prescribed by the provider at [facility]. You can take tylenol and/or ibuprofen as needed for body aches and fevers. Drink plenty of fluids and rest.

## 2019-01-18 NOTE — Patient Instructions (Addendum)
Today: Follow up with your primary care for more comprehensive eval Tamiflu provided due to abrupt nature/onset of symptoms but only use if other conditions evaluated  Bronchospasm, Adult  Bronchospasm is a tightening of the airways going into the lungs. During an episode, it may be harder to breathe. You may cough, and you may make a whistling sound when you breathe (wheeze). This condition often affects people with asthma. What are the causes? This condition is caused by swelling and irritation in the airways. It can be triggered by:  An infection (common).  Seasonal allergies.  An allergic reaction.  Exercise.  Irritants. These include pollution, cigarette smoke, strong odors, aerosol sprays, and paint fumes.  Weather changes. Winds increase molds and pollens in the air. Cold air may cause swelling.  Stress and emotional upset. What are the signs or symptoms? Symptoms of this condition include:  Wheezing. If the episode was triggered by an allergy, wheezing may start right away or hours later.  Nighttime coughing.  Frequent or severe coughing with a simple cold.  Chest tightness.  Shortness of breath.  Decreased ability to exercise. How is this diagnosed? This condition is usually diagnosed with a review of your medical history and a physical exam. Tests, such as lung function tests, are sometimes done to look for other conditions. The need for a chest X-ray depends on where the wheezing occurs and whether it is the first time you have wheezed. How is this treated? This condition may be treated with:  Inhaled medicines. These open up the airways and help you breathe. They can be taken with an inhaler or a nebulizer device.  Corticosteroid medicines. These may be given for severe bronchospasm, usually when it is associated with asthma.  Avoiding triggers, such as irritants, infection, or allergies. Follow these instructions at home: Medicines  Take over-the-counter  and prescription medicines only as told by your health care provider.  If you need to use an inhaler or nebulizer to take your medicine, ask your health care provider to explain how to use it correctly. If you were given a spacer, always use it with your inhaler. Lifestyle  Reduce the number of triggers in your home. To do this: ? Change your heating and air conditioning filter at least once a month. ? Limit your use of fireplaces and wood stoves. ? Do not smoke. Do not allow smoking in your home. ? Avoid using perfumes and fragrances. ? Get rid of pests, such as roaches and mice, and their droppings. ? Remove any mold from your home. ? Keep your house clean and dust free. Use unscented cleaning products. ? Replace carpet with wood, tile, or vinyl flooring. Carpet can trap dander and dust. ? Use allergy-proof pillows, mattress covers, and box spring covers. ? Wash bed sheets and blankets every week in hot water. Dry them in a dryer. ? Use blankets that are made of polyester or cotton. ? Wash your hands often. ? Do not allow pets in your bedroom.  Avoid breathing in cold air when you exercise. General instructions  Have a plan for seeking medical care. Know when to call your health care provider and local emergency services, and where to get emergency care.  Stay up to date on your immunizations.  When you have an episode of bronchospasm, stay calm. Try to relax and breathe more slowly.  If you have asthma, make sure you have an asthma action plan.  Keep all follow-up visits as told by your health care  provider. This is important. Contact a health care provider if:  You have muscle aches.  You have chest pain.  The mucus that you cough up (sputum) changes from clear or white to yellow, green, gray, or bloody.  You have a fever.  Your sputum gets thicker. Get help right away if:  Your wheezing and coughing get worse, even after you take your prescribed medicines.  It gets  even harder to breathe.  You develop severe chest pain. Summary  Bronchospasm is a tightening of the airways going into the lungs.  During an episode of bronchospasm, you may have a harder time breathing. You may cough and make a whistling sound when you breathe (wheeze).  Avoid exposure to triggers such as smoke, dust, mold, animal dander, and fragrances.  When you have an episode of bronchospasm, stay calm. Try to relax and breathe more slowly. This information is not intended to replace advice given to you by your health care provider. Make sure you discuss any questions you have with your health care provider. Document Released: 11/27/2003 Document Revised: 11/20/2016 Document Reviewed: 11/20/2016 Elsevier Interactive Patient Education  2019 [employer].

## 2019-01-18 NOTE — ED Provider Notes (Signed)
[facility]    CSN: [id number] Arrival date & time: 01/18/19  1003     History   Chief Complaint Chief Complaint  Patient presents with  . Cough    HPI Pam Mckee is a 48 y.o. female.   Subjective:   Pam Mckee is a 48 y.o. female who presents for evaluation of influenza like symptoms. Symptoms include hot and cold spells, sweats, chest congestion, chills, headache, myalgias, productive cough, shortness of breath, sore throat and wheezing. Symptoms have been present for 1 day. She has tried to alleviate the symptoms with naproxen with minimal relief. High risk factors for influenza complications: co-morbid illness (hypertension, hypothyroidism). Patient has had flu vaccine this season. Denies any known flu contacts. Nonsmoker. [facility]. She was given a breathing treatment and a prescription for tamiflu. She was advised to come here for a CXR prior to starting the Tamiflu.   The following portions of the patient's history were reviewed and updated as appropriate: allergies, current medications, past family history, past medical history, past social history, past surgical history and problem list.        Past Medical History:  Diagnosis Date  . Anemia   . Hypertension   . Olecranon bursitis of right elbow 02/15/2015   Suspect this was an acute gout flare triggered by HCTZ since she had no trauma   . OSA on CPAP   . Primary osteoarthritis of left knee 05/18/2015   Nonweightbearing x-rays revealed mild degenerative changes.  Referred to [facility]  - saw [place] , [name]   . Seizures ([place])   . Thyroid disease    hypo  . Torn LEFT medial meniscus 04/2015   Left knee MRI confirmed but no instability sxs. Seen at [facility] 04/2016 to review treatment plan. sxs responded to cortisone injection summer 2016 and 04/2016 so continue prn. Consider arthroscopy if sxs become unresponsive.    Patient Active Problem List   Diagnosis Date  Noted  . Simple obesity 02/19/2018  . Family history of aneurysm 12/09/2015  . Left hip pain 07/10/2015  . Back pain with left-sided sciatica 07/10/2015  . Hypothyroidism 06/06/2015  . 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 Obstructive sleep apnea 05/18/2015  . Breast calcifications on mammogram 05/18/2015  . Menorrhagia with regular cycle 05/18/2015  . Chronic pain of left knee 04/23/2015  . Severe obesity (BMI >= 40) ([place]) 03/15/2015  . Thyroid activity decreased 03/15/2015  . Vitamin D deficiency 03/15/2015  . Essential hypertension 02/15/2015    Past Surgical History:  Procedure Laterality Date  . INTRAUTERINE DEVICE (IUD) INSERTION  06/2016    OB History    Gravida  2   Para  2   Term  1   Preterm  1   AB  0   Living  1     SAB  0   TAB  0   Ectopic  0   Multiple  0   Live Births               Home Medications    Prior to Admission medications   Medication Sig Start Date End Date Taking? Authorizing Provider  albuterol (PROVENTIL HFA;VENTOLIN HFA) 108 (90 Base) MCG/ACT inhaler Inhale 1-2 puffs into the lungs every 6 (six) hours as needed for wheezing or shortness of breath. 01/18/19   Enrique Sack, FNP  ALL DAY ALLERGY 10 MG tablet TAKE 1 TABLET BY MOUTH AT BEDTIME. Patient not taking: Reported  on 01/18/2019 12/11/16   Shawnee Knapp, MD  Chlorphen-PE-Acetaminophen (NOREL AD) 4-10-325 MG TABS Take 1 tablet by mouth 4 (four) times daily as needed (cold symptoms). 01/18/19   Enrique Sack, FNP  guaifenesin (ROBITUSSIN) 100 MG/5ML syrup Take 5-10 mLs (100-200 mg total) by mouth every 4 (four) hours as needed for cough. 01/18/19   Enrique Sack, FNP  hydrochlorothiazide (HYDRODIURIL) 25 MG tablet Take 1 tablet (25 mg total) by mouth daily. 03/08/18   Shawnee Knapp, MD  levonorgestrel (MIRENA) 20 MCG/24HR IUD 1 each by Intrauterine route once.    [provider]  levothyroxine (SYNTHROID, LEVOTHROID) 112 MCG tablet Take 1 tablet by mouth  daily. 11/06/16   [provider]  losartan (COZAAR) 100 MG tablet Take 1 tablet (100 mg total) by mouth daily. 03/08/18   Shawnee Knapp, MD  naproxen (NAPROSYN) 500 MG tablet TAKE 1 TABLET BY MOUTH EVERY 12 HOURS AS NEEDED FOR PAIN Patient not taking: Reported on 01/18/2019 03/08/18   Shawnee Knapp, MD  oseltamivir (TAMIFLU) 75 MG capsule Take 1 capsule (75 mg total) by mouth 2 (two) times daily for 5 days. 01/18/19 01/23/19  Shella Maxim, NP  phentermine (ADIPEX-P) 37.5 MG tablet Take 1 tablet (37.5 mg total) by mouth daily before breakfast. Or take 1/2 tab before breakfast and half tab before lunch Patient not taking: Reported on 01/18/2019 04/19/18   Shawnee Knapp, MD  polyethylene glycol powder (GLYCOLAX/MIRALAX) powder Mix 17g of powder (capful) into 8 oz or more of any clear liquid and drink po tid prn mild constipation Patient not taking: Reported on 01/18/2019 03/17/18   Shawnee Knapp, MD  Prenatal MV-Min-FA-Omega-3 (PRENATAL GUMMIES/DHA & FA PO) Take by mouth.    [provider]  Vitamin D, Ergocalciferol, (DRISDOL) 50000 units CAPS capsule Take 1 capsule (50,000 Units total) by mouth 2 (two) times a week. Patient not taking: Reported on 01/18/2019 03/18/18   Shawnee Knapp, MD    Family History Family History  Problem Relation Age of Onset  . Hypertension Mother   . CVA Mother        occured while on lovenox  . Hypertension Brother   . Aneurysm Brother   . Diabetes Paternal Grandfather   . Renal Disease Father   . Breast cancer Neg Hx     Social History Social History   Tobacco Use  . Smoking status: Never Smoker  . Smokeless tobacco: Never Used  Substance Use Topics  . Alcohol use: No    Alcohol/week: 0.0 standard drinks  . Drug use: No     Allergies   Patient has no known allergies.   Review of Systems Review of Systems  Constitutional: Positive for chills and diaphoresis. Negative for activity change, appetite change and fever.  HENT: Positive for congestion,  rhinorrhea and sore throat.   Eyes: Negative.   Respiratory: Positive for cough, shortness of breath and wheezing. Negative for chest tightness.   Cardiovascular: Negative for chest pain.  Gastrointestinal: Negative.   Musculoskeletal: Positive for myalgias.  Neurological: Negative.   All other systems reviewed and are negative.    Physical Exam Triage Vital Signs ED Triage Vitals [01/18/19 1018]  Enc Vitals Group     BP (!) 147/90     Pulse Rate 72     Resp 18     Temp 98.1 F (36.7 C)     Temp Source Oral     SpO2 98 %     Weight  Height      Head Circumference      Peak Flow      Pain Score 7     Pain Loc      Pain Edu?      Excl. in [place]?    No data found.  Updated Vital Signs BP (!) 147/90 (BP Location: Left Arm)   Pulse 72   Temp 98.1 F (36.7 C) (Oral)   Resp 18   SpO2 98%   Visual Acuity Right Eye Distance:   Left Eye Distance:   Bilateral Distance:    Right Eye Near:   Left Eye Near:    Bilateral Near:     Physical Exam Vitals signs and nursing note reviewed.  Constitutional:      General: She is not in acute distress.    Appearance: Normal appearance. She is not ill-appearing, toxic-appearing or diaphoretic.  HENT:     Head: Normocephalic.     Right Ear: Tympanic membrane, ear canal and external ear normal.     Left Ear: Tympanic membrane, ear canal and external ear normal.     Nose: Nose normal.     Mouth/Throat:     Mouth: Mucous membranes are moist.     Pharynx: Oropharynx is clear.  Eyes:     Extraocular Movements: Extraocular movements intact.     Conjunctiva/sclera: Conjunctivae normal.     Pupils: Pupils are equal, round, and reactive to light.  Neck:     Musculoskeletal: Normal range of motion and neck supple.  Cardiovascular:     Rate and Rhythm: Normal rate and regular rhythm.  Pulmonary:     Effort: Pulmonary effort is normal. No respiratory distress.     Breath sounds: Normal breath sounds. No wheezing, rhonchi or rales.   Musculoskeletal: Normal range of motion.  Lymphadenopathy:     Cervical: No cervical adenopathy.  Skin:    General: Skin is warm and dry.  Neurological:     General: No focal deficit present.     Mental Status: She is alert and oriented to person, place, and time.  Psychiatric:        Mood and Affect: Mood normal.        Behavior: Behavior normal.      UC Treatments / Results  Labs (all labs ordered are listed, but only abnormal results are displayed) Labs Reviewed - No data to display  EKG None  Radiology No results found.  Procedures Procedures (including critical care time)  Medications Ordered in UC Medications - No data to display  Initial Impression / Assessment and Plan / UC Course  I have reviewed the triage vital signs and the nursing notes.  Pertinent labs & imaging results that were available during my care of the patient were reviewed by me and considered in my medical decision making (see chart for details).     48 year old female presenting with less than a 24-hour history of hot/cold spells, sweats, chest congestion, chills, headache, myalgia, productive cough, shortness of breath, sore throat and wheezing.  He denies any known sick contacts.  She is a non-smoker.  Went to a another urgent care this morning.  Given a breathing treatment and prescription for Tamiflu but was told to come here for a chest x-ray prior to starting new medications.  On exam, patient is nontoxic-appearing.  Afebrile.  No wheezing or other adventitious lung sounds noted.  We will not do a chest x-ray at this time.  Supportive care advised for URI.  Advised to take the Tamiflu as prescribed.  We will also give her therapies to help support her current symptoms.  Discussed indications for immediate follow-up.  Patient verbalized understanding.   Today's evaluation has revealed no signs of a dangerous process. Discussed diagnosis with patient. Patient aware of their diagnosis, possible  red flag symptoms to watch out for and need for close follow up. Patient understands verbal and written discharge instructions. Patient comfortable with plan and disposition.  Patient has a clear mental status at this time, good insight into illness (after discussion and teaching) and has clear judgment to make decisions regarding their care.  Documentation was completed with the aid of voice recognition software. Transcription may contain typographical errors. Final Clinical Impressions(s) / UC Diagnoses   Final diagnoses:  Viral upper respiratory tract infection     Discharge Instructions     Take medications as prescribed. You should also take the tamiflu prescribed by the provider at [facility]. You can take tylenol and/or ibuprofen as needed for body aches and fevers. Drink plenty of fluids and rest.     ED Prescriptions    Medication Sig Dispense Auth. Provider   Chlorphen-PE-Acetaminophen (NOREL AD) 4-10-325 MG TABS Take 1 tablet by mouth 4 (four) times daily as needed (cold symptoms). 21 tablet Enrique Sack, FNP   guaifenesin (ROBITUSSIN) 100 MG/5ML syrup Take 5-10 mLs (100-200 mg total) by mouth every 4 (four) hours as needed for cough. 120 mL Enrique Sack, FNP   albuterol (PROVENTIL HFA;VENTOLIN HFA) 108 (90 Base) MCG/ACT inhaler Inhale 1-2 puffs into the lungs every 6 (six) hours as needed for wheezing or shortness of breath. [place], FNP     Controlled Substance Prescriptions [place] Controlled Substance Registry consulted? Not Applicable   Enrique Sack, [place] 01/18/19 1045

## 2019-01-18 NOTE — Progress Notes (Signed)
Pam Mckee is a 48 y.o. female who presents today with 2 days of cough, wheezing and body aches, she reports abrupt onset of symptoms. She has only used motrin for her symptoms and reports mild relief of body aches. She is otherwise healthy with chronic conditions that are under the care of her PCP and well controlled.      Review of Systems  Constitutional: Negative for chills, fever and malaise/fatigue.  HENT: Positive for sore throat. Negative for congestion, ear discharge, ear pain and sinus pain.   Eyes: Negative.   Respiratory: Positive for cough and wheezing. Negative for hemoptysis, sputum production and shortness of breath.   Cardiovascular: Negative.  Negative for chest pain.  Gastrointestinal: Negative for abdominal pain, diarrhea, nausea and vomiting.  Genitourinary: Negative for dysuria, frequency, hematuria and urgency.  Musculoskeletal: Positive for myalgias.  Skin: Negative.   Neurological: Negative for headaches.  Endo/Heme/Allergies: Negative.   Psychiatric/Behavioral: Negative.     Pam Mckee has a current medication list which includes the following prescription(s): hydrochlorothiazide, levonorgestrel, levothyroxine, losartan, all day allergy, naproxen, oseltamivir, phentermine, polyethylene glycol powder, prenatal mv-min-fa-omega-3, and vitamin d (ergocalciferol). Also has No Known Allergies.  Pam Mckee  has a past medical history of Anemia, Hypertension, Olecranon bursitis of right elbow (02/15/2015), OSA on CPAP, Primary osteoarthritis of left knee (05/18/2015), Seizures ([place]), Thyroid disease, and Torn LEFT medial meniscus (04/2015). Also  has a past surgical history that includes Intrauterine device (iud) insertion (06/2016).    O: Vitals:   01/18/19 0818  BP: 110/85  Pulse: 89  Resp: 20  Temp: 98.7 F (37.1 C)  SpO2: 99%     Physical Exam Vitals signs reviewed.  Constitutional:      General: She is not in acute distress.    Appearance: Normal appearance. She is  well-developed. She is not ill-appearing, toxic-appearing or diaphoretic.  HENT:     Head: Normocephalic.     Right Ear: Hearing, ear canal and external ear normal. A middle ear effusion is present.     Left Ear: Hearing, tympanic membrane, ear canal and external ear normal.     Nose: Congestion and rhinorrhea present.     Right Sinus: No maxillary sinus tenderness or frontal sinus tenderness.     Left Sinus: No maxillary sinus tenderness or frontal sinus tenderness.     Mouth/Throat:     Lips: Pink.     Mouth: Mucous membranes are moist.     Pharynx: Uvula midline.     Tonsils: No tonsillar exudate or tonsillar abscesses. Swelling: 2+ on the right. 2+ on the left.  Eyes:     General: Lids are normal.     Extraocular Movements: Extraocular movements intact.  Neck:     Musculoskeletal: Full passive range of motion without pain, normal range of motion and neck supple.  Cardiovascular:     Rate and Rhythm: Normal rate and regular rhythm.     Pulses: Normal pulses.     Heart sounds: Normal heart sounds.  Pulmonary:     Effort: Pulmonary effort is normal. No tachypnea, accessory muscle usage, prolonged expiration, respiratory distress or retractions.     Breath sounds: Normal air entry. No stridor, decreased air movement or transmitted upper airway sounds. Examination of the right-upper field reveals wheezing. Examination of the right-middle field reveals wheezing. Examination of the left-middle field reveals wheezing. Examination of the left-lower field reveals wheezing. Wheezing present. No decreased breath sounds, rhonchi or rales.  Abdominal:     General: Bowel sounds  are normal.     Palpations: Abdomen is soft.  Musculoskeletal: Normal range of motion.  Lymphadenopathy:     Head:     Right side of head: Tonsillar adenopathy present. No submental or submandibular adenopathy.     Left side of head: Tonsillar adenopathy present. No submental or submandibular adenopathy.     Cervical:  Cervical adenopathy present.     Right cervical: Superficial cervical adenopathy present.     Left cervical: Superficial cervical adenopathy present.  Skin:    General: Skin is warm.  Neurological:     Mental Status: She is alert and oriented to person, place, and time.  Psychiatric:        Mood and Affect: Mood normal.        Behavior: Behavior is cooperative.      A: 1. Wheezing   2. Cough   3. Flu-like symptoms      P: Patient referred to a higher level of care with increased diagnostic ability.  Patient with no asthma history with abrupt onset body aches, chills and wheezing with occasional cough. Unsure what is actually causing these symptoms in patient who at this point would benefit I feel from a chest- x-ray. She was advised to seek care today at a local urgent care or her PCP for comprehensive evaluation with expanded diagnostic capabilities.  1. Wheezing In clinic albuterol treatment x 1 with no improvement post treatment to wheezing or patient perceived breathing. Pt tolerated well- pre treatment vitals WNL-post treatment condition unchanged.  2. Cough Additional evaluation-see plan.  3. Flu-like symptoms Potentially abrupt onset of symptoms could be related to influenza but negative fever and POCT- tamiflu provided but patient instructed not to take before having comprehensive workup related to her condition.  - oseltamivir (TAMIFLU) 75 MG capsule; Take 1 capsule (75 mg total) by mouth 2 (two) times daily for 5 days.    Discussed with patient exam findings, suspected diagnosis etiology and  reviewed recommended treatment plan and follow up, including complications and indications for urgent medical follow up and evaluation. Medications including use and indications reviewed with patient. Patient provided relevant patient education on diagnosis and/or relevant related condition that were discussed and reviewed with patient at discharge. Patient verbalized  understanding of information provided and agrees with plan of care (POC), all questions answered.

## 2019-01-20 ENCOUNTER — Telehealth: Payer: Self-pay

## 2019-01-20 NOTE — Telephone Encounter (Signed)
Patient did not answered the phone. I left a message asking to call us back.  

## 2019-01-21 MED FILL — LEVOTHYROXINE 112 MCG TAB: 112 | 30 days supply | Qty: 30 | Fill #1

## 2019-01-28 MED FILL — NAPROXEN 500 MG TABLET: 500 | 30 days supply | Qty: 60 | Fill #1

## 2019-02-08 ENCOUNTER — Telehealth: Payer: Self-pay | Admitting: Family Medicine

## 2019-02-08 NOTE — Telephone Encounter (Signed)
mychart message sent to pt about their appointment with Dr Shaw °

## 2019-02-22 MED FILL — LEVOTHYROXINE 112 MCG TAB: 112 | 30 days supply | Qty: 30 | Fill #2 | Status: TO

## 2019-02-24 ENCOUNTER — Ambulatory Visit: Payer: No Typology Code available for payment source | Admitting: Registered"

## 2019-02-25 MED FILL — HYDROCHLOROTHIAZIDE 25 MG T: 25 | 90 days supply | Qty: 90 | Fill #3

## 2019-02-25 MED FILL — LOSARTAN POTASSIUM 100 MG T: 100 | 90 days supply | Qty: 90 | Fill #3

## 2019-02-25 MED FILL — NAPROXEN 500 MG TABLET: 500 | 30 days supply | Qty: 60 | Fill #2

## 2019-03-09 ENCOUNTER — Ambulatory Visit: Payer: No Typology Code available for payment source | Admitting: Registered"

## 2019-03-10 ENCOUNTER — Encounter: Payer: 59 | Admitting: Family Medicine

## 2019-03-17 MED FILL — LEVOTHYROXINE 112 MCG TAB: 112 | 30 days supply | Qty: 30 | Fill #0

## 2019-04-16 MED FILL — LEVOTHYROXINE 112 MCG TAB: 112 | 30 days supply | Qty: 30 | Fill #1

## 2019-04-22 ENCOUNTER — Ambulatory Visit: Payer: No Typology Code available for payment source | Admitting: Registered"

## 2019-04-26 ENCOUNTER — Other Ambulatory Visit: Payer: Self-pay

## 2019-04-26 ENCOUNTER — Encounter: Payer: No Typology Code available for payment source | Attending: Family Medicine | Admitting: Registered"

## 2019-04-26 VITALS — Ht 65.75 in | Wt 271.5 lb

## 2019-04-26 DIAGNOSIS — Z713 Dietary counseling and surveillance: Secondary | ICD-10-CM | POA: Insufficient documentation

## 2019-04-26 NOTE — Progress Notes (Signed)
[facility] Employee Visit 1 of 3  Appt start time: 0810 end time:  0910.  Assessment:  Primary concerns today: Insurance requirements for BMI. Employee states she wants to lose 10 lbs within the 3 visits. Employee states she tried Phentermine, but it didn't work.   Employee states blood work looked fine last year, but her next well visit is delayed this year due to [place]. A1c 5.7% last year.  Employee reports she is staying active, but her eating is not consistent. Pt states she started drinking sweetened coffee 3x week 3 again, but hasn't had in 1 mo. States she found [employer] instant 130 cal. Employee states she feels need for candy to pick up when working and does better on days off.   Has a hard time resisting all the sugar, donuts and other snacks that are being brought into the office. Employee reports that she brings her own healthier snacks such as peanut protein bar, but doesn't eat it.  Next week will be back to regular work place and plans to have a feast with coworkers she hasn't seen in [place].   Next week will share the radish study to help illustrate exhaustible source of willpower.  Preferred Learning Style:   No preference indicated   Learning Readiness:   Change in progress  MEDICATIONS: Reviewed (not taking Vit D, still has high dose left at home)   DIETARY INTAKE:  24-hr recall:  B ( AM): cereal frosted flakes OR 2 eggs, cheese, bacon or sausage, hot tea. Work days: bagel, egg, cheese Snk ( AM): 2-3 mini choc candy  L ( PM): burgers orders out with Snk ( PM): donuts at work or fruit D ( PM): Salmon, spinach, rice OR chicken wings (air fryer) OR steak, rice, occassionally Snk ( PM): juice, peace cobbler. Beverages: 32 oz water, 3-4 x/week 12 oz or 16 oz Gingerale lunch, same days as coffee.  Usual physical activity: not assessed  Estimated energy needs: 1600-1800 calories  Progress Towards Goal(s):  New goal.     Intervention:  Nutrition Education.  Mindful/mindless eating. Emotional and non-hunger eating.  Plan: Consider trying veggie burger Consider talking to manager about improving the snack options. Consider trying vitamin c drop in for an afternoon pick-me-up Consider taking OTC vit D consistently  Goals for next visit in 2 weeks: Cold [place] without coffee, tea instead Maybe no gingerale, if having, have less Bring snacks to work that will help you resist food at work. Apples and peanut butter is one idea. Spend time thinking about other snacks.  Teaching Method Utilized:  Visual Auditory Hands on  Handouts given during visit include:  none  Barriers to learning/adherence to lifestyle change: none  Demonstrated degree of understanding via:  Teach Back   Monitoring/Evaluation:  Dietary intake, exercise, and body weight in 2 week(s).

## 2019-04-26 NOTE — Patient Instructions (Addendum)
Consider trying veggie burger Consider talking to manager about improving the snack options. Consider trying vitamin c drop in for an afternoon pick-me-up Consider taking OTC vit D consistently  Goals for next visit in 2 weeks: Cold [place] without coffee, tea instead Maybe no gingerale, if having, have less Bring snacks to work that will help you resist food at work. Apples and peanut butter is one idea. Spend time thinking about other snacks.

## 2019-05-03 ENCOUNTER — Ambulatory Visit: Payer: No Typology Code available for payment source | Admitting: Registered"

## 2019-05-10 ENCOUNTER — Ambulatory Visit
Admission: RE | Admit: 2019-05-10 | Discharge: 2019-05-10 | Disposition: A | Discharge status: Home / Self Care | Payer: No Typology Code available for payment source | Source: Ambulatory Visit | Attending: Endocrinology | Admitting: Endocrinology

## 2019-05-10 DIAGNOSIS — E041 Nontoxic single thyroid nodule: Secondary | ICD-10-CM

## 2019-05-24 ENCOUNTER — Encounter: Payer: No Typology Code available for payment source | Attending: Family Medicine | Admitting: Registered"

## 2019-05-24 ENCOUNTER — Other Ambulatory Visit: Payer: Self-pay

## 2019-05-24 DIAGNOSIS — Z713 Dietary counseling and surveillance: Secondary | ICD-10-CM | POA: Insufficient documentation

## 2019-05-24 NOTE — Patient Instructions (Signed)
Continue to walk as tolerated Remember to set realistic goals Remember to celebrate your success [email or web address]-exercises/[email or web address]-script/ Remember that the scale is not an accurate measurement of success

## 2019-05-24 NOTE — Progress Notes (Signed)
[facility] Employee Visit 2 of 3  Appt start time: 0810 end time:  25.  Assessment:  Primary concerns today: Insurance requirements for BMI.   Employee was disappointed that she has not lost weight. She asked about keto diet, laser sculpting (wants to lose belly fat), and bariatric surgery. Weight loss desire seems to be d/t wanting to look better. RD stress importance of keeping health goals in mind.  States for the last 3 weeks has made changes drinking more water, stopped drinking ginger ale, cut out chips and cookies. Employee is still having [employer] sweetened coffee 3x week but using less sugar, moca and cream than before.  Employee states she was in [facility] the other day and almost bought some cheese doodles planning to eat while watching TV but because they seemed expensive for a little bag, decided she would just have popcorn. After she got home she forgot about the cheese doodles and didn't feel like popcorn either, just had some water and watched the movie.  Employee reports walking 2.5 mi for past 1.5 weeks and monitors heart rate, stays mostly at a moderate pace. Likes to sing while walking and feels good to clear her mind.   Employee states blood work looked fine last year, her next well visit is July 16, RD suggested she schedule visit 3 after her wellness visit.  Preferred Learning Style:   No preference indicated   Learning Readiness:   Change in progress  MEDICATIONS: Reviewed    DIETARY INTAKE: not assessed this visit  24-hr recall:  B ( AM):  Snk ( AM):   L ( PM): Snk ( PM):  D ( PM):  Snk ( PM):  Beverages:   Usual physical activity: not assessed  Estimated energy needs: 1600-1800 calories  Progress Towards Goal(s):  In Progress.     Intervention:  Nutrition Education. Reviewed Mindful eating - raisin meditation. Discussed how the same decision made with different perceptions can affect outcomes - responding to cravings with restrictive vs delay.  Restrictive often leading to eventual bingeing, delay has less of emotional response. Discussed realistic goal setting.  Plan: Continue to walk as tolerated Remember to set realistic goals Remember to celebrate your success [email or web address]-exercises/eating-[email or web address]-[email or web address]-script/ Remember that the scale is not an accurate measurement of success  Teaching Method Utilized:  Visual Auditory Hands on  Handouts given during visit include:  none  Barriers to learning/adherence to lifestyle change: none  Demonstrated degree of understanding via:  Teach Back   Monitoring/Evaluation:  Dietary intake, exercise, and body weight in 6 week(s).

## 2019-06-07 MED FILL — LEVOTHYROXINE 112 MCG TAB: 112 | 30 days supply | Qty: 30 | Fill #0

## 2019-06-08 ENCOUNTER — Ambulatory Visit: Payer: No Typology Code available for payment source | Admitting: Registered"

## 2019-06-23 ENCOUNTER — Ambulatory Visit (INDEPENDENT_AMBULATORY_CARE_PROVIDER_SITE_OTHER): Payer: No Typology Code available for payment source | Admitting: Family Medicine

## 2019-06-23 ENCOUNTER — Encounter: Payer: Self-pay | Admitting: Family Medicine

## 2019-06-23 ENCOUNTER — Other Ambulatory Visit: Payer: Self-pay

## 2019-06-23 VITALS — BP 130/80 | HR 91 | Temp 98.3°F | Ht 65.75 in | Wt 270.0 lb

## 2019-06-23 DIAGNOSIS — E559 Vitamin D deficiency, unspecified: Secondary | ICD-10-CM

## 2019-06-23 DIAGNOSIS — R7303 Prediabetes: Secondary | ICD-10-CM

## 2019-06-23 DIAGNOSIS — I10 Essential (primary) hypertension: Secondary | ICD-10-CM | POA: Diagnosis not present

## 2019-06-23 DIAGNOSIS — Z0001 Encounter for general adult medical examination with abnormal findings: Secondary | ICD-10-CM | POA: Diagnosis not present

## 2019-06-23 DIAGNOSIS — Z0000 Encounter for general adult medical examination without abnormal findings: Secondary | ICD-10-CM

## 2019-06-23 MED ORDER — LOSARTAN POTASSIUM 100 MG PO TABS: 100.0000 mg | ORAL_TABLET | Freq: Every day | ORAL | 3 refills | Status: DC

## 2019-06-23 MED ORDER — HYDROCHLOROTHIAZIDE 25 MG PO TABS: 25.0000 mg | ORAL_TABLET | Freq: Every day | ORAL | 3 refills | Status: DC

## 2019-06-23 MED FILL — LOSARTAN POTASSIUM 100 MG T: 100 | 90 days supply | Qty: 90 | Fill #0

## 2019-06-23 MED FILL — HYDROCHLOROTHIAZIDE 25 MG T: 25 | 90 days supply | Qty: 90 | Fill #0

## 2019-06-23 NOTE — Progress Notes (Signed)
7/16/20202:34 PM  Pam Mckee 1971-09-12, 48 y.o., female [id number]  Chief Complaint  Patient presents with  . Annual Exam    receives pap in Sept 2020, pain from sciatica is now on the right side radiating down to the leg    HPI:   Patient is a 48 y.o. female with past medical history significant for HTN, OSA, HLP, iron, vitamin D and b12 deficiency, hypothyroidism, thyroid nodules, prediabetes who presents today for CPE  Last CPE April 2019 Cervical Cancer Screening: April 2019, Dr Talbert Nan, [place], has IUD Breast Cancer Screening: nov 2019, negative Colorectal Cancer Screening: no fhx of colon cancer Bone Density Testing: at age 72 HIV Screening: thinks 2 years ago, thru gyn STI Screening: thru gyn  Most Recent Immunizations  Administered Date(s) Administered  . Hepatitis B 05/08/2013  . Influenza Split 09/07/2014  . Influenza, Seasonal, Injecte, Preservative Fre 09/03/2015  . Influenza,inj,Quad PF,6+ Mos 08/25/2016  . Tdap 12/09/2011   Pneumococcal Vaccination: at age 61 Zoster Vaccination: at age 62 Frequency of Dental evaluation: Q6 months, Smiles Frequency of Eye evaluation: wears contact, [facility]  Sees Dr Chalmers Cater for thyroid issues, stable [place] June 2020   Lab Results  Component Value Date   WBC 9.0 03/08/2018   HGB 11.7 03/08/2018   HCT 37.4 03/08/2018   MCV 81 03/08/2018   PLT 353 03/08/2018   Lab Results  Component Value Date   CHOL 195 03/08/2018   HDL 48 03/08/2018   LDLCALC 134 (H) 03/08/2018   TRIG 66 03/08/2018   CHOLHDL 4.1 03/08/2018   Lab Results  Component Value Date   TSH 1.283 10/25/2015    Depression screen PHQ 2/9 06/23/2019 04/19/2018 03/30/2018  Decreased Interest 0 0 0  Down, Depressed, Hopeless 0 0 0  PHQ - 2 Score 0 0 0    Fall Risk  06/23/2019 04/19/2018 03/30/2018 03/08/2018 02/19/2018  Falls in the past year? 0 No No No No  Number falls in past yr: 0 - - - -  Injury with Fall? 0 - - - -  Risk for fall due to : - - - - -    No Known Allergies  Prior to Admission medications   Medication Sig Start Date End Date Taking? Authorizing Provider  hydrochlorothiazide (HYDRODIURIL) 25 MG tablet Take 1 tablet (25 mg total) by mouth daily. 03/08/18  Yes Shawnee Knapp, MD  levonorgestrel (MIRENA) 20 MCG/24HR IUD 1 each by Intrauterine route once.   Yes [provider]  levothyroxine (SYNTHROID, LEVOTHROID) 112 MCG tablet Take 1 tablet by mouth daily. 11/06/16  Yes [provider]  losartan (COZAAR) 100 MG tablet Take 1 tablet (100 mg total) by mouth daily. 03/08/18  Yes Shawnee Knapp, MD  naproxen (NAPROSYN) 500 MG tablet TAKE 1 TABLET BY MOUTH EVERY 12 HOURS AS NEEDED FOR PAIN 03/08/18  Yes Shawnee Knapp, MD  Prenatal MV-Min-FA-Omega-3 (PRENATAL GUMMIES/DHA & FA PO) Take by mouth.   Yes [provider]  Vitamin D, Ergocalciferol, (DRISDOL) 50000 units CAPS capsule Take 1 capsule (50,000 Units total) by mouth 2 (two) times a week. 03/18/18  Yes Shawnee Knapp, MD    Past Medical History:  Diagnosis Date  . Anemia   . Hypertension   . Olecranon bursitis of right elbow 02/15/2015   Suspect this was an acute gout flare triggered by HCTZ since she had no trauma   . OSA on CPAP   . Primary osteoarthritis of left knee 05/18/2015   Nonweightbearing  x-rays revealed mild degenerative changes.  Referred to [facility]  - saw [place] , [name]   . Seizures ([place])   . Thyroid disease    hypo  . Torn LEFT medial meniscus 04/2015   Left knee MRI confirmed but no instability sxs. Seen at [facility] 04/2016 to review treatment plan. sxs responded to cortisone injection summer 2016 and 04/2016 so continue prn. Consider arthroscopy if sxs become unresponsive.    Past Surgical History:  Procedure Laterality Date  . INTRAUTERINE DEVICE (IUD) INSERTION  06/2016    Social History   Tobacco Use  . Smoking status: Never Smoker  . Smokeless tobacco: Never Used  Substance Use Topics  . Alcohol use: No     Alcohol/week: 0.0 standard drinks    Family History  Problem Relation Age of Onset  . Hypertension Mother   . CVA Mother        occured while on lovenox  . Hypertension Brother   . Aneurysm Brother   . Diabetes Paternal Grandfather   . Renal Disease Father   . Breast cancer Neg Hx     Review of Systems  Constitutional: Negative for chills and fever.  Respiratory: Negative for cough and shortness of breath.   Cardiovascular: Negative for chest pain, palpitations and leg swelling.  Gastrointestinal: Positive for abdominal pain (intermittent, LLQ, sharp, seconds, no triggers). Negative for blood in stool, constipation, diarrhea, heartburn, melena, nausea and vomiting.  Genitourinary: Negative for dysuria and hematuria.       Neg breast lumps or nipple discharge Neg vaginal discharge, pelvic pain, dyspareunia, abnormal vaginal bleeding  All other systems reviewed and are negative.    OBJECTIVE:  Today's Vitals   06/23/19 1426  BP: 130/80  Pulse: 91  Temp: 98.3 F (36.8 C)  TempSrc: Oral  SpO2: 97%  Weight: 270 lb (122.5 kg)  Height: 5' 5.75" (1.67 m)   Body mass index is 43.91 kg/m.   Physical Exam Vitals signs and nursing note reviewed.  Constitutional:      Appearance: She is well-developed.  HENT:     Head: Normocephalic and atraumatic.     Right Ear: Hearing, tympanic membrane, ear canal and external ear normal.     Left Ear: Hearing, tympanic membrane, ear canal and external ear normal.     Mouth/Throat:     Mouth: Mucous membranes are moist.     Pharynx: No oropharyngeal exudate or posterior oropharyngeal erythema.  Eyes:     Extraocular Movements: Extraocular movements intact.     Conjunctiva/sclera: Conjunctivae normal.     Pupils: Pupils are equal, round, and reactive to light.  Neck:     Musculoskeletal: Neck supple.     Thyroid: No thyromegaly.  Cardiovascular:     Rate and Rhythm: Normal rate and regular rhythm.     Heart sounds: Normal heart  sounds. No murmur. No friction rub. No gallop.   Pulmonary:     Effort: Pulmonary effort is normal.     Breath sounds: Normal breath sounds. No wheezing, rhonchi or rales.  Abdominal:     General: Bowel sounds are normal. There is no distension.     Palpations: Abdomen is soft. There is no hepatomegaly, splenomegaly or mass.     Tenderness: There is no abdominal tenderness.  Musculoskeletal: Normal range of motion.     Right lower leg: No edema.     Left lower leg: No edema.  Lymphadenopathy:     Cervical: No cervical adenopathy.  Skin:  General: Skin is warm and dry.  Neurological:     General: No focal deficit present.     Mental Status: She is alert and oriented to person, place, and time.     Cranial Nerves: No cranial nerve deficit.     Gait: Gait normal.     Deep Tendon Reflexes: Reflexes are normal and symmetric.  Psychiatric:        Mood and Affect: Mood normal.        Behavior: Behavior normal.     ASSESSMENT and PLAN  1. Annual physical exam Routine HCM labs ordered. HCM reviewed/discussed. Anticipatory guidance regarding healthy weight, lifestyle and choices given.   2. Vitamin D deficiency - VITAMIN D 25 Hydroxy (Vit-D Deficiency, Fractures)  3. Essential hypertension Controlled. Continue current regime.  - Lipid panel - CMP14+EGFR  4. Prediabetes - Hemoglobin A1c  Discussed clinical monitoring of LLQ pain, RTC precautions reviewed.  Other orders - losartan (COZAAR) 100 MG tablet; Take 1 tablet (100 mg total) by mouth daily. - hydrochlorothiazide (HYDRODIURIL) 25 MG tablet; Take 1 tablet (25 mg total) by mouth daily.  Return in about 1 year (around 06/22/2020).    Rutherford Guys, MD Primary Care at [facility] [place], [place] [address] Ph.  [phone] Fax [phone]

## 2019-06-23 NOTE — Patient Instructions (Signed)

## 2019-06-24 LAB — CMP14+EGFR
ALT: 14 IU/L (ref 0–32)
AST: 18 IU/L (ref 0–40)
Albumin/Globulin Ratio: 1.3 (ref 1.2–2.2)
Albumin: 4.1 g/dL (ref 3.8–4.8)
Alkaline Phosphatase: 89 IU/L (ref 39–117)
BUN/Creatinine Ratio: 23 (ref 9–23)
BUN: 19 mg/dL (ref 6–24)
Bilirubin Total: 0.2 mg/dL (ref 0.0–1.2)
CO2: 24 mmol/L (ref 20–29)
Calcium: 9.5 mg/dL (ref 8.7–10.2)
Chloride: 103 mmol/L (ref 96–106)
Creatinine, Ser: 0.82 mg/dL (ref 0.57–1.00)
GFR calc Af Amer: 99 mL/min/{1.73_m2} (ref >59)
GFR calc non Af Amer: 85 mL/min/{1.73_m2} (ref >59)
Globulin, Total: 3.1 g/dL (ref 1.5–4.5)
Glucose: 104 mg/dL — ABNORMAL HIGH (ref 65–99)
Potassium: 3.8 mmol/L (ref 3.5–5.2)
Sodium: 141 mmol/L (ref 134–144)
Total Protein: 7.2 g/dL (ref 6.0–8.5)

## 2019-06-24 LAB — LIPID PANEL
Chol/HDL Ratio: 5.7 ratio — ABNORMAL HIGH (ref 0.0–4.4)
Cholesterol, Total: 241 mg/dL — ABNORMAL HIGH (ref 100–199)
HDL: 42 mg/dL (ref >39)
LDL Calculated: 168 mg/dL — ABNORMAL HIGH (ref 0–99)
Triglycerides: 154 mg/dL — ABNORMAL HIGH (ref 0–149)
VLDL Cholesterol Cal: 31 mg/dL (ref 5–40)

## 2019-06-24 LAB — HEMOGLOBIN A1C
Est. average glucose Bld gHb Est-mCnc: 123 mg/dL
Hgb A1c MFr Bld: 5.9 % — ABNORMAL HIGH (ref 4.8–5.6)

## 2019-06-24 LAB — VITAMIN D 25 HYDROXY (VIT D DEFICIENCY, FRACTURES): Vit D, 25-Hydroxy: 33.4 ng/mL (ref 30.0–100.0)

## 2019-06-28 ENCOUNTER — Ambulatory Visit: Payer: No Typology Code available for payment source | Admitting: Registered"

## 2019-06-30 ENCOUNTER — Other Ambulatory Visit: Payer: Self-pay

## 2019-06-30 ENCOUNTER — Encounter: Payer: Self-pay | Admitting: Family Medicine

## 2019-06-30 ENCOUNTER — Encounter: Payer: No Typology Code available for payment source | Attending: Family Medicine | Admitting: Registered"

## 2019-06-30 DIAGNOSIS — Z713 Dietary counseling and surveillance: Secondary | ICD-10-CM | POA: Insufficient documentation

## 2019-06-30 NOTE — Progress Notes (Signed)
[facility] Employee Visit 3 of 3  Appt start time: 0930 end time:  1000.  Assessment:  Primary concerns today: Insurance requirements for BMI.   Employee states she has been doing good with breakfast. Was happy that she is maintaining weight loss. Going to start Premier Protein meal replacement. Employee stopped drinking ginger ale. Still drinks apple juice  Employee had wellness visit 7/16. Reviewed lab work with her. D/t increased A1c discussed ADA evidence of efficacy of starting metformin during pre-diabetes to help delay or prevent T2DM.  Employee states has been doing well with walking 3x/week. Instead of increasing time encouraged to increase by another day.  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   Employee states these visits are beneficial to keep her on track and asked how often she can have follow-up appointments. Advised her to call her insurance plan and let them know she has pre-diabetes which might make a difference.  Preferred Learning Style:   No preference indicated   Learning Readiness:   Change in progress  MEDICATIONS: Reviewed    DIETARY INTAKE: not assessed this visit  24-hr recall:  B ( AM): 2 eggs, bacon Snk ( AM):  Crackers OR kind peanut bar OR fruit L ( PM): sandwich OR rice & chicken Snk ( PM): none D ( PM): spaghetti, garlic knot or toast, sometimes salad Snk ( PM): none Beverages: 3x week dunkin donuts, water  Usual physical activity: 3x/week 2.5 mi  Estimated energy needs: 1600-1800 calories  Progress Towards Goal(s):  In Progress.     Intervention:  Nutrition Education. Discussed balanced eating with ideas to keep carbs balanced d/t pre-diabetes. Discussed how to get benefit from exercise. Also vitamin D supplementation.  Plan: Consider asking your doctor about value of metformin to prevent developing T2DM Consider adding 1 more day per week of walking (4x week) Consider taking  2,000 u vitamin D to prevent from going low again  Teaching Method Utilized:  Visual Auditory Hands on  Handouts given during visit include:  MyPlate  Barriers to learning/adherence to lifestyle change: none  Demonstrated degree of understanding via:  Teach Back   Monitoring/Evaluation:  Dietary intake, exercise, and body weight in 6 week(s).

## 2019-06-30 NOTE — Patient Instructions (Addendum)
Consider asking your doctor about value of metformin to prevent developing T2DM Consider adding 1 more day per week of walking (4x week) Consider taking 2,000 u vitamin D to prevent from going low again

## 2019-07-05 MED FILL — LEVOTHYROXINE 112 MCG TAB: 112 | 30 days supply | Qty: 30 | Fill #1

## 2019-08-08 MED FILL — LEVOTHYROXINE 112 MCG TAB: 112 | 90 days supply | Qty: 90 | Fill #0

## 2019-08-09 NOTE — Progress Notes (Signed)
48 y.o. G48P1101 Single Black or African American Not Hispanic or Latino female here for annual exam.   She has a mirena IUD, placed in 7/17. No bleeding.  She is sexually active, new relationship. Together x 6 months. No dyspareunia. Not always using condoms.     No LMP recorded. (Menstrual status: IUD).          Sexually active: Yes.    The current method of family planning is IUD.    Exercising: Yes.    walking 1/2 hour twice a day Smoker:  no  Health Maintenance: Pap:  05/18/2015 WNL, 08/04/2018 WNL NEG HPV History of abnormal Pap:  no MMG: 10/15/2018 Bir[place] 1 negative Colonoscopy:  n/a BMD:   n/a TDaP:  2013 Gardasil: n/a   reports that she has never smoked. She has never used smokeless tobacco. She reports that she does not drink alcohol or use drugs. She works as a Occupational psychologist. Son is 4 lives in [place]  Past Medical History:  Diagnosis Date  . Anemia   . Elevated lipids   . Hypertension   . Olecranon bursitis of right elbow 02/15/2015   Suspect this was an acute gout flare triggered by HCTZ since she had no trauma   . OSA on CPAP   . Prediabetes   . Primary osteoarthritis of left knee 05/18/2015   Nonweightbearing x-rays revealed mild degenerative changes.  Referred to [facility]  - saw [place] , [name]   . Seizures ([place])   . Thyroid disease    hypo  . Torn LEFT medial meniscus 04/2015   Left knee MRI confirmed but no instability sxs. Seen at [facility] 04/2016 to review treatment plan. sxs responded to cortisone injection summer 2016 and 04/2016 so continue prn. Consider arthroscopy if sxs become unresponsive.    Past Surgical History:  Procedure Laterality Date  . INTRAUTERINE DEVICE (IUD) INSERTION  06/2016    Current Outpatient Medications  Medication Sig Dispense Refill  . Ascorbic Acid (VITAMIN C PO) Take by mouth.    . ELDERBERRY PO Take by mouth.    . hydrochlorothiazide (HYDRODIURIL) 25 MG tablet Take 1 tablet (25 mg total) by mouth daily. 90 tablet  3  . levonorgestrel (MIRENA) 20 MCG/24HR IUD 1 each by Intrauterine route once.    Marland Kitchen levothyroxine (SYNTHROID, LEVOTHROID) 112 MCG tablet Take 1 tablet by mouth daily.  4  . losartan (COZAAR) 100 MG tablet Take 1 tablet (100 mg total) by mouth daily. 90 tablet 3  . naproxen (NAPROSYN) 500 MG tablet TAKE 1 TABLET BY MOUTH EVERY 12 HOURS AS NEEDED FOR PAIN 60 tablet 2  . Prenatal MV-Min-FA-Omega-3 (PRENATAL GUMMIES/DHA & FA PO) Take by mouth.     No current facility-administered medications for this visit.     Family History  Problem Relation Age of Onset  . Hypertension Mother   . CVA Mother        occured while on lovenox  . Hypertension Brother   . Aneurysm Brother   . Diabetes Brother   . Diabetes Paternal Grandfather   . Renal Disease Father   . Breast cancer Neg Hx     Review of Systems  Constitutional: Negative.   HENT: Negative.   Eyes: Negative.   Respiratory: Negative.   Cardiovascular: Negative.   Gastrointestinal: Negative.   Endocrine: Negative.   Genitourinary: Negative.   Musculoskeletal: Negative.   Skin: Negative.   Allergic/Immunologic: Negative.   Neurological: Negative.   Hematological: Negative.   Psychiatric/Behavioral: Negative.  Exam:   BP 124/82 (BP Location: Right Arm, Patient Position: Sitting, Cuff Size: Large)   Pulse 76   Temp (!) 97.1 F (36.2 C) (Skin)   Ht 5\' 6"  (1.676 m)   Wt 274 lb (124.3 kg)   BMI 44.22 kg/m   Weight change: @WEIGHTCHANGE @ Height:   Height: 5\' 6"  (167.6 cm)  Ht Readings from Last 3 Encounters:  08/11/19 5\' 6"  (1.676 m)  06/23/19 5' 5.75" (1.67 m)  04/26/19 5' 5.75" (1.67 m)    General appearance: alert, cooperative and appears stated age Head: Normocephalic, without obvious abnormality, atraumatic Neck: no adenopathy, supple, symmetrical, trachea midline and thyroid normal to inspection and palpation Lungs: clear to auscultation bilaterally Cardiovascular: regular rate and rhythm Breasts: normal  appearance, no masses or tenderness Abdomen: soft, non-tender; non distended,  no masses,  no organomegaly Extremities: extremities normal, atraumatic, no cyanosis or edema Skin: Skin color, texture, turgor normal. No rashes or lesions Lymph nodes: Cervical, supraclavicular, and axillary nodes normal. No abnormal inguinal nodes palpated Neurologic: Grossly normal   Pelvic: External genitalia:  no lesions              Urethra:  normal appearing urethra with no masses, tenderness or lesions              Bartholins and Skenes: normal                 Vagina: normal appearing vagina with normal color and discharge, no lesions              Cervix: no lesions and IUD string 3 cm               Bimanual Exam:  Uterus:  exam limited by BMI, uterus is anteverted, not appreciably enlarged, mobile, not tender              Adnexa: no mass, fullness, tenderness               Rectovaginal: Confirms               Anus:  normal sphincter tone, no lesions  Chaperone was present for exam.  A:  Well Woman with normal exam  IUD check  Fibroid uterus, limited exam, not appreciably enlarged on exam today  Multiple medical issues, managed by primary MD  BMI 44  P:   No pap this year  Mammogram in 11/20  Labs with primary  Discussed IFOB  Discussed breast self exam  Discussed calcium and vit D intake

## 2019-08-10 ENCOUNTER — Other Ambulatory Visit: Payer: Self-pay

## 2019-08-11 ENCOUNTER — Encounter: Payer: Self-pay | Admitting: Obstetrics and Gynecology

## 2019-08-11 ENCOUNTER — Ambulatory Visit (INDEPENDENT_AMBULATORY_CARE_PROVIDER_SITE_OTHER): Payer: No Typology Code available for payment source | Admitting: Obstetrics and Gynecology

## 2019-08-11 VITALS — BP 124/82 | HR 76 | Temp 97.1°F | Ht 66.0 in | Wt 274.0 lb

## 2019-08-11 DIAGNOSIS — Z30431 Encounter for routine checking of intrauterine contraceptive device: Secondary | ICD-10-CM | POA: Diagnosis not present

## 2019-08-11 DIAGNOSIS — E785 Hyperlipidemia, unspecified: Secondary | ICD-10-CM | POA: Insufficient documentation

## 2019-08-11 DIAGNOSIS — E038 Other specified hypothyroidism: Secondary | ICD-10-CM

## 2019-08-11 DIAGNOSIS — E119 Type 2 diabetes mellitus without complications: Secondary | ICD-10-CM | POA: Insufficient documentation

## 2019-08-11 DIAGNOSIS — Z01419 Encounter for gynecological examination (general) (routine) without abnormal findings: Secondary | ICD-10-CM

## 2019-08-11 DIAGNOSIS — Z1211 Encounter for screening for malignant neoplasm of colon: Secondary | ICD-10-CM

## 2019-08-11 DIAGNOSIS — Z113 Encounter for screening for infections with a predominantly sexual mode of transmission: Secondary | ICD-10-CM

## 2019-08-11 DIAGNOSIS — D259 Leiomyoma of uterus, unspecified: Secondary | ICD-10-CM | POA: Diagnosis not present

## 2019-08-11 DIAGNOSIS — R7303 Prediabetes: Secondary | ICD-10-CM | POA: Insufficient documentation

## 2019-08-11 DIAGNOSIS — I10 Essential (primary) hypertension: Secondary | ICD-10-CM

## 2019-08-11 NOTE — Patient Instructions (Signed)
EXERCISE AND DIET:  We recommended that you start or continue a regular exercise program for good health. Regular exercise means any activity that makes your heart beat faster and makes you sweat.  We recommend exercising at least 30 minutes per day at least 3 days a week, preferably 4 or 5.  We also recommend a diet low in fat and sugar.  Inactivity, poor dietary choices and obesity can cause diabetes, heart attack, stroke, and kidney damage, among others.    ALCOHOL AND SMOKING:  Women should limit their alcohol intake to no more than 7 drinks/beers/glasses of wine (combined, not each!) per week. Moderation of alcohol intake to this level decreases your risk of breast cancer and liver damage. And of course, no recreational drugs are part of a healthy lifestyle.  And absolutely no smoking or even second hand smoke. Most people know smoking can cause heart and lung diseases, but did you know it also contributes to weakening of your bones? Aging of your skin?  Yellowing of your teeth and nails?  CALCIUM AND VITAMIN D:  Adequate intake of calcium and Vitamin D are recommended.  The recommendations for exact amounts of these supplements seem to change often, but generally speaking 1,000 mg of calcium (between diet and supplement) and 800 units of Vitamin D per day seems prudent. Certain women may benefit from higher intake of Vitamin D.  If you are among these women, your doctor will have told you during your visit.    PAP SMEARS:  Pap smears, to check for cervical cancer or precancers,  have traditionally been done yearly, although recent scientific advances have shown that most women can have pap smears less often.  However, every woman still should have a physical exam from her gynecologist every year. It will include a breast check, inspection of the vulva and vagina to check for abnormal growths or skin changes, a visual exam of the cervix, and then an exam to evaluate the size and shape of the uterus and  ovaries.  And after 47 years of age, a rectal exam is indicated to check for rectal cancers. We will also provide age appropriate advice regarding health maintenance, like when you should have certain vaccines, screening for sexually transmitted diseases, bone density testing, colonoscopy, mammograms, etc.   MAMMOGRAMS:  All women over 40 years old should have a yearly mammogram. Many facilities now offer a "3D" mammogram, which may cost around $50 extra out of pocket. If possible,  we recommend you accept the option to have the 3D mammogram performed.  It both reduces the number of women who will be called back for extra views which then turn out to be normal, and it is better than the routine mammogram at detecting truly abnormal areas.    COLON CANCER SCREENING: Now recommend starting at age 45. At this time colonoscopy is not covered for routine screening until 50. There are take home tests that can be done between 45-49.   COLONOSCOPY:  Colonoscopy to screen for colon cancer is recommended for all women at age 50.  We know, you hate the idea of the prep.  We agree, BUT, having colon cancer and not knowing it is worse!!  Colon cancer so often starts as a polyp that can be seen and removed at colonscopy, which can quite literally save your life!  And if your first colonoscopy is normal and you have no family history of colon cancer, most women don't have to have it again for   10 years.  Once every ten years, you can do something that may end up saving your life, right?  We will be happy to help you get it scheduled when you are ready.  Be sure to check your insurance coverage so you understand how much it will cost.  It may be covered as a preventative service at no cost, but you should check your particular policy.      Breast Self-Awareness Breast self-awareness means being familiar with how your breasts look and feel. It involves checking your breasts regularly and reporting any changes to your  health care provider. Practicing breast self-awareness is important. A change in your breasts can be a sign of a serious medical problem. Being familiar with how your breasts look and feel allows you to find any problems early, when treatment is more likely to be successful. All women should practice breast self-awareness, including women who have had breast implants. How to do a breast self-exam One way to learn what is normal for your breasts and whether your breasts are changing is to do a breast self-exam. To do a breast self-exam: Look for Changes  1. Remove all the clothing above your waist. 2. Stand in front of a mirror in a room with good lighting. 3. Put your hands on your hips. 4. Push your hands firmly downward. 5. Compare your breasts in the mirror. Look for differences between them (asymmetry), such as: ? Differences in shape. ? Differences in size. ? Puckers, dips, and bumps in one breast and not the other. 6. Look at each breast for changes in your skin, such as: ? Redness. ? Scaly areas. 7. Look for changes in your nipples, such as: ? Discharge. ? Bleeding. ? Dimpling. ? Redness. ? A change in position. Feel for Changes Carefully feel your breasts for lumps and changes. It is best to do this while lying on your back on the floor and again while sitting or standing in the shower or tub with soapy water on your skin. Feel each breast in the following way:  Place the arm on the side of the breast you are examining above your head.  Feel your breast with the other hand.  Start in the nipple area and make  inch (2 cm) overlapping circles to feel your breast. Use the pads of your three middle fingers to do this. Apply light pressure, then medium pressure, then firm pressure. The light pressure will allow you to feel the tissue closest to the skin. The medium pressure will allow you to feel the tissue that is a little deeper. The firm pressure will allow you to feel the tissue  close to the ribs.  Continue the overlapping circles, moving downward over the breast until you feel your ribs below your breast.  Move one finger-width toward the center of the body. Continue to use the  inch (2 cm) overlapping circles to feel your breast as you move slowly up toward your collarbone.  Continue the up and down exam using all three pressures until you reach your armpit.  Write Down What You Find  Write down what is normal for each breast and any changes that you find. Keep a written record with breast changes or normal findings for each breast. By writing this information down, you do not need to depend only on memory for size, tenderness, or location. Write down where you are in your menstrual cycle, if you are still menstruating. If you are having trouble noticing differences   in your breasts, do not get discouraged. With time you will become more familiar with the variations in your breasts and more comfortable with the exam. How often should I examine my breasts? Examine your breasts every month. If you are breastfeeding, the best time to examine your breasts is after a feeding or after using a breast pump. If you menstruate, the best time to examine your breasts is 5-7 days after your period is over. During your period, your breasts are lumpier, and it may be more difficult to notice changes. When should I see my health care provider? See your health care provider if you notice:  A change in shape or size of your breasts or nipples.  A change in the skin of your breast or nipples, such as a reddened or scaly area.  Unusual discharge from your nipples.  A lump or thick area that was not there before.  Pain in your breasts.  Anything that concerns you.  

## 2019-08-12 LAB — HIV ANTIBODY (ROUTINE TESTING W REFLEX): HIV Screen 4th Generation wRfx: NONREACTIVE

## 2019-08-12 LAB — RPR: RPR Ser Ql: NONREACTIVE

## 2019-08-15 LAB — GC/CHLAMYDIA PROBE AMP
Chlamydia trachomatis, NAA: NEGATIVE
Neisseria Gonorrhoeae by PCR: NEGATIVE

## 2019-10-04 ENCOUNTER — Other Ambulatory Visit: Payer: Self-pay | Admitting: Family Medicine

## 2019-10-04 DIAGNOSIS — Z1231 Encounter for screening mammogram for malignant neoplasm of breast: Secondary | ICD-10-CM

## 2019-10-25 MED FILL — HYDROCHLOROTHIAZIDE 25 MG T: 25 | 90 days supply | Qty: 90 | Fill #0

## 2019-10-25 MED FILL — LOSARTAN POTASSIUM 100 MG T: 100 | 90 days supply | Qty: 90 | Fill #0

## 2019-10-25 MED FILL — LEVOTHYROXINE 112 MCG TAB: 112 | 30 days supply | Qty: 30 | Fill #1

## 2019-10-28 ENCOUNTER — Other Ambulatory Visit: Payer: Self-pay | Admitting: Endocrinology

## 2019-10-28 DIAGNOSIS — E041 Nontoxic single thyroid nodule: Secondary | ICD-10-CM

## 2019-10-28 MED FILL — METFORMIN HCL ER 500 MG TB2: 500 | 90 days supply | Qty: 90 | Fill #0

## 2019-11-14 ENCOUNTER — Ambulatory Visit
Admission: RE | Admit: 2019-11-14 | Discharge: 2019-11-14 | Disposition: A | Discharge status: Home / Self Care | Payer: No Typology Code available for payment source | Source: Ambulatory Visit | Attending: Endocrinology | Admitting: Endocrinology

## 2019-11-14 DIAGNOSIS — E041 Nontoxic single thyroid nodule: Secondary | ICD-10-CM

## 2019-11-23 ENCOUNTER — Ambulatory Visit
Admission: RE | Admit: 2019-11-23 | Discharge: 2019-11-23 | Disposition: A | Discharge status: Home / Self Care | Payer: No Typology Code available for payment source | Source: Ambulatory Visit | Attending: Family Medicine | Admitting: Family Medicine

## 2019-11-23 ENCOUNTER — Other Ambulatory Visit: Payer: Self-pay

## 2019-11-23 DIAGNOSIS — Z1231 Encounter for screening mammogram for malignant neoplasm of breast: Secondary | ICD-10-CM

## 2019-12-08 MED FILL — LEVOTHYROXINE 112 MCG TAB: 112 | 90 days supply | Qty: 90 | Fill #2

## 2020-01-30 MED FILL — HYDROCHLOROTHIAZIDE 25 MG T: 25 | 90 days supply | Qty: 90 | Fill #1

## 2020-01-30 MED FILL — LOSARTAN POTASSIUM 100 MG T: 100 | 90 days supply | Qty: 90 | Fill #1

## 2020-03-14 DIAGNOSIS — R7301 Impaired fasting glucose: Secondary | ICD-10-CM | POA: Diagnosis not present

## 2020-03-14 DIAGNOSIS — E039 Hypothyroidism, unspecified: Secondary | ICD-10-CM | POA: Diagnosis not present

## 2020-03-14 DIAGNOSIS — I10 Essential (primary) hypertension: Secondary | ICD-10-CM | POA: Diagnosis not present

## 2020-03-21 DIAGNOSIS — E039 Hypothyroidism, unspecified: Secondary | ICD-10-CM | POA: Diagnosis not present

## 2020-03-21 DIAGNOSIS — E041 Nontoxic single thyroid nodule: Secondary | ICD-10-CM | POA: Diagnosis not present

## 2020-03-21 DIAGNOSIS — E7800 Pure hypercholesterolemia, unspecified: Secondary | ICD-10-CM | POA: Diagnosis not present

## 2020-03-21 DIAGNOSIS — I10 Essential (primary) hypertension: Secondary | ICD-10-CM | POA: Diagnosis not present

## 2020-03-21 DIAGNOSIS — R7301 Impaired fasting glucose: Secondary | ICD-10-CM | POA: Diagnosis not present

## 2020-03-21 MED FILL — LEVOTHYROXINE 100 MCG TABLE: 100 | 30 days supply | Qty: 30 | Fill #0

## 2020-05-23 MED FILL — HYDROCHLOROTHIAZIDE 25 MG T: 25 | 90 days supply | Qty: 90 | Fill #2

## 2020-05-23 MED FILL — LOSARTAN POTASSIUM 100 MG T: 100 | 90 days supply | Qty: 90 | Fill #2

## 2020-06-25 ENCOUNTER — Telehealth: Payer: Self-pay | Admitting: Family Medicine

## 2020-06-25 ENCOUNTER — Encounter: Payer: No Typology Code available for payment source | Admitting: Family Medicine

## 2020-06-25 ENCOUNTER — Other Ambulatory Visit: Payer: Self-pay | Admitting: Emergency Medicine

## 2020-06-25 DIAGNOSIS — R7303 Prediabetes: Secondary | ICD-10-CM

## 2020-06-25 DIAGNOSIS — E559 Vitamin D deficiency, unspecified: Secondary | ICD-10-CM

## 2020-06-25 DIAGNOSIS — E7800 Pure hypercholesterolemia, unspecified: Secondary | ICD-10-CM

## 2020-06-25 DIAGNOSIS — I10 Essential (primary) hypertension: Secondary | ICD-10-CM

## 2020-06-25 DIAGNOSIS — Z0000 Encounter for general adult medical examination without abnormal findings: Secondary | ICD-10-CM

## 2020-06-25 NOTE — Telephone Encounter (Signed)
Labs has been ordered.

## 2020-06-25 NOTE — Telephone Encounter (Signed)
Please order fasting labs for physical scheduled for 07/19/2020

## 2020-07-04 ENCOUNTER — Ambulatory Visit: Payer: 59 | Attending: Internal Medicine

## 2020-07-04 DIAGNOSIS — Z20822 Contact with and (suspected) exposure to covid-19: Secondary | ICD-10-CM | POA: Insufficient documentation

## 2020-07-05 LAB — NOVEL CORONAVIRUS, NAA: SARS-CoV-2, NAA: NOT DETECTED

## 2020-07-05 LAB — SARS-COV-2, NAA 2 DAY TAT

## 2020-07-13 ENCOUNTER — Ambulatory Visit (INDEPENDENT_AMBULATORY_CARE_PROVIDER_SITE_OTHER): Payer: 59 | Admitting: Family Medicine

## 2020-07-13 ENCOUNTER — Other Ambulatory Visit: Payer: Self-pay

## 2020-07-13 DIAGNOSIS — M25561 Pain in right knee: Secondary | ICD-10-CM | POA: Diagnosis not present

## 2020-07-13 MED ORDER — DICLOFENAC SODIUM 1 % EX GEL: 2.0000 g | Freq: Four times a day (QID) | CUTANEOUS | 0 refills | Status: DC

## 2020-07-13 MED ORDER — METHYLPREDNISOLONE ACETATE 40 MG/ML IJ SUSP
40.0000 mg | Freq: Once | INTRAMUSCULAR | Status: AC
  Administered 2020-07-13: 40 mg via INTRA_ARTICULAR

## 2020-07-13 MED FILL — DICLOFENAC SODIUM 1 % GEL: 1 | 12 days supply | Qty: 100 | Fill #0

## 2020-07-13 NOTE — Patient Instructions (Signed)
It was great to meet you today! Thank you for letting me participate in your care!  Today, we discussed your right knee pain which is most likely due to an element of arthritis. I gave you an injection today and I want you to continue with conservative management with Voltaren gel, compression sleeve, and weight loss. If you do not improve in 2 weeks you need to return to the clinic so we can do some imaging.  Be well, Harolyn Rutherford, DO PGY-4, Sports Medicine Fellow [place]

## 2020-07-13 NOTE — Assessment & Plan Note (Signed)
The etiology of her knee pain to me seems multifactorial.  Given that she has a known history of osteoarthritis in both knees that has been typically worse on the left more than the right historically this is most likely an acute flare of chronic OA of the right knee.  This makes sense given her history and exam of pain with extension feeling of grinding and the pain described as an ache dull throb that is worse with activity.  Her pain pattern indicates more patellofemoral OA but she has never had any imaging to confirm this.  Pain in the back of her knee which self resolved sounds most likely to come from a Baker's cyst that ruptured on its own.  Given that there was no obvious effusion in the back of her knee and it was not tender today there was nothing to do for this issue. -Steroid injection of the right knee as notated below - The patient for the next 4 to 5 days to get the acute flare down she should use Voltaren gel scheduled 4 times a day with over-the-counter Tylenol - Encourage patient to use compression sleeve - Encouraged weight loss - Follow-up in 2 weeks to ensure shot has given her some improvement and that we do not need to do any follow-up imaging

## 2020-07-13 NOTE — Progress Notes (Signed)
SUBJECTIVE:   CHIEF COMPLAINT / HPI:   Right Knee Pain [name] is a patient well-known to this practice who presents today due to 2 weeks of right knee pain.  Denies any acute trauma or falls or injuries to the area and states that she was in the shower about 2 weeks ago when she felt and heard a crack and immediate sharp pain in her knee.  She states that the initial pain was felt more in the back of her knee however she did not feel unstable at the time and her knee did not lock up but just continued to hurt and she did feel that she has some swelling.  She thought the pain would go away however it has not done so and she continues to experience some tightness and pain specifically when extending her knee and states that it does crack.  The pain in the back of her knee has self resolved and gotten much better.  She does not endorse any weakness or feeling like her knee is going to "give out".  She denies any locking of the right knee or the any sensation where she feels like something is inside of the joint.  She has a history of arthritis in both knees complicated by obesity.  PERTINENT  PMH / PSH: History of bilateral hand osteoarthritis, hypertension, OSA prediabetes  OBJECTIVE:   BP (!) 155/84   Ht 5\' 7"  (1.702 m)   Wt 290 lb (131.5 kg)   BMI 45.42 kg/m   MSK: Knee, Right: Inspection was negative for erythema, ecchymosis, but did have slight effusion. No obvious bony abnormalities or signs of osteophyte development. Palpation yielded no asymmetric warmth; Positive for medial and lateral joint line tenderness; No condyle tenderness; No patellar tenderness; Positive for patellar crepitus. Patellar and quadriceps tendons unremarkable, and no tenderness of the pes anserine bursa. No obvious Baker's cyst development. ROM normal in flexion (135 degrees) and extension (0 degrees). Normal hamstring and quadriceps strength. Neurovascularly intact bilaterally. Special Tests  -  Cruciate/Collateral Ligaments:   - Anterior Drawer:  NEG - Posterior Drawer: NEG   - Varus/Valgus Stress test: NEG  - Meniscus:   - Thessaly: NEG   - McMurray's: NEG  - Patella:   - Patellar grind/compression: NEG    ASSESSMENT/PLAN:   Right knee pain The etiology of her knee pain to me seems multifactorial.  Given that she has a known history of osteoarthritis in both knees that has been typically worse on the left more than the right historically this is most likely an acute flare of chronic OA of the right knee.  This makes sense given her history and exam of pain with extension feeling of grinding and the pain described as an ache dull throb that is worse with activity.  Her pain pattern indicates more patellofemoral OA but she has never had any imaging to confirm this.  Pain in the back of her knee which self resolved sounds most likely to come from a Baker's cyst that ruptured on its own.  Given that there was no obvious effusion in the back of her knee and it was not tender today there was nothing to do for this issue. -Steroid injection of the right knee as notated below - The patient for the next 4 to 5 days to get the acute flare down she should use Voltaren gel scheduled 4 times a day with over-the-counter Tylenol - Encourage patient to use compression sleeve - Encouraged  weight loss - Follow-up in 2 weeks to ensure shot has given her some improvement and that we do not need to do any follow-up imaging  Procedure Knee Injection: Written and verbal consent was obtained after discussing the risks and benefits of the procedure with the patient. The anterior knee was cleansed in a sterile fashion with betadine. 1cc of 40 mg Depo-medrol and 4 cc 1% Lidocaine was injected using an anteromedial approach using a 5 cc syringe and 22 gauge 11/2 in needle. No complications were encountered. Minimal blood loss. A band aid was applied.   Procedure was performed by Dr. Dorcas Mcmurray, MD  Nuala Alpha, DO PGY-4, Sports Medicine Fellow [place]

## 2020-07-16 NOTE — Progress Notes (Signed)
[facility]: Attending Note: I have reviewed the chart, discussed wit the Sports Medicine Fellow. I agree with assessment and treatment plan as detailed in the [place] note. CSI.

## 2020-07-17 ENCOUNTER — Ambulatory Visit (INDEPENDENT_AMBULATORY_CARE_PROVIDER_SITE_OTHER): Payer: 59 | Admitting: Family Medicine

## 2020-07-17 ENCOUNTER — Other Ambulatory Visit: Payer: Self-pay

## 2020-07-17 DIAGNOSIS — E559 Vitamin D deficiency, unspecified: Secondary | ICD-10-CM

## 2020-07-17 DIAGNOSIS — R7303 Prediabetes: Secondary | ICD-10-CM

## 2020-07-17 DIAGNOSIS — E7800 Pure hypercholesterolemia, unspecified: Secondary | ICD-10-CM | POA: Diagnosis not present

## 2020-07-18 LAB — CMP14+EGFR
ALT: 19 IU/L (ref 0–32)
AST: 17 IU/L (ref 0–40)
Albumin/Globulin Ratio: 1.3 (ref 1.2–2.2)
Albumin: 4.3 g/dL (ref 3.8–4.8)
Alkaline Phosphatase: 101 IU/L (ref 48–121)
BUN/Creatinine Ratio: 26 — ABNORMAL HIGH (ref 9–23)
BUN: 20 mg/dL (ref 6–24)
Bilirubin Total: 0.3 mg/dL (ref 0.0–1.2)
CO2: 24 mmol/L (ref 20–29)
Calcium: 9.3 mg/dL (ref 8.7–10.2)
Chloride: 100 mmol/L (ref 96–106)
Creatinine, Ser: 0.76 mg/dL (ref 0.57–1.00)
GFR calc Af Amer: 107 mL/min/{1.73_m2} (ref >59)
GFR calc non Af Amer: 93 mL/min/{1.73_m2} (ref >59)
Globulin, Total: 3.3 g/dL (ref 1.5–4.5)
Glucose: 99 mg/dL (ref 65–99)
Potassium: 3.7 mmol/L (ref 3.5–5.2)
Sodium: 138 mmol/L (ref 134–144)
Total Protein: 7.6 g/dL (ref 6.0–8.5)

## 2020-07-18 LAB — LIPID PANEL
Chol/HDL Ratio: 5.3 ratio — ABNORMAL HIGH (ref 0.0–4.4)
Cholesterol, Total: 248 mg/dL — ABNORMAL HIGH (ref 100–199)
HDL: 47 mg/dL (ref >39)
LDL Chol Calc (NIH): 186 mg/dL — ABNORMAL HIGH (ref 0–99)
Triglycerides: 86 mg/dL (ref 0–149)
VLDL Cholesterol Cal: 15 mg/dL (ref 5–40)

## 2020-07-18 LAB — VITAMIN D 25 HYDROXY (VIT D DEFICIENCY, FRACTURES): Vit D, 25-Hydroxy: 18.5 ng/mL — ABNORMAL LOW (ref 30.0–100.0)

## 2020-07-18 LAB — HEMOGLOBIN A1C
Est. average glucose Bld gHb Est-mCnc: 137 mg/dL
Hgb A1c MFr Bld: 6.4 % — ABNORMAL HIGH (ref 4.8–5.6)

## 2020-07-19 ENCOUNTER — Encounter: Payer: Self-pay | Admitting: Family Medicine

## 2020-07-20 ENCOUNTER — Other Ambulatory Visit: Payer: Self-pay | Admitting: Family Medicine

## 2020-07-20 ENCOUNTER — Encounter: Payer: Self-pay | Admitting: Family Medicine

## 2020-07-20 ENCOUNTER — Other Ambulatory Visit: Payer: Self-pay

## 2020-07-20 ENCOUNTER — Ambulatory Visit (INDEPENDENT_AMBULATORY_CARE_PROVIDER_SITE_OTHER): Payer: 59 | Admitting: Family Medicine

## 2020-07-20 VITALS — BP 144/83 | HR 67 | Temp 97.7°F | Resp 18 | Ht 67.0 in | Wt 290.2 lb

## 2020-07-20 DIAGNOSIS — G8929 Other chronic pain: Secondary | ICD-10-CM | POA: Diagnosis not present

## 2020-07-20 DIAGNOSIS — I10 Essential (primary) hypertension: Secondary | ICD-10-CM | POA: Diagnosis not present

## 2020-07-20 DIAGNOSIS — M25562 Pain in left knee: Secondary | ICD-10-CM

## 2020-07-20 DIAGNOSIS — Z0001 Encounter for general adult medical examination with abnormal findings: Secondary | ICD-10-CM

## 2020-07-20 DIAGNOSIS — R7303 Prediabetes: Secondary | ICD-10-CM | POA: Diagnosis not present

## 2020-07-20 DIAGNOSIS — R635 Abnormal weight gain: Secondary | ICD-10-CM | POA: Diagnosis not present

## 2020-07-20 DIAGNOSIS — G40909 Epilepsy, unspecified, not intractable, without status epilepticus: Secondary | ICD-10-CM | POA: Insufficient documentation

## 2020-07-20 DIAGNOSIS — M25561 Pain in right knee: Secondary | ICD-10-CM | POA: Diagnosis not present

## 2020-07-20 DIAGNOSIS — E559 Vitamin D deficiency, unspecified: Secondary | ICD-10-CM | POA: Diagnosis not present

## 2020-07-20 DIAGNOSIS — Z0000 Encounter for general adult medical examination without abnormal findings: Secondary | ICD-10-CM

## 2020-07-20 MED ORDER — HYDROCHLOROTHIAZIDE 25 MG PO TABS: 25.0000 mg | ORAL_TABLET | Freq: Every day | ORAL | 3 refills | Status: DC

## 2020-07-20 MED ORDER — METFORMIN HCL 500 MG PO TABS: 500.0000 mg | ORAL_TABLET | Freq: Two times a day (BID) | ORAL | 3 refills | Status: DC

## 2020-07-20 MED ORDER — NAPROXEN 500 MG PO TABS: ORAL_TABLET | ORAL | 3 refills | Status: DC

## 2020-07-20 MED ORDER — VITAMIN D (ERGOCALCIFEROL) 1.25 MG (50000 UNIT) PO CAPS: 50000.0000 [IU] | ORAL_CAPSULE | ORAL | 0 refills | Status: DC

## 2020-07-20 MED ORDER — LOSARTAN POTASSIUM 100 MG PO TABS: 100.0000 mg | ORAL_TABLET | Freq: Every day | ORAL | 3 refills | Status: DC

## 2020-07-20 MED FILL — VIT D2 1.25 MG (50,000 UNIT: 1.25 MG | 84 days supply | Qty: 12 | Fill #0

## 2020-07-20 MED FILL — NAPROXEN 500 MG TABLET: 500 | 30 days supply | Qty: 60 | Fill #0

## 2020-07-20 MED FILL — METFORMIN HCL 500 MG TABS: 500 | 90 days supply | Qty: 180 | Fill #0

## 2020-07-20 NOTE — Progress Notes (Signed)
Patient ID: Pam Mckee, female    DOB: [birth year]  Age: 49 y.o. MRN: [id number]  Chief Complaint  Patient presents with  . Annual Exam    patient states she is here for her Physical.    Subjective:  Annual physical examination:  Patient is here for annual physical examination.  She has no major acute medical complaints.  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  She has another job with: Now and is getting ready to switch to something else in their system again.  She does not get any regular exercise.  She has a significant other.  She has gone through dietary counseling.  She knows that her weight is an issue.  Family history: Mother is deceased of a CVA at age 57, father died at 67 when he was murdered.  Her brother had a brain aneurysm and shortly thereafter a heart aneurysm and was diagnosed with Kawasaki's disease, unusual at his age.  Review of systems: Constitutional: Unremarkable HEENT: Unremarkable Cardiovascular: Unremarkable Respiratory: Shortness of breath on exertion because she is out of shape Gastrointestinal: Some fullness and discomfort across the upper abdomen at times.  Bowels and kidneys act okay. GU: Unremarkable Musculoskeletal: Knee pains Dermatologic: Unremarkable Neurologic: Unremarkable Psychiatric: Unremarkable Endocrine: Unremarkable    Current allergies, medications, problem list, past/family and social histories reviewed.  Objective:  BP (!) 144/83   Pulse 67   Temp 97.7 F (36.5 C) (Temporal)   Resp 18   Ht 5\' 7"  (1.702 m)   Wt 290 lb 3.2 oz (131.6 kg)   SpO2 100%   BMI 45.45 kg/m  Pleasant lady, alert and oriented.  Overweight.  We discussed that.  TMs normal.  Eyes PERRL.  Throat clear.  Neck supple without nodes or thyromegaly.  No carotid bruits.  Chest is clear to auscultation.  Heart  rate without murmurs, gallops, or arrhythmias.  Abdomen without mass or tenderness.  Pelvic and breast exam not done, she sees a gynecologist.  Extremities unremarkable except for pain in her knees.  Assessment & Plan:   Assessment: 1. Annual physical exam   2. Prediabetes   3. Weight gain   4. Essential hypertension   5. Vitamin D deficiency   6. Chronic pain of both knees   7. Severe obesity (BMI >= 40) (HCC) Chronic      Plan: See instructions.  Discussed that the common denominator for problems is her weight and she needs to work hard on that.  We'll treat the prediabetes. No orders of the defined types were placed in this encounter.   Meds ordered this encounter  Medications  . naproxen (NAPROSYN) 500 MG tablet    Sig: TAKE 1 TABLET BY MOUTH EVERY 12 HOURS AS NEEDED FOR PAIN    Dispense:  60 tablet    Refill:  3  . hydrochlorothiazide (HYDRODIURIL) 25 MG tablet    Sig: Take 1 tablet (25 mg total) by mouth daily.    Dispense:  90 tablet    Refill:  3  . losartan (COZAAR) 100 MG tablet    Sig: Take 1 tablet (100 mg total) by mouth daily.    Dispense:  90 tablet    Refill:  3  . metFORMIN (GLUCOPHAGE) 500 MG tablet    Sig: Take 1 tablet (500 mg total) by mouth 2 (two) times daily with a  meal.    Dispense:  180 tablet    Refill:  3  . Vitamin D, Ergocalciferol, (DRISDOL) 1.25 MG (50000 UNIT) CAPS capsule    Sig: Take 1 capsule (50,000 Units total) by mouth every 7 (seven) days.    Dispense:  12 capsule    Refill:  0         Patient Instructions    Take vitamin D 50,000 units weekly for 3 months, then take OTC vitamin D daily  Begin on Metformin 500 mg 1 twice daily for prediabetes.  Plan to return in about 6 months to get repeat A1c testing done and assess her status at that time.  Continue your current blood pressure medications and naproxen as needed  Work hard on trying to change her dietary habits and exercise to reduce your weight  I recommend the  diabetes.org website   If you have lab work done today you will be contacted with your lab results within the next 2 weeks.  If you have not heard from [place] then please contact us. The fastest way to get your results is to register for My Chart.   IF you received an x-ray today, you will receive an invoice from [facility]. Please contact [facility]iology at [phone] with questions or concerns regarding your invoice.   IF you received labwork today, you will receive an invoice from [place]. Please contact [facility] at [phone] with questions or concerns regarding your invoice.   Our billing staff will not be able to assist you with questions regarding bills from these companies.  You will be contacted with the lab results as soon as they are available. The fastest way to get your results is to activate your My Chart account. Instructions are located on the last page of this paperwork. If you have not heard from [place] regarding the results in 2 weeks, please contact this office.        Return in about 6 months (around 01/20/2021), or prediabetes follow-u[, for [place].   Ruben Reason, MD 07/20/2020

## 2020-07-20 NOTE — Patient Instructions (Addendum)
  Take vitamin D 50,000 units weekly for 3 months, then take OTC vitamin D daily  Begin on Metformin 500 mg 1 twice daily for prediabetes.  Plan to return in about 6 months to get repeat A1c testing done and assess her status at that time.  Continue your current blood pressure medications and naproxen as needed  Work hard on trying to change her dietary habits and exercise to reduce your weight  I recommend the diabetes.org website   If you have lab work done today you will be contacted with your lab results within the next 2 weeks.  If you have not heard from [place] then please contact us. The fastest way to get your results is to register for My Chart.   IF you received an x-ray today, you will receive an invoice from [facility] Radiology. Please contact [name]iology at [phone] with questions or concerns regarding your invoice.   IF you received labwork today, you will receive an invoice from [facility]. Please contact [facility] at [phone] with questions or concerns regarding your invoice.   Our billing staff will not be able to assist you with questions regarding bills from these companies.  You will be contacted with the lab results as soon as they are available. The fastest way to get your results is to activate your My Chart account. Instructions are located on the last page of this paperwork. If you have not heard from [place] regarding the results in 2 weeks, please contact this office.

## 2020-07-20 NOTE — Progress Notes (Signed)
Patient ID: JENIKA CHIEM, female    DOB: 1971-02-13  Age: 49 y.o. MRN: [id number]  Chief Complaint  Patient presents with  . Annual Exam    patient states she is here for her Physical.    Subjective:   Annual physical exam:    Current allergies, medications, problem list, past/family and social histories reviewed.  Objective:  BP (!) 144/83   Pulse 67   Temp 97.7 F (36.5 C) (Temporal)   Resp 18   Ht 5\' 7"  (1.702 m)   Wt 290 lb 3.2 oz (131.6 kg)   SpO2 100%   BMI 45.45 kg/m     Assessment & Plan:   Assessment: No diagnosis found.    Plan:   No orders of the defined types were placed in this encounter.   No orders of the defined types were placed in this encounter.        Patient Instructions       If you have lab work done today you will be contacted with your lab results within the next 2 weeks.  If you have not heard from [place] then please contact us. The fastest way to get your results is to register for My Chart.   IF you received an x-ray today, you will receive an invoice from [facility]. Please contact [facility] Radiology at [phone] with questions or concerns regarding your invoice.   IF you received labwork today, you will receive an invoice from [place]. Please contact [facility] at [phone] with questions or concerns regarding your invoice.   Our billing staff will not be able to assist you with questions regarding bills from these companies.  You will be contacted with the lab results as soon as they are available. The fastest way to get your results is to activate your My Chart account. Instructions are located on the last page of this paperwork. If you have not heard from [place] regarding the results in 2 weeks, please contact this office.        No follow-ups on file.   Ruben Reason, MD 07/20/2020

## 2020-08-07 DIAGNOSIS — H5213 Myopia, bilateral: Secondary | ICD-10-CM | POA: Diagnosis not present

## 2020-08-07 NOTE — Progress Notes (Signed)
49 y.o. G52P1101 Single Black or African American Not Hispanic or Latino female here for annual exam.  Patient is having trouble with Chafing under her belly and groin, it is itchy. She uses A&D ointment and powder.  She has a mirena IUD, placed in 7/17. No cycles. Sexually active, same partner. No dyspareunia.    No LMP recorded. (Menstrual status: IUD).          Sexually active: Yes.    The current method of family planning is IUD.    Exercising: No.  The patient does not participate in regular exercise at present. Smoker:  no  Health Maintenance: Pap:  08/04/2018 WNL NEG HPV,05/18/2015 WNL  History of abnormal Pap:  no MMG:  11/24/19 Density B Bi-rads 1 neg  BMD:   Never Colonoscopy: none  TDaP:  2013 Gardasil: NA   reports that she has never smoked. She has never used smokeless tobacco. She reports that she does not drink alcohol and does not use drugs. 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 Son is 14 lives in [place], going to school, wants to be a Marine scientist.  Past Medical History:  Diagnosis Date  . Anemia   . Elevated lipids   . Hypertension   . Olecranon bursitis of right elbow 02/15/2015   Suspect this was an acute gout flare triggered by HCTZ since she had no trauma   . OSA on CPAP   . Prediabetes   . Primary osteoarthritis of left knee 05/18/2015   Nonweightbearing x-rays revealed mild degenerative changes.  Referred to [facility]  - saw [place] , [name]   . Seizures ([place])   . Thyroid disease    hypo  . Torn LEFT medial meniscus 04/2015   Left knee MRI confirmed but no instability sxs. Seen at [facility] 04/2016 to review treatment plan. sxs responded to cortisone injection summer 2016 and 04/2016 so continue prn. Consider arthroscopy if sxs become unresponsive.    Past Surgical History:  Procedure Laterality Date  . INTRAUTERINE DEVICE (IUD) INSERTION  06/2016    Current Outpatient Medications  Medication Sig Dispense Refill  .  diclofenac Sodium (VOLTAREN) 1 % GEL Apply 2 g topically 4 (four) times daily. 100 g 0  . EUTHYROX 100 MCG tablet Take 100 mcg by mouth every morning.    . hydrochlorothiazide (HYDRODIURIL) 25 MG tablet Take 1 tablet (25 mg total) by mouth daily. 90 tablet 3  . levonorgestrel (MIRENA) 20 MCG/24HR IUD 1 each by Intrauterine route once.    Marland Kitchen losartan (COZAAR) 100 MG tablet Take 1 tablet (100 mg total) by mouth daily. 90 tablet 3  . metFORMIN (GLUCOPHAGE) 500 MG tablet Take 1 tablet (500 mg total) by mouth 2 (two) times daily with a meal. 180 tablet 3  . naproxen (NAPROSYN) 500 MG tablet TAKE 1 TABLET BY MOUTH EVERY 12 HOURS AS NEEDED FOR PAIN 60 tablet 3  . Vitamin D, Ergocalciferol, (DRISDOL) 1.25 MG (50000 UNIT) CAPS capsule Take 1 capsule (50,000 Units total) by mouth every 7 (seven) days. 12 capsule 0   No current facility-administered medications for this visit.    Family History  Problem Relation Age of Onset  . Hypertension Mother   . CVA Mother        occured while on lovenox  . Hypertension Brother   . Aneurysm Brother   . Diabetes Brother   . Diabetes Paternal Grandfather   . Renal Disease Father   .  Breast cancer Neg Hx     Review of Systems  All other systems reviewed and are negative.   Exam:   BP 122/74   Pulse 94   Ht 5\' 6"  (1.676 m)   Wt 295 lb (133.8 kg)   SpO2 98%   BMI 47.61 kg/m   Weight change: @WEIGHTCHANGE @ Height:   Height: 5\' 6"  (167.6 cm)  Ht Readings from Last 3 Encounters:  08/15/20 5\' 6"  (1.676 m)  07/20/20 5\' 7"  (1.702 m)  07/13/20 5\' 7"  (1.702 m)    General appearance: alert, cooperative and appears stated age Head: Normocephalic, without obvious abnormality, atraumatic Neck: no adenopathy, supple, symmetrical, trachea midline and thyroid normal to inspection and palpation Lungs: clear to auscultation bilaterally Cardiovascular: regular rate and rhythm Breasts: normal appearance, no masses or tenderness Abdomen: soft, non-tender; non  distended,  no masses,  no organomegaly Extremities: extremities normal, atraumatic, no cyanosis or edema Skin: Skin color, texture, turgor normal. Increased pigmentation under her panus and in her groin, suspect candida intertrigo.  Lymph nodes: Cervical, supraclavicular, and axillary nodes normal. No abnormal inguinal nodes palpated Neurologic: Grossly normal   Pelvic: External genitalia:  no lesions              Urethra:  normal appearing urethra with no masses, tenderness or lesions              Bartholins and Skenes: normal                 Vagina: normal appearing vagina with normal color and discharge, no lesions              Cervix: no lesions and IUD string 3-4 cm               Bimanual Exam:  Uterus:  no masses or tenderness, exam limited by BMI              Adnexa: no mass, fullness, tenderness               Rectovaginal: Confirms               Anus:  normal sphincter tone, no lesions  Shanon Petty chaperoned for the exam.  A:  Well Woman with normal exam  IUD check  H/O fibroid uterus  Multiple medical issues, managed by primary MD  BMI 47  Candida intertrigo  P:   No pap this year  Mammogram in 12/21  Labs with primary  Information given on the medical weight loss clinic  Discussed breast self exam  Discussed calcium and vit D intake  Nystatin called in

## 2020-08-15 ENCOUNTER — Other Ambulatory Visit: Payer: Self-pay

## 2020-08-15 ENCOUNTER — Ambulatory Visit: Payer: 59 | Admitting: Obstetrics and Gynecology

## 2020-08-15 ENCOUNTER — Encounter: Payer: Self-pay | Admitting: Obstetrics and Gynecology

## 2020-08-15 VITALS — BP 122/74 | HR 94 | Ht 66.0 in | Wt 295.0 lb

## 2020-08-15 DIAGNOSIS — Z30431 Encounter for routine checking of intrauterine contraceptive device: Secondary | ICD-10-CM

## 2020-08-15 DIAGNOSIS — Z01419 Encounter for gynecological examination (general) (routine) without abnormal findings: Secondary | ICD-10-CM

## 2020-08-15 DIAGNOSIS — D259 Leiomyoma of uterus, unspecified: Secondary | ICD-10-CM | POA: Diagnosis not present

## 2020-08-15 DIAGNOSIS — B372 Candidiasis of skin and nail: Secondary | ICD-10-CM | POA: Diagnosis not present

## 2020-08-15 MED ORDER — NYSTATIN 100000 UNIT/GM EX CREA
1.0000 | TOPICAL_CREAM | Freq: Two times a day (BID) | CUTANEOUS | 0 refills | Status: DC
Start: 2020-08-15 — End: 2020-12-03

## 2020-08-15 MED FILL — NYSTATIN 100,000 UNIT/GM CR: 100000 | 7 days supply | Qty: 30 | Fill #0

## 2020-08-15 NOTE — Patient Instructions (Signed)
EXERCISE AND DIET:  We recommended that you start or continue a regular exercise program for good health. Regular exercise means any activity that makes your heart beat faster and makes you sweat.  We recommend exercising at least 30 minutes per day at least 3 days a week, preferably 4 or 5.  We also recommend a diet low in fat and sugar.  Inactivity, poor dietary choices and obesity can cause diabetes, heart attack, stroke, and kidney damage, among others.    ALCOHOL AND SMOKING:  Women should limit their alcohol intake to no more than 7 drinks/beers/glasses of wine (combined, not each!) per week. Moderation of alcohol intake to this level decreases your risk of breast cancer and liver damage. And of course, no recreational drugs are part of a healthy lifestyle.  And absolutely no smoking or even second hand smoke. Most people know smoking can cause heart and lung diseases, but did you know it also contributes to weakening of your bones? Aging of your skin?  Yellowing of your teeth and nails?  CALCIUM AND VITAMIN D:  Adequate intake of calcium and Vitamin D are recommended.  The recommendations for exact amounts of these supplements seem to change often, but generally speaking 1,000 mg of calcium (between diet and supplement) and 800 units of Vitamin D per day seems prudent. Certain women may benefit from higher intake of Vitamin D.  If you are among these women, your doctor will have told you during your visit.    PAP SMEARS:  Pap smears, to check for cervical cancer or precancers,  have traditionally been done yearly, although recent scientific advances have shown that most women can have pap smears less often.  However, every woman still should have a physical exam from her gynecologist every year. It will include a breast check, inspection of the vulva and vagina to check for abnormal growths or skin changes, a visual exam of the cervix, and then an exam to evaluate the size and shape of the uterus and  ovaries.  And after 48 years of age, a rectal exam is indicated to check for rectal cancers. We will also provide age appropriate advice regarding health maintenance, like when you should have certain vaccines, screening for sexually transmitted diseases, bone density testing, colonoscopy, mammograms, etc.   MAMMOGRAMS:  All women over 40 years old should have a yearly mammogram. Many facilities now offer a "3D" mammogram, which may cost around $50 extra out of pocket. If possible,  we recommend you accept the option to have the 3D mammogram performed.  It both reduces the number of women who will be called back for extra views which then turn out to be normal, and it is better than the routine mammogram at detecting truly abnormal areas.    COLON CANCER SCREENING: Now recommend starting at age 45. At this time colonoscopy is not covered for routine screening until 50. There are take home tests that can be done between 45-49.   COLONOSCOPY:  Colonoscopy to screen for colon cancer is recommended for all women at age 50.  We know, you hate the idea of the prep.  We agree, BUT, having colon cancer and not knowing it is worse!!  Colon cancer so often starts as a polyp that can be seen and removed at colonscopy, which can quite literally save your life!  And if your first colonoscopy is normal and you have no family history of colon cancer, most women don't have to have it again for   10 years.  Once every ten years, you can do something that may end up saving your life, right?  We will be happy to help you get it scheduled when you are ready.  Be sure to check your insurance coverage so you understand how much it will cost.  It may be covered as a preventative service at no cost, but you should check your particular policy.      Breast Self-Awareness Breast self-awareness means being familiar with how your breasts look and feel. It involves checking your breasts regularly and reporting any changes to your  health care provider. Practicing breast self-awareness is important. A change in your breasts can be a sign of a serious medical problem. Being familiar with how your breasts look and feel allows you to find any problems early, when treatment is more likely to be successful. All women should practice breast self-awareness, including women who have had breast implants. How to do a breast self-exam One way to learn what is normal for your breasts and whether your breasts are changing is to do a breast self-exam. To do a breast self-exam: Look for Changes  1. Remove all the clothing above your waist. 2. Stand in front of a mirror in a room with good lighting. 3. Put your hands on your hips. 4. Push your hands firmly downward. 5. Compare your breasts in the mirror. Look for differences between them (asymmetry), such as: ? Differences in shape. ? Differences in size. ? Puckers, dips, and bumps in one breast and not the other. 6. Look at each breast for changes in your skin, such as: ? Redness. ? Scaly areas. 7. Look for changes in your nipples, such as: ? Discharge. ? Bleeding. ? Dimpling. ? Redness. ? A change in position. Feel for Changes Carefully feel your breasts for lumps and changes. It is best to do this while lying on your back on the floor and again while sitting or standing in the shower or tub with soapy water on your skin. Feel each breast in the following way:  Place the arm on the side of the breast you are examining above your head.  Feel your breast with the other hand.  Start in the nipple area and make  inch (2 cm) overlapping circles to feel your breast. Use the pads of your three middle fingers to do this. Apply light pressure, then medium pressure, then firm pressure. The light pressure will allow you to feel the tissue closest to the skin. The medium pressure will allow you to feel the tissue that is a little deeper. The firm pressure will allow you to feel the tissue  close to the ribs.  Continue the overlapping circles, moving downward over the breast until you feel your ribs below your breast.  Move one finger-width toward the center of the body. Continue to use the  inch (2 cm) overlapping circles to feel your breast as you move slowly up toward your collarbone.  Continue the up and down exam using all three pressures until you reach your armpit.  Write Down What You Find  Write down what is normal for each breast and any changes that you find. Keep a written record with breast changes or normal findings for each breast. By writing this information down, you do not need to depend only on memory for size, tenderness, or location. Write down where you are in your menstrual cycle, if you are still menstruating. If you are having trouble noticing differences   in your breasts, do not get discouraged. With time you will become more familiar with the variations in your breasts and more comfortable with the exam. How often should I examine my breasts? Examine your breasts every month. If you are breastfeeding, the best time to examine your breasts is after a feeding or after using a breast pump. If you menstruate, the best time to examine your breasts is 5-7 days after your period is over. During your period, your breasts are lumpier, and it may be more difficult to notice changes. When should I see my health care provider? See your health care provider if you notice:  A change in shape or size of your breasts or nipples.  A change in the skin of your breast or nipples, such as a reddened or scaly area.  Unusual discharge from your nipples.  A lump or thick area that was not there before.  Pain in your breasts.  Anything that concerns you.  

## 2020-09-13 DIAGNOSIS — E7800 Pure hypercholesterolemia, unspecified: Secondary | ICD-10-CM | POA: Diagnosis not present

## 2020-09-13 DIAGNOSIS — E039 Hypothyroidism, unspecified: Secondary | ICD-10-CM | POA: Diagnosis not present

## 2020-09-13 DIAGNOSIS — R7301 Impaired fasting glucose: Secondary | ICD-10-CM | POA: Diagnosis not present

## 2020-09-13 DIAGNOSIS — I10 Essential (primary) hypertension: Secondary | ICD-10-CM | POA: Diagnosis not present

## 2020-09-20 ENCOUNTER — Other Ambulatory Visit (HOSPITAL_COMMUNITY): Payer: Self-pay | Admitting: Endocrinology

## 2020-09-20 DIAGNOSIS — E041 Nontoxic single thyroid nodule: Secondary | ICD-10-CM | POA: Diagnosis not present

## 2020-09-20 DIAGNOSIS — I10 Essential (primary) hypertension: Secondary | ICD-10-CM | POA: Diagnosis not present

## 2020-09-20 DIAGNOSIS — R7301 Impaired fasting glucose: Secondary | ICD-10-CM | POA: Diagnosis not present

## 2020-09-20 DIAGNOSIS — E7800 Pure hypercholesterolemia, unspecified: Secondary | ICD-10-CM | POA: Diagnosis not present

## 2020-09-20 DIAGNOSIS — E039 Hypothyroidism, unspecified: Secondary | ICD-10-CM | POA: Diagnosis not present

## 2020-09-20 MED FILL — ROSUVASTATIN CALCIUM 10 MG: 10 | 90 days supply | Qty: 90 | Fill #0

## 2020-09-24 MED FILL — LOSARTAN POTASSIUM 100 MG T: 100 | 90 days supply | Qty: 90 | Fill #0

## 2020-09-24 MED FILL — HYDROCHLOROTHIAZIDE 25 MG T: 25 | 90 days supply | Qty: 90 | Fill #0

## 2020-10-09 ENCOUNTER — Other Ambulatory Visit: Payer: Self-pay | Admitting: Family Medicine

## 2020-10-09 DIAGNOSIS — Z1231 Encounter for screening mammogram for malignant neoplasm of breast: Secondary | ICD-10-CM

## 2020-11-16 ENCOUNTER — Encounter: Payer: Self-pay | Admitting: Obstetrics and Gynecology

## 2020-11-20 ENCOUNTER — Telehealth: Payer: Self-pay

## 2020-11-20 DIAGNOSIS — R7301 Impaired fasting glucose: Secondary | ICD-10-CM | POA: Diagnosis not present

## 2020-11-20 DIAGNOSIS — E7800 Pure hypercholesterolemia, unspecified: Secondary | ICD-10-CM | POA: Diagnosis not present

## 2020-11-20 DIAGNOSIS — E039 Hypothyroidism, unspecified: Secondary | ICD-10-CM | POA: Diagnosis not present

## 2020-11-20 DIAGNOSIS — I10 Essential (primary) hypertension: Secondary | ICD-10-CM | POA: Diagnosis not present

## 2020-11-20 NOTE — Telephone Encounter (Signed)
[facility] Hello Dr Talbert Nan,  I wanted to know can you give me something for under my stomach fold for itching, I used the Nystatin you gave me but it is not working. I'been using Vagsil Powder as well but it still seems to itch.    Thank You ,  Pam Mckee

## 2020-11-20 NOTE — Telephone Encounter (Signed)
Message left to return call to Emily at [phone].    

## 2020-11-26 ENCOUNTER — Other Ambulatory Visit: Payer: Self-pay

## 2020-11-26 ENCOUNTER — Ambulatory Visit
Admission: RE | Admit: 2020-11-26 | Discharge: 2020-11-26 | Disposition: A | Discharge status: Home / Self Care | Payer: 59 | Source: Ambulatory Visit | Attending: Family Medicine | Admitting: Family Medicine

## 2020-11-26 DIAGNOSIS — Z1231 Encounter for screening mammogram for malignant neoplasm of breast: Secondary | ICD-10-CM

## 2020-11-26 NOTE — Telephone Encounter (Signed)
Left message for pt to return call to triage RN. 

## 2020-11-28 NOTE — Telephone Encounter (Signed)
Have attempted to call pt and no answer or returned call.  Routing to Dr Talbert Nan,   [facility] to close encounter?

## 2020-11-29 NOTE — Telephone Encounter (Signed)
Will close the encounter  

## 2020-12-03 ENCOUNTER — Other Ambulatory Visit: Payer: Self-pay

## 2020-12-03 ENCOUNTER — Other Ambulatory Visit: Payer: Self-pay | Admitting: Obstetrics and Gynecology

## 2020-12-03 ENCOUNTER — Encounter: Payer: Self-pay | Admitting: Obstetrics and Gynecology

## 2020-12-03 ENCOUNTER — Ambulatory Visit (INDEPENDENT_AMBULATORY_CARE_PROVIDER_SITE_OTHER): Payer: 59 | Admitting: Obstetrics and Gynecology

## 2020-12-03 ENCOUNTER — Telehealth: Payer: Self-pay

## 2020-12-03 VITALS — BP 122/74 | HR 76 | Wt 290.4 lb

## 2020-12-03 DIAGNOSIS — B372 Candidiasis of skin and nail: Secondary | ICD-10-CM | POA: Diagnosis not present

## 2020-12-03 DIAGNOSIS — R238 Other skin changes: Secondary | ICD-10-CM | POA: Diagnosis not present

## 2020-12-03 MED ORDER — HYDROCORTISONE 2.5 % EX CREA: TOPICAL_CREAM | Freq: Two times a day (BID) | CUTANEOUS | 1 refills | Status: DC

## 2020-12-03 MED ORDER — NYSTATIN 100000 UNIT/GM EX POWD
1.0000 | Freq: Three times a day (TID) | CUTANEOUS | 2 refills | Status: DC
Start: 2020-12-03 — End: 2020-12-03

## 2020-12-03 MED FILL — HYDROCORTISONE 2.5% CREAM: 2.5 | 15 days supply | Qty: 30 | Fill #0

## 2020-12-03 MED FILL — NYSTATIN 100,000 UNIT/GM PO: 100000 | 5 days supply | Qty: 15 | Fill #0

## 2020-12-03 NOTE — Telephone Encounter (Signed)
Spoke with patient. Patient reports itching and irritation in groin area and top of vulva. Has been using Nystatin powder and vagisil with no relief, requesting OV. Denies any other symptoms.   Work in appt scheduled for today at  1:15pm with Dr. Oscar La. Patient verbalizes understanding and is agreeable.   Last AEX 08/15/20.   Encounter closed.

## 2020-12-03 NOTE — Progress Notes (Signed)
GYNECOLOGY  VISIT   HPI: 49 y.o.   Single Black or African American Not Hispanic or Latino  female   [phone] with No LMP recorded. (Menstrual status: IUD).   here for irritation in her groin. She states that she has been using the vagasil powder.  She was treated with nystatin a few months ago for candida, didn't help. No vaginal c/o.  Irritated in her groin, under her panus, in between her upper buttocks. No vulvar or vaginal irritation.   GYNECOLOGIC HISTORY: No LMP recorded. (Menstrual status: IUD). Contraception: IUD  Menopausal hormone therapy: none        OB History    Gravida  2   Para  2   Term  1   Preterm  1   AB  0   Living  1     SAB  0   IAB  0   Ectopic  0   Multiple  0   Live Births                 Patient Active Problem List   Diagnosis Date Noted  . Seizure disorder ([place]) 07/20/2020  . Right knee pain 07/13/2020  . Uterine leiomyoma 08/11/2019  . Prediabetes   . Elevated lipids   . Nutritional counseling 04/26/2019  . Simple obesity 02/19/2018  . Family history of aneurysm 12/09/2015  . Left hip pain 07/10/2015  . Back pain with left-sided sciatica 07/10/2015  . Hypothyroidism 06/06/2015  . 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 Obstructive sleep apnea 05/18/2015  . Breast calcifications on mammogram 05/18/2015  . Menorrhagia with regular cycle 05/18/2015  . Chronic pain of left knee 04/23/2015  . Severe obesity (BMI >= 40) ([place]) 03/15/2015  . Thyroid activity decreased 03/15/2015  . Vitamin D deficiency 03/15/2015  . Essential hypertension 02/15/2015    Past Medical History:  Diagnosis Date  . Anemia   . Elevated lipids   . Hypertension   . Olecranon bursitis of right elbow 02/15/2015   Suspect this was an acute gout flare triggered by HCTZ since she had no trauma   . OSA on CPAP   . Prediabetes   . Primary osteoarthritis of left knee 05/18/2015   Nonweightbearing x-rays revealed mild degenerative changes.  Referred to  [facility]  - saw [place] , [name]   . Seizures ([place])   . Thyroid disease    hypo  . Torn LEFT medial meniscus 04/2015   Left knee MRI confirmed but no instability sxs. Seen at [place] 04/2016 to review treatment plan. sxs responded to cortisone injection summer 2016 and 04/2016 so continue prn. Consider arthroscopy if sxs become unresponsive.    Past Surgical History:  Procedure Laterality Date  . INTRAUTERINE DEVICE (IUD) INSERTION  06/2016    Current Outpatient Medications  Medication Sig Dispense Refill  . EUTHYROX 100 MCG tablet Take 100 mcg by mouth every morning.    . hydrochlorothiazide (HYDRODIURIL) 25 MG tablet Take 1 tablet (25 mg total) by mouth daily. 90 tablet 3  . levonorgestrel (MIRENA) 20 MCG/24HR IUD 1 each by Intrauterine route once.    Marland Kitchen losartan (COZAAR) 100 MG tablet Take 1 tablet (100 mg total) by mouth daily. 90 tablet 3  . naproxen (NAPROSYN) 500 MG tablet TAKE 1 TABLET BY MOUTH EVERY 12 HOURS AS NEEDED FOR PAIN 60 tablet 3  . nystatin cream (MYCOSTATIN) Apply 1 application topically 2 (two) times daily. Apply to affected area BID for up to 7 days.  30 g 0  . Vitamin D, Ergocalciferol, (DRISDOL) 1.25 MG (50000 UNIT) CAPS capsule Take 1 capsule (50,000 Units total) by mouth every 7 (seven) days. 12 capsule 0   No current facility-administered medications for this visit.     ALLERGIES: Patient has no known allergies.  Family History  Problem Relation Age of Onset  . Hypertension Mother   . CVA Mother        occured while on lovenox  . Hypertension Brother   . Aneurysm Brother   . Diabetes Brother   . Diabetes Paternal Grandfather   . Renal Disease Father   . Breast cancer Neg Hx     Social History   Socioeconomic History  . Marital status: Single    Spouse name: Not on file  . Number of children: 1  . Years of education: HS  . Highest education level: Not on file  Occupational History  . Occupation: Medical illustrator:  [employer]  Tobacco Use  . Smoking status: Never Smoker  . Smokeless tobacco: Never Used  Vaping Use  . Vaping Use: Never used  Substance and Sexual Activity  . Alcohol use: No    Alcohol/week: 0.0 standard drinks  . Drug use: No  . Sexual activity: Yes    Partners: Male    Birth control/protection: I.U.D., Condom  Other Topics Concern  . Not on file  Social History Narrative      Drinks about 2-3 cups of coffee/tea a day. Education: [employer].   Social Determinants of Health   Financial Resource Strain: Not on file  Food Insecurity: Not on file  Transportation Needs: Not on file  Physical Activity: Not on file  Stress: Not on file  Social Connections: Not on file  Intimate Partner Violence: Not on file    Review of Systems  All other systems reviewed and are negative.   PHYSICAL EXAMINATION:    BP 122/74   Pulse 76   Wt 290 lb 6.4 oz (131.7 kg)   HC 67" (170.2 cm)   SpO2 99%   BMI 46.87 kg/m     General appearance: alert, cooperative and appears stated age Skin: under her panus and in her groin is increased pigmentation throughout. Bilateral groin with mild erythema  Pelvic: External genitalia:  no lesions              Perianal tissue with increased pigmentation.  Chaperone was present for exam.  ASSESSMENT Suspect candida intertrigo, chronic skin irritation    PLAN Will treat with nystatin powder and steroid ointment Discussed keeping the skin dry, hypoallergenic soaps and degerents # for Dermatology given

## 2020-12-03 NOTE — Patient Instructions (Signed)

## 2020-12-03 NOTE — Telephone Encounter (Signed)
Patient is calling in regards to "lower stomach itching". Would like to be seen.

## 2021-01-01 DIAGNOSIS — G40009 Localization-related (focal) (partial) idiopathic epilepsy and epileptic syndromes with seizures of localized onset, not intractable, without status epilepticus: Secondary | ICD-10-CM | POA: Diagnosis not present

## 2021-01-01 DIAGNOSIS — I10 Essential (primary) hypertension: Secondary | ICD-10-CM | POA: Diagnosis not present

## 2021-01-01 DIAGNOSIS — E038 Other specified hypothyroidism: Secondary | ICD-10-CM | POA: Diagnosis not present

## 2021-01-01 DIAGNOSIS — G40001 Localization-related (focal) (partial) idiopathic epilepsy and epileptic syndromes with seizures of localized onset, not intractable, with status epilepticus: Secondary | ICD-10-CM | POA: Diagnosis not present

## 2021-01-01 DIAGNOSIS — E1169 Type 2 diabetes mellitus with other specified complication: Secondary | ICD-10-CM | POA: Diagnosis not present

## 2021-01-22 DIAGNOSIS — E1169 Type 2 diabetes mellitus with other specified complication: Secondary | ICD-10-CM | POA: Diagnosis not present

## 2021-01-22 DIAGNOSIS — E038 Other specified hypothyroidism: Secondary | ICD-10-CM | POA: Diagnosis not present

## 2021-01-22 DIAGNOSIS — G40001 Localization-related (focal) (partial) idiopathic epilepsy and epileptic syndromes with seizures of localized onset, not intractable, with status epilepticus: Secondary | ICD-10-CM | POA: Diagnosis not present

## 2021-01-22 DIAGNOSIS — I10 Essential (primary) hypertension: Secondary | ICD-10-CM | POA: Diagnosis not present

## 2021-02-14 ENCOUNTER — Other Ambulatory Visit (HOSPITAL_COMMUNITY): Payer: Self-pay | Admitting: Gastroenterology

## 2021-02-14 DIAGNOSIS — Z1211 Encounter for screening for malignant neoplasm of colon: Secondary | ICD-10-CM | POA: Diagnosis not present

## 2021-02-14 MED FILL — CLENPIQ 10-3.5-12 MG-GM -GM: 10-3.5-12 M | 1 days supply | Qty: 320 | Fill #0

## 2021-02-15 ENCOUNTER — Other Ambulatory Visit: Payer: Self-pay | Admitting: Gastroenterology

## 2021-03-05 ENCOUNTER — Other Ambulatory Visit (HOSPITAL_COMMUNITY): Payer: Self-pay | Admitting: Family Medicine

## 2021-03-05 DIAGNOSIS — G40001 Localization-related (focal) (partial) idiopathic epilepsy and epileptic syndromes with seizures of localized onset, not intractable, with status epilepticus: Secondary | ICD-10-CM | POA: Diagnosis not present

## 2021-03-05 DIAGNOSIS — I10 Essential (primary) hypertension: Secondary | ICD-10-CM | POA: Diagnosis not present

## 2021-03-05 DIAGNOSIS — E1169 Type 2 diabetes mellitus with other specified complication: Secondary | ICD-10-CM | POA: Diagnosis not present

## 2021-03-05 DIAGNOSIS — E038 Other specified hypothyroidism: Secondary | ICD-10-CM | POA: Diagnosis not present

## 2021-03-12 ENCOUNTER — Other Ambulatory Visit (HOSPITAL_COMMUNITY): Payer: Self-pay

## 2021-03-12 MED FILL — Semaglutide Soln Pen-inj 1 MG/DOSE (4 MG/3ML): SUBCUTANEOUS | 84 days supply | Qty: 9 | Fill #0 | Status: AC

## 2021-03-13 ENCOUNTER — Other Ambulatory Visit (HOSPITAL_COMMUNITY): Payer: Self-pay

## 2021-03-14 ENCOUNTER — Other Ambulatory Visit (HOSPITAL_COMMUNITY): Payer: Self-pay

## 2021-03-18 DIAGNOSIS — E039 Hypothyroidism, unspecified: Secondary | ICD-10-CM | POA: Diagnosis not present

## 2021-03-18 DIAGNOSIS — I10 Essential (primary) hypertension: Secondary | ICD-10-CM | POA: Diagnosis not present

## 2021-03-18 DIAGNOSIS — R7301 Impaired fasting glucose: Secondary | ICD-10-CM | POA: Diagnosis not present

## 2021-03-18 DIAGNOSIS — E7800 Pure hypercholesterolemia, unspecified: Secondary | ICD-10-CM | POA: Diagnosis not present

## 2021-03-25 DIAGNOSIS — E7800 Pure hypercholesterolemia, unspecified: Secondary | ICD-10-CM | POA: Diagnosis not present

## 2021-03-25 DIAGNOSIS — R7301 Impaired fasting glucose: Secondary | ICD-10-CM | POA: Diagnosis not present

## 2021-03-25 DIAGNOSIS — E039 Hypothyroidism, unspecified: Secondary | ICD-10-CM | POA: Diagnosis not present

## 2021-03-25 DIAGNOSIS — I10 Essential (primary) hypertension: Secondary | ICD-10-CM | POA: Diagnosis not present

## 2021-03-25 DIAGNOSIS — E041 Nontoxic single thyroid nodule: Secondary | ICD-10-CM | POA: Diagnosis not present

## 2021-04-01 ENCOUNTER — Other Ambulatory Visit: Payer: Self-pay

## 2021-04-01 ENCOUNTER — Encounter (HOSPITAL_COMMUNITY): Payer: Self-pay | Admitting: Gastroenterology

## 2021-04-03 ENCOUNTER — Other Ambulatory Visit (HOSPITAL_COMMUNITY)
Admission: RE | Admit: 2021-04-03 | Discharge: 2021-04-03 | Disposition: A | Discharge status: Home / Self Care | Payer: 59 | Source: Ambulatory Visit | Attending: Gastroenterology | Admitting: Gastroenterology

## 2021-04-03 DIAGNOSIS — Z20822 Contact with and (suspected) exposure to covid-19: Secondary | ICD-10-CM | POA: Diagnosis not present

## 2021-04-03 DIAGNOSIS — Z01812 Encounter for preprocedural laboratory examination: Secondary | ICD-10-CM | POA: Diagnosis not present

## 2021-04-03 LAB — SARS CORONAVIRUS 2 (TAT 6-24 HRS): SARS Coronavirus 2: NEGATIVE

## 2021-04-05 ENCOUNTER — Ambulatory Visit (HOSPITAL_COMMUNITY)
Admission: RE | Admit: 2021-04-05 | Discharge: 2021-04-05 | Disposition: A | Discharge status: Home / Self Care | Payer: 59 | Attending: Gastroenterology | Admitting: Gastroenterology

## 2021-04-05 ENCOUNTER — Encounter (HOSPITAL_COMMUNITY): Payer: Self-pay | Admitting: Gastroenterology

## 2021-04-05 ENCOUNTER — Other Ambulatory Visit: Payer: Self-pay

## 2021-04-05 ENCOUNTER — Ambulatory Visit (HOSPITAL_COMMUNITY): Payer: 59 | Admitting: Anesthesiology

## 2021-04-05 ENCOUNTER — Encounter (HOSPITAL_COMMUNITY)
Admission: RE | Disposition: A | Discharge status: Home / Self Care | Payer: Self-pay | Source: Home / Self Care | Attending: Gastroenterology

## 2021-04-05 DIAGNOSIS — D123 Benign neoplasm of transverse colon: Secondary | ICD-10-CM | POA: Insufficient documentation

## 2021-04-05 DIAGNOSIS — Z1211 Encounter for screening for malignant neoplasm of colon: Secondary | ICD-10-CM | POA: Insufficient documentation

## 2021-04-05 DIAGNOSIS — I10 Essential (primary) hypertension: Secondary | ICD-10-CM | POA: Diagnosis not present

## 2021-04-05 DIAGNOSIS — G4733 Obstructive sleep apnea (adult) (pediatric): Secondary | ICD-10-CM | POA: Diagnosis not present

## 2021-04-05 DIAGNOSIS — K635 Polyp of colon: Secondary | ICD-10-CM | POA: Diagnosis not present

## 2021-04-05 DIAGNOSIS — Z6841 Body Mass Index (BMI) 40.0 and over, adult: Secondary | ICD-10-CM | POA: Insufficient documentation

## 2021-04-05 DIAGNOSIS — E039 Hypothyroidism, unspecified: Secondary | ICD-10-CM | POA: Diagnosis not present

## 2021-04-05 HISTORY — PX: POLYPECTOMY: SHX5525

## 2021-04-05 HISTORY — PX: COLONOSCOPY WITH PROPOFOL: SHX5780

## 2021-04-05 SURGERY — COLONOSCOPY WITH PROPOFOL
Anesthesia: Monitor Anesthesia Care

## 2021-04-05 MED ORDER — LACTATED RINGERS IV SOLN: INTRAVENOUS | Status: DC

## 2021-04-05 MED ORDER — PROPOFOL 500 MG/50ML IV EMUL
INTRAVENOUS | Status: DC | PRN
  Administered 2021-04-05: 200 ug/kg/min via INTRAVENOUS

## 2021-04-05 MED ORDER — LIDOCAINE HCL (CARDIAC) PF 100 MG/5ML IV SOSY
PREFILLED_SYRINGE | INTRAVENOUS | Status: DC | PRN
  Administered 2021-04-05: 60 mg via INTRAVENOUS

## 2021-04-05 MED ORDER — DEXMEDETOMIDINE (PRECEDEX) IN NS 20 MCG/5ML (4 MCG/ML) IV SYRINGE
PREFILLED_SYRINGE | INTRAVENOUS | Status: AC
  Filled 2021-04-05: qty 5

## 2021-04-05 MED ORDER — PROPOFOL 10 MG/ML IV BOLUS
INTRAVENOUS | Status: AC
  Filled 2021-04-05: qty 20

## 2021-04-05 MED ORDER — SODIUM CHLORIDE 0.9 % IV SOLN: INTRAVENOUS | Status: DC

## 2021-04-05 MED ORDER — DEXMEDETOMIDINE (PRECEDEX) IN NS 20 MCG/5ML (4 MCG/ML) IV SYRINGE
PREFILLED_SYRINGE | INTRAVENOUS | Status: DC | PRN
  Administered 2021-04-05: 12 ug via INTRAVENOUS

## 2021-04-05 MED ORDER — PROPOFOL 500 MG/50ML IV EMUL
INTRAVENOUS | Status: AC
  Filled 2021-04-05: qty 50

## 2021-04-05 SURGICAL SUPPLY — 21 items

## 2021-04-05 NOTE — H&P (Signed)
  Pam Mckee HPI: This 50 year old black female presents to the office for colorectal cancer screening. She has 1 BM per day or every other day. There is no obvious blood or mucus in the stool. She has good appetite and her weight has been stable. She started taking Ozempic last week for weight loss. She denies having any complaints of abdominal pain, nausea, vomiting, acid reflux, dysphagia or odynophagia. She denies having a family history of colon cancer, celiac sprue or IBD.  Past Medical History:  Diagnosis Date  . Anemia   . Elevated lipids   . Hypertension   . Olecranon bursitis of right elbow 02/15/2015   Suspect this was an acute gout flare triggered by HCTZ since she had no trauma   . OSA on CPAP   . Prediabetes   . Primary osteoarthritis of left knee 05/18/2015   Nonweightbearing x-rays revealed mild degenerative changes.  Referred to [name]  - saw [name] , [name]   . Seizures ([name])   . Thyroid disease    hypo  . Torn LEFT medial meniscus 04/2015   Left knee MRI confirmed but no instability sxs. Seen at [name] 04/2016 to review treatment plan. sxs responded to cortisone injection summer 2016 and 04/2016 so continue prn. Consider arthroscopy if sxs become unresponsive.    Past Surgical History:  Procedure Laterality Date  . INTRAUTERINE DEVICE (IUD) INSERTION  06/2016    Family History  Problem Relation Age of Onset  . Hypertension Mother   . CVA Mother        occured while on lovenox  . Hypertension Brother   . Aneurysm Brother   . Diabetes Brother   . Diabetes Paternal Grandfather   . Renal Disease Father   . Breast cancer Neg Hx     Social History:  reports that she has never smoked. She has never used smokeless tobacco. She reports that she does not drink alcohol and does not use drugs.  Allergies: No Known Allergies  Medications: Scheduled: Continuous:  No results found for this or any previous visit (from the past 24 hour(s)).   No  results found.  ROS:  As stated above in the HPI otherwise negative.  Height 5\' 7"  (1.702 m), weight 127 kg.    PE: Gen: NAD, Alert and Oriented HEENT:  [name], EOMI Neck: Supple, no LAD Lungs: CTA Bilaterally CV: RRR without M/G/R ABD: Soft, NTND, +BS Ext: No C/C/E  Assessment/Plan: 1) Screening colonoscopy.  Pam Mckee D 04/05/2021, 8:07 AM

## 2021-04-05 NOTE — Op Note (Signed)
[facility] Patient Name: Pam Mckee Procedure Date: 04/05/2021 MRN: [id number] Attending MD: Carol Ada , MD Date of Birth: [birth year] CSN: [id number] Age: 50 Admit Type: Outpatient Procedure:                Colonoscopy Indications:              Screening for colorectal malignant neoplasm Providers:                Carol Ada, MD, Kary Kos RN, RN, Fransico Setters                            [place], Technician Referring MD:              Medicines:                 Complications:            No immediate complications. Estimated Blood Loss:     Estimated blood loss: none. Procedure:                Pre-Anesthesia Assessment:                           - Prior to the procedure, a History and Physical                            was performed, and patient medications and                            allergies were reviewed. The patient's tolerance of                            previous anesthesia was also reviewed. The risks                            and benefits of the procedure and the sedation                            options and risks were discussed with the patient.                            All questions were answered, and informed consent                            was obtained. Prior Anticoagulants: The patient has                            taken no previous anticoagulant or antiplatelet                            agents. ASA Grade Assessment: III - A patient with                            severe systemic disease. After reviewing the risks                            and  benefits, the patient was deemed in                            satisfactory condition to undergo the procedure.                           - Sedation was administered by an anesthesia                            professional. Deep sedation was attained.                           After obtaining informed consent, the colonoscope                            was passed under direct vision. Throughout the                             procedure, the patient's blood pressure, pulse, and                            oxygen saturations were monitored continuously. The                            CF-HQ190L ([id number]) Olympus colonoscope was                            introduced through the anus and advanced to the the                            cecum, identified by appendiceal orifice and                            ileocecal valve. The colonoscopy was performed                            without difficulty. The patient tolerated the                            procedure well. The quality of the bowel                            preparation was excellent. The ileocecal valve,                            appendiceal orifice, and rectum were photographed. Scope In: 9:39:23 AM Scope Out: 9:51:48 AM Scope Withdrawal Time: 0 hours 9 minutes 15 seconds  Total Procedure Duration: 0 hours 12 minutes 25 seconds  Findings:      Two sessile polyps were found in the transverse colon. The polyps were 2       to 3 mm in size. These polyps were removed with a cold snare. Resection       and retrieval were complete. Impression:               - Two 2  to 3 mm polyps in the transverse colon,                            removed with a cold snare. Resected and retrieved. Moderate Sedation:      Not Applicable - Patient had care per Anesthesia. Recommendation:           - Patient has a contact number available for                            emergencies. The signs and symptoms of potential                            delayed complications were discussed with the                            patient. Return to normal activities tomorrow.                            Written discharge instructions were provided to the                            patient.                           - Resume previous diet.                           - Continue present medications.                           - Await pathology results.                            - Repeat colonoscopy in 7 years for surveillance. Procedure Code(s):        --- Professional ---                           [phone], Colonoscopy, flexible; with removal of                            tumor(s), polyp(s), or other lesion(s) by snare                            technique Diagnosis Code(s):        --- Professional ---                           Z12.11, Encounter for screening for malignant                            neoplasm of colon                           K63.5, Polyp of colon CPT copyright 2019 American Medical Association. All rights reserved. The codes documented in this report are preliminary and upon coder review may  be revised to  meet current compliance requirements. Carol Ada, MD Carol Ada, MD 04/05/2021 9:57:01 AM This report has been signed electronically. Number of Addenda: 0

## 2021-04-05 NOTE — Discharge Instructions (Signed)

## 2021-04-05 NOTE — Transfer of Care (Signed)
Immediate Anesthesia Transfer of Care Note  Patient: Pam Mckee  Procedure(s) Performed: COLONOSCOPY WITH PROPOFOL (N/A ) POLYPECTOMY  Patient Location: Endoscopy Unit  Anesthesia Type:MAC  Level of Consciousness: awake  Airway & Oxygen Therapy: Patient Spontanous Breathing and Patient connected to face mask oxygen  Post-op Assessment: Report given to RN and Post -op Vital signs reviewed and stable  Post vital signs: Reviewed and stable  Last Vitals:  Vitals Value Taken Time  BP    Temp    Pulse 82 04/05/21 0957  Resp 16 04/05/21 0957  SpO2 100 % 04/05/21 0957  Vitals shown include unvalidated device data.  Last Pain:  Vitals:   04/05/21 0831  TempSrc: Oral  PainSc: 0-No pain         Complications: No complications documented.

## 2021-04-05 NOTE — Anesthesia Preprocedure Evaluation (Addendum)
Anesthesia Evaluation  Patient identified by MRN, date of birth, ID band Patient awake    Reviewed: Allergy & Precautions, NPO status , Patient's Chart, lab work & pertinent test results  History of Anesthesia Complications Negative for: history of anesthetic complications  Airway Mallampati: III  TM Distance: >3 FB Neck ROM: Full    Dental  (+) Dental Advisory Given   Pulmonary sleep apnea and Continuous Positive Airway Pressure Ventilation ,    breath sounds clear to auscultation       Cardiovascular hypertension, Pt. on medications  Rhythm:Regular     Neuro/Psych Seizures -,   Neuromuscular disease negative psych ROS   GI/Hepatic negative GI ROS, Neg liver ROS,   Endo/Other  Hypothyroidism Morbid obesity  Renal/GU negative Renal ROSLab Results      Component                Value               Date                      CREATININE               0.76                07/17/2020           Lab Results      Component                Value               Date                      K                        3.7                 07/17/2020                Musculoskeletal  (+) Arthritis ,   Abdominal   Peds  Hematology Lab Results      Component                Value               Date                      WBC                      9.0                 03/08/2018                HGB                      11.7                03/08/2018                HCT                      37.4                03/08/2018                MCV  81                  03/08/2018                PLT                      353                 03/08/2018              Anesthesia Other Findings   Reproductive/Obstetrics                            Anesthesia Physical Anesthesia Plan  ASA: III  Anesthesia Plan: MAC   Post-op Pain Management:    Induction: Intravenous  PONV Risk Score and Plan: 2 and Propofol  infusion and Treatment may vary due to age or medical condition  Airway Management Planned: Nasal Cannula  Additional Equipment: None  Intra-op Plan:   Post-operative Plan:   Informed Consent: I have reviewed the patients History and Physical, chart, labs and discussed the procedure including the risks, benefits and alternatives for the proposed anesthesia with the patient or authorized representative who has indicated his/her understanding and acceptance.     Dental advisory given  Plan Discussed with: CRNA  Anesthesia Plan Comments:         Anesthesia Quick Evaluation

## 2021-04-05 NOTE — Anesthesia Procedure Notes (Signed)
Procedure Name: MAC Date/Time: 04/05/2021 9:32 AM Performed by: Lieutenant Diego, CRNA Pre-anesthesia Checklist: Patient identified, Emergency Drugs available, Suction available, Patient being monitored and Timeout performed Patient Re-evaluated:Patient Re-evaluated prior to induction Oxygen Delivery Method: Simple face mask Preoxygenation: Pre-oxygenation with 100% oxygen Induction Type: IV induction

## 2021-04-08 ENCOUNTER — Encounter (HOSPITAL_COMMUNITY): Payer: Self-pay | Admitting: Gastroenterology

## 2021-04-08 LAB — SURGICAL PATHOLOGY

## 2021-04-09 ENCOUNTER — Other Ambulatory Visit: Payer: Self-pay | Admitting: Family Medicine

## 2021-04-09 ENCOUNTER — Other Ambulatory Visit (HOSPITAL_COMMUNITY): Payer: Self-pay

## 2021-04-09 DIAGNOSIS — I10 Essential (primary) hypertension: Secondary | ICD-10-CM

## 2021-04-09 MED ORDER — ROSUVASTATIN CALCIUM 10 MG PO TABS
10.0000 mg | ORAL_TABLET | Freq: Every day | ORAL | 3 refills | Status: DC
  Filled 2021-04-09: qty 90, 90d supply, fill #0
  Filled 2021-06-21: qty 90, 90d supply, fill #1
  Filled 2021-10-18: qty 90, 90d supply, fill #2
  Filled 2021-10-18: qty 90, 90d supply, fill #0
  Filled 2022-01-10: qty 90, 90d supply, fill #1

## 2021-04-09 MED ORDER — LOSARTAN POTASSIUM 100 MG PO TABS
1.0000 | ORAL_TABLET | Freq: Every day | ORAL | 2 refills | Status: DC
  Filled 2021-04-09: qty 90, 90d supply, fill #0
  Filled 2021-06-21: qty 90, 90d supply, fill #1
  Filled 2021-10-18: qty 90, 90d supply, fill #0
  Filled 2021-10-18: qty 90, 90d supply, fill #2

## 2021-04-09 MED ORDER — HYDROCHLOROTHIAZIDE 25 MG PO TABS
1.0000 | ORAL_TABLET | Freq: Every day | ORAL | 2 refills | Status: DC
  Filled 2021-04-09: qty 90, 90d supply, fill #0
  Filled 2021-06-21: qty 90, 90d supply, fill #1
  Filled 2021-10-18: qty 90, 90d supply, fill #2
  Filled 2021-10-18: qty 90, 90d supply, fill #0

## 2021-04-09 NOTE — Anesthesia Postprocedure Evaluation (Signed)
Anesthesia Post Note  Patient: Pam Mckee  Procedure(s) Performed: COLONOSCOPY WITH PROPOFOL (N/A ) POLYPECTOMY     Patient location during evaluation: Endoscopy Anesthesia Type: MAC Level of consciousness: awake and alert Pain management: pain level controlled Vital Signs Assessment: post-procedure vital signs reviewed and stable Respiratory status: spontaneous breathing, nonlabored ventilation, respiratory function stable and patient connected to nasal cannula oxygen Cardiovascular status: stable and blood pressure returned to baseline Postop Assessment: no apparent nausea or vomiting Anesthetic complications: no   No complications documented.  Last Vitals:  Vitals:   04/05/21 1020 04/05/21 1030  BP: 127/75 (!) 142/80  Pulse: 61 66  Resp: (!) 8 13  Temp:    SpO2: 100% 99%    Last Pain:  Vitals:   04/05/21 1030  TempSrc:   PainSc: 0-No pain                 Ulonda Klosowski

## 2021-06-05 ENCOUNTER — Other Ambulatory Visit (HOSPITAL_COMMUNITY): Payer: Self-pay

## 2021-06-05 DIAGNOSIS — G40001 Localization-related (focal) (partial) idiopathic epilepsy and epileptic syndromes with seizures of localized onset, not intractable, with status epilepticus: Secondary | ICD-10-CM | POA: Diagnosis not present

## 2021-06-05 DIAGNOSIS — E7800 Pure hypercholesterolemia, unspecified: Secondary | ICD-10-CM | POA: Diagnosis not present

## 2021-06-05 DIAGNOSIS — E1169 Type 2 diabetes mellitus with other specified complication: Secondary | ICD-10-CM | POA: Diagnosis not present

## 2021-06-05 DIAGNOSIS — I10 Essential (primary) hypertension: Secondary | ICD-10-CM | POA: Diagnosis not present

## 2021-06-05 MED ORDER — OZEMPIC (2 MG/DOSE) 8 MG/3ML ~~LOC~~ SOPN
2.0000 mg | PEN_INJECTOR | SUBCUTANEOUS | 0 refills | Status: DC
  Filled 2021-06-05 – 2021-06-06 (×2): qty 9, 84d supply, fill #0

## 2021-06-06 ENCOUNTER — Other Ambulatory Visit (HOSPITAL_COMMUNITY): Payer: Self-pay

## 2021-06-21 ENCOUNTER — Other Ambulatory Visit (HOSPITAL_COMMUNITY): Payer: Self-pay

## 2021-06-28 ENCOUNTER — Other Ambulatory Visit (HOSPITAL_COMMUNITY): Payer: Self-pay

## 2021-07-03 ENCOUNTER — Other Ambulatory Visit (HOSPITAL_COMMUNITY): Payer: Self-pay

## 2021-08-19 ENCOUNTER — Ambulatory Visit (INDEPENDENT_AMBULATORY_CARE_PROVIDER_SITE_OTHER): Payer: 59 | Admitting: Obstetrics and Gynecology

## 2021-08-19 ENCOUNTER — Other Ambulatory Visit: Payer: Self-pay

## 2021-08-19 ENCOUNTER — Encounter: Payer: Self-pay | Admitting: Obstetrics and Gynecology

## 2021-08-19 VITALS — BP 110/64 | HR 79 | Ht 65.75 in | Wt 256.0 lb

## 2021-08-19 DIAGNOSIS — Z30431 Encounter for routine checking of intrauterine contraceptive device: Secondary | ICD-10-CM

## 2021-08-19 DIAGNOSIS — Z1211 Encounter for screening for malignant neoplasm of colon: Secondary | ICD-10-CM | POA: Insufficient documentation

## 2021-08-19 DIAGNOSIS — Z01419 Encounter for gynecological examination (general) (routine) without abnormal findings: Secondary | ICD-10-CM

## 2021-08-19 NOTE — Patient Instructions (Signed)

## 2021-08-19 NOTE — Progress Notes (Signed)
50 y.o. G48P1101 Single Black or African American Not Hispanic or Latino female here for annual exam.  She has a mirena IUD, placed in 7/17. Originally placed for AUB, not having any cycles.  She just got married last weekend.  No dyspareunia.   No medical changes. She has a new primary, labs are up to date (not in epic/care everywhere).  She has lost 40 lbs since 2/22.     No LMP recorded. (Menstrual status: IUD).          Sexually active: Yes.    The current method of family planning is IUD.    Exercising:   The patient does not participate in regular exercise at present. Smoker:  no  Health Maintenance: Pap:   08/04/2018 WNL NEG HPV,05/18/2015 WNL  History of abnormal Pap:  no MMG:  11/24/19 density B bi-rads 1 neg  BMD:   none  Colonoscopy: 04/05/21 polyps follow up 7 years  TDaP:  2013  Gardasil: n/a   reports that she has never smoked. She has never used smokeless tobacco. She reports that she does not drink alcohol and does not use drugs.She works as a Occupational psychologist, and a Hospital doctor. Son is 40 lives in [place], working for transit. He wants to be a Marine scientist.   Past Medical History:  Diagnosis Date   Anemia    Elevated lipids    Hypertension    Olecranon bursitis of right elbow 02/15/2015   Suspect this was an acute gout flare triggered by HCTZ since she had no trauma    OSA on CPAP    Prediabetes    Primary osteoarthritis of left knee 05/18/2015   Nonweightbearing x-rays revealed mild degenerative changes.  Referred to [facility]  - saw Ellison Hughs , PA-C    Seizures [facility])    Thyroid disease    hypo   Torn LEFT medial meniscus 04/2015   Left knee MRI confirmed but no instability sxs. Seen at [place] 04/2016 to review treatment plan. sxs responded to cortisone injection summer 2016 and 04/2016 so continue prn. Consider arthroscopy if sxs become unresponsive.    Past Surgical History:  Procedure Laterality Date   COLONOSCOPY WITH PROPOFOL N/A 04/05/2021    Procedure: COLONOSCOPY WITH PROPOFOL;  Surgeon: Carol Ada, MD;  Location: [facility];  Service: Endoscopy;  Laterality: N/A;   INTRAUTERINE DEVICE (IUD) INSERTION  06/2016   POLYPECTOMY  04/05/2021   Procedure: POLYPECTOMY;  Surgeon: Carol Ada, MD;  Location: [facility];  Service: Endoscopy;;    Current Outpatient Medications  Medication Sig Dispense Refill   hydrochlorothiazide (HYDRODIURIL) 25 MG tablet TAKE 1 TABLET (25 MG TOTAL) BY MOUTH DAILY. 90 tablet 2   levonorgestrel (MIRENA) 20 MCG/24HR IUD 1 each by Intrauterine route once.     levothyroxine (SYNTHROID) 112 MCG tablet Take 112 mcg by mouth daily before breakfast.     losartan (COZAAR) 100 MG tablet TAKE 1 TABLET (100 MG TOTAL) BY MOUTH DAILY. 90 tablet 2   rosuvastatin (CRESTOR) 10 MG tablet TAKE 1 TABLET BY MOUTH ONCE A DAY 90 tablet 3   Semaglutide, 2 MG/DOSE, (OZEMPIC, 2 MG/DOSE,) 8 MG/3ML SOPN Inject 2 mg as directed once a week. 9 mL 0   No current facility-administered medications for this visit.    Family History  Problem Relation Age of Onset   Hypertension Mother    CVA Mother        occured while on lovenox   Hypertension Brother    Aneurysm Brother  Diabetes Brother    Diabetes Paternal Grandfather    Renal Disease Father    Breast cancer Neg Hx     Review of Systems  Neurological:  Negative for facial asymmetry.  All other systems reviewed and are negative.  Exam:   BP 110/64   Pulse 79   Ht 5' 5.75" (1.67 m)   Wt 256 lb (116.1 kg)   SpO2 100%   BMI 41.64 kg/m   Weight change: '@WEIGHTCHANGE'$ @ Height:   Height: 5' 5.75" (167 cm)  Ht Readings from Last 3 Encounters:  08/19/21 5' 5.75" (1.67 m)  04/05/21 '5\' 6"'$  (1.676 m)  08/15/20 '5\' 6"'$  (1.676 m)    General appearance: alert, cooperative and appears stated age Head: Normocephalic, without obvious abnormality, atraumatic Neck: no adenopathy, supple, symmetrical, trachea midline and thyroid normal to inspection and palpation Lungs:  clear to auscultation bilaterally Cardiovascular: regular rate and rhythm Breasts: normal appearance, no masses or tenderness Abdomen: soft, non-tender; non distended,  no masses,  no organomegaly Extremities: extremities normal, atraumatic, no cyanosis or edema Skin: Skin color, texture, turgor normal. No rashes or lesions Lymph nodes: Cervical, supraclavicular, and axillary nodes normal. No abnormal inguinal nodes palpated Neurologic: Grossly normal   Pelvic: External genitalia:  no lesions              Urethra:  normal appearing urethra with no masses, tenderness or lesions              Bartholins and Skenes: normal                 Vagina: normal appearing vagina with normal color and discharge, no lesions              Cervix: no lesions and IUD string 4 cm               Bimanual Exam:  Uterus:   not tender, ?8 week sized, exam limited by BMI              Adnexa: no mass, fullness, tenderness               Rectovaginal: Confirms               Anus:  normal sphincter tone, no lesions  Gae Dry chaperoned for the exam.  1. Well woman exam Discussed breast self exam Discussed calcium and vit D intake Labs with primary Mammogram due, she will schedule Colonoscopy UTD  2. IUD check up Doing well

## 2021-08-27 ENCOUNTER — Other Ambulatory Visit: Payer: Self-pay | Admitting: Family Medicine

## 2021-08-27 DIAGNOSIS — Z1231 Encounter for screening mammogram for malignant neoplasm of breast: Secondary | ICD-10-CM

## 2021-08-29 ENCOUNTER — Other Ambulatory Visit: Payer: Self-pay

## 2021-08-29 ENCOUNTER — Ambulatory Visit
Admission: RE | Admit: 2021-08-29 | Discharge: 2021-08-29 | Disposition: A | Discharge status: Home / Self Care | Payer: 59 | Source: Ambulatory Visit | Attending: Family Medicine | Admitting: Family Medicine

## 2021-08-29 DIAGNOSIS — Z1231 Encounter for screening mammogram for malignant neoplasm of breast: Secondary | ICD-10-CM

## 2021-09-05 DIAGNOSIS — E1169 Type 2 diabetes mellitus with other specified complication: Secondary | ICD-10-CM | POA: Diagnosis not present

## 2021-09-05 DIAGNOSIS — E7800 Pure hypercholesterolemia, unspecified: Secondary | ICD-10-CM | POA: Diagnosis not present

## 2021-09-05 DIAGNOSIS — G40009 Localization-related (focal) (partial) idiopathic epilepsy and epileptic syndromes with seizures of localized onset, not intractable, without status epilepticus: Secondary | ICD-10-CM | POA: Diagnosis not present

## 2021-09-05 DIAGNOSIS — I10 Essential (primary) hypertension: Secondary | ICD-10-CM | POA: Diagnosis not present

## 2021-09-05 DIAGNOSIS — E559 Vitamin D deficiency, unspecified: Secondary | ICD-10-CM | POA: Diagnosis not present

## 2021-09-05 DIAGNOSIS — G40001 Localization-related (focal) (partial) idiopathic epilepsy and epileptic syndromes with seizures of localized onset, not intractable, with status epilepticus: Secondary | ICD-10-CM | POA: Diagnosis not present

## 2021-09-05 DIAGNOSIS — E038 Other specified hypothyroidism: Secondary | ICD-10-CM | POA: Diagnosis not present

## 2021-09-06 ENCOUNTER — Other Ambulatory Visit (HOSPITAL_COMMUNITY): Payer: Self-pay

## 2021-10-17 ENCOUNTER — Other Ambulatory Visit (HOSPITAL_COMMUNITY): Payer: Self-pay

## 2021-10-17 DIAGNOSIS — I10 Essential (primary) hypertension: Secondary | ICD-10-CM | POA: Diagnosis not present

## 2021-10-17 DIAGNOSIS — G40001 Localization-related (focal) (partial) idiopathic epilepsy and epileptic syndromes with seizures of localized onset, not intractable, with status epilepticus: Secondary | ICD-10-CM | POA: Diagnosis not present

## 2021-10-17 DIAGNOSIS — E1169 Type 2 diabetes mellitus with other specified complication: Secondary | ICD-10-CM | POA: Diagnosis not present

## 2021-10-17 MED ORDER — VITAMIN D (ERGOCALCIFEROL) 1.25 MG (50000 UNIT) PO CAPS
50000.0000 [IU] | ORAL_CAPSULE | ORAL | 3 refills | Status: DC
  Filled 2021-10-17 – 2022-01-10 (×2): qty 12, 84d supply, fill #0
  Filled 2022-08-19: qty 12, 84d supply, fill #1

## 2021-10-17 MED ORDER — OZEMPIC (2 MG/DOSE) 8 MG/3ML ~~LOC~~ SOPN
2.0000 mg | PEN_INJECTOR | SUBCUTANEOUS | 0 refills | Status: DC
  Filled 2021-10-17: qty 3, 28d supply, fill #0
  Filled 2021-11-07 – 2021-11-08 (×2): qty 3, 28d supply, fill #1
  Filled 2021-12-06 (×3): qty 3, 28d supply, fill #2

## 2021-10-18 ENCOUNTER — Other Ambulatory Visit (HOSPITAL_COMMUNITY): Payer: Self-pay

## 2021-11-07 ENCOUNTER — Other Ambulatory Visit (HOSPITAL_COMMUNITY): Payer: Self-pay

## 2021-11-08 ENCOUNTER — Other Ambulatory Visit (HOSPITAL_COMMUNITY): Payer: Self-pay

## 2021-11-29 ENCOUNTER — Other Ambulatory Visit (HOSPITAL_COMMUNITY): Payer: Self-pay

## 2021-12-06 ENCOUNTER — Other Ambulatory Visit (HOSPITAL_COMMUNITY): Payer: Self-pay

## 2021-12-06 MED ORDER — OZEMPIC (2 MG/DOSE) 8 MG/3ML ~~LOC~~ SOPN
2.0000 mg | PEN_INJECTOR | SUBCUTANEOUS | 0 refills | Status: DC
  Filled 2021-12-06: qty 9, 84d supply, fill #0

## 2022-01-10 ENCOUNTER — Other Ambulatory Visit (HOSPITAL_COMMUNITY): Payer: Self-pay

## 2022-01-10 MED ORDER — HYDROCHLOROTHIAZIDE 25 MG PO TABS
25.0000 mg | ORAL_TABLET | Freq: Every day | ORAL | 2 refills | Status: DC
  Filled 2022-01-10: qty 90, 90d supply, fill #0
  Filled 2022-04-23: qty 90, 90d supply, fill #1
  Filled 2022-10-21: qty 90, 90d supply, fill #2

## 2022-01-10 MED ORDER — LOSARTAN POTASSIUM 100 MG PO TABS
100.0000 mg | ORAL_TABLET | Freq: Every day | ORAL | 2 refills | Status: DC
  Filled 2022-01-10: qty 90, 90d supply, fill #0
  Filled 2022-04-23: qty 90, 90d supply, fill #1
  Filled 2022-10-21: qty 90, 90d supply, fill #2

## 2022-01-17 DIAGNOSIS — E1169 Type 2 diabetes mellitus with other specified complication: Secondary | ICD-10-CM | POA: Diagnosis not present

## 2022-01-17 DIAGNOSIS — G40001 Localization-related (focal) (partial) idiopathic epilepsy and epileptic syndromes with seizures of localized onset, not intractable, with status epilepticus: Secondary | ICD-10-CM | POA: Diagnosis not present

## 2022-01-17 DIAGNOSIS — E038 Other specified hypothyroidism: Secondary | ICD-10-CM | POA: Diagnosis not present

## 2022-01-17 DIAGNOSIS — I10 Essential (primary) hypertension: Secondary | ICD-10-CM | POA: Diagnosis not present

## 2022-02-13 ENCOUNTER — Other Ambulatory Visit: Payer: Self-pay | Admitting: Endocrinology

## 2022-02-13 DIAGNOSIS — E041 Nontoxic single thyroid nodule: Secondary | ICD-10-CM

## 2022-04-18 DIAGNOSIS — H524 Presbyopia: Secondary | ICD-10-CM | POA: Diagnosis not present

## 2022-04-23 ENCOUNTER — Other Ambulatory Visit (HOSPITAL_COMMUNITY): Payer: Self-pay

## 2022-04-23 MED ORDER — SEMAGLUTIDE (2 MG/DOSE) 8 MG/3ML ~~LOC~~ SOPN
2.0000 mg | PEN_INJECTOR | SUBCUTANEOUS | 0 refills | Status: DC
  Filled 2022-04-23: qty 9, 84d supply, fill #0

## 2022-05-10 ENCOUNTER — Other Ambulatory Visit (HOSPITAL_COMMUNITY): Payer: Self-pay

## 2022-05-19 ENCOUNTER — Other Ambulatory Visit (HOSPITAL_COMMUNITY): Payer: Self-pay

## 2022-05-19 DIAGNOSIS — I10 Essential (primary) hypertension: Secondary | ICD-10-CM | POA: Diagnosis not present

## 2022-05-19 DIAGNOSIS — E038 Other specified hypothyroidism: Secondary | ICD-10-CM | POA: Diagnosis not present

## 2022-05-19 DIAGNOSIS — E1169 Type 2 diabetes mellitus with other specified complication: Secondary | ICD-10-CM | POA: Diagnosis not present

## 2022-05-19 DIAGNOSIS — G40001 Localization-related (focal) (partial) idiopathic epilepsy and epileptic syndromes with seizures of localized onset, not intractable, with status epilepticus: Secondary | ICD-10-CM | POA: Diagnosis not present

## 2022-05-19 MED ORDER — ROSUVASTATIN CALCIUM 10 MG PO TABS
10.0000 mg | ORAL_TABLET | Freq: Every day | ORAL | 1 refills | Status: DC
  Filled 2022-05-19: qty 90, 90d supply, fill #0
  Filled 2022-10-21: qty 90, 90d supply, fill #1

## 2022-05-19 MED ORDER — MOUNJARO 15 MG/0.5ML ~~LOC~~ SOAJ
15.0000 mg | SUBCUTANEOUS | 0 refills | Status: DC
  Filled 2022-05-19 – 2022-05-20 (×2): qty 6, 84d supply, fill #0
  Filled 2022-05-27: qty 2, 28d supply, fill #0

## 2022-05-20 ENCOUNTER — Other Ambulatory Visit (HOSPITAL_COMMUNITY): Payer: Self-pay

## 2022-05-27 ENCOUNTER — Other Ambulatory Visit (HOSPITAL_COMMUNITY): Payer: Self-pay

## 2022-05-30 ENCOUNTER — Other Ambulatory Visit (HOSPITAL_COMMUNITY): Payer: Self-pay

## 2022-06-13 ENCOUNTER — Other Ambulatory Visit (HOSPITAL_COMMUNITY): Payer: Self-pay

## 2022-08-18 DIAGNOSIS — I10 Essential (primary) hypertension: Secondary | ICD-10-CM | POA: Diagnosis not present

## 2022-08-18 DIAGNOSIS — E1169 Type 2 diabetes mellitus with other specified complication: Secondary | ICD-10-CM | POA: Diagnosis not present

## 2022-08-18 DIAGNOSIS — E559 Vitamin D deficiency, unspecified: Secondary | ICD-10-CM | POA: Diagnosis not present

## 2022-08-19 ENCOUNTER — Other Ambulatory Visit (HOSPITAL_COMMUNITY): Payer: Self-pay

## 2022-08-19 DIAGNOSIS — I10 Essential (primary) hypertension: Secondary | ICD-10-CM | POA: Diagnosis not present

## 2022-08-19 DIAGNOSIS — E781 Pure hyperglyceridemia: Secondary | ICD-10-CM | POA: Diagnosis not present

## 2022-08-19 DIAGNOSIS — Z6841 Body Mass Index (BMI) 40.0 and over, adult: Secondary | ICD-10-CM | POA: Diagnosis not present

## 2022-08-21 ENCOUNTER — Other Ambulatory Visit (HOSPITAL_COMMUNITY): Payer: Self-pay

## 2022-08-27 ENCOUNTER — Other Ambulatory Visit (HOSPITAL_COMMUNITY): Payer: Self-pay

## 2022-08-28 ENCOUNTER — Other Ambulatory Visit (HOSPITAL_COMMUNITY): Payer: Self-pay

## 2022-09-02 NOTE — Progress Notes (Signed)
51 y.o. G60P1101 Single Black or African American Not Hispanic or Latino female here for annual exam.  She has a mirena IUD, placed in 7/17. No bleeding. Some mild vaginal dryness, no vasomotor symptoms.     No LMP recorded. (Menstrual status: IUD).          Sexually active: Yes.    The current method of family planning is IUD.    Exercising: No.  The patient does not participate in regular exercise at present. Smoker:  no  Health Maintenance: Pap:   08/04/2018 WNL NEG HPV,05/18/2015 WNL  History of abnormal Pap:  no MMG:  09/03/21 density B Bi-rads 1 neg  BMD:   n/a Colonoscopy: 04/05/21 polyps follow up 7 years  TDaP:  2013 Gardasil: n/a   reports that she has never smoked. She has never used smokeless tobacco. She reports that she does not drink alcohol and does not use drugs. She works as a Occupational psychologist, and a Hospital doctor.Son is 67, lives in [place].   Past Medical History:  Diagnosis Date   Anemia    Elevated lipids    Hypertension    Olecranon bursitis of right elbow 02/15/2015   Suspect this was an acute gout flare triggered by HCTZ since she had no trauma    OSA on CPAP    Prediabetes    Primary osteoarthritis of left knee 05/18/2015   Nonweightbearing x-rays revealed mild degenerative changes.  Referred to [facility]  - saw Ellison Hughs , PA-C    Seizures [facility])    Thyroid disease    hypo   Torn LEFT medial meniscus 04/2015   Left knee MRI confirmed but no instability sxs. Seen at [place] 04/2016 to review treatment plan. sxs responded to cortisone injection summer 2016 and 04/2016 so continue prn. Consider arthroscopy if sxs become unresponsive.    Past Surgical History:  Procedure Laterality Date   COLONOSCOPY WITH PROPOFOL N/A 04/05/2021   Procedure: COLONOSCOPY WITH PROPOFOL;  Surgeon: Carol Ada, MD;  Location: [facility];  Service: Endoscopy;  Laterality: N/A;   INTRAUTERINE DEVICE (IUD) INSERTION  06/2016   POLYPECTOMY  04/05/2021   Procedure:  POLYPECTOMY;  Surgeon: Carol Ada, MD;  Location: [facility];  Service: Endoscopy;;    Current Outpatient Medications  Medication Sig Dispense Refill   hydrochlorothiazide (HYDRODIURIL) 25 MG tablet TAKE 1 TABLET (25 MG TOTAL) BY MOUTH DAILY. 90 tablet 2   levonorgestrel (MIRENA) 20 MCG/24HR IUD 1 each by Intrauterine route once.     levothyroxine (SYNTHROID) 112 MCG tablet Take 112 mcg by mouth daily before breakfast.     losartan (COZAAR) 100 MG tablet TAKE 1 TABLET (100 MG TOTAL) BY MOUTH DAILY. 90 tablet 2   rosuvastatin (CRESTOR) 10 MG tablet TAKE 1 TABLET BY MOUTH ONCE A DAY 90 tablet 3   rosuvastatin (CRESTOR) 10 MG tablet Take 1 tablet (10 mg total) by mouth daily. 90 tablet 1   Semaglutide, 2 MG/DOSE, (OZEMPIC, 2 MG/DOSE,) 8 MG/3ML SOPN Inject 2 mg as directed once a week. 9 mL 0   Semaglutide, 2 MG/DOSE, (OZEMPIC, 2 MG/DOSE,) 8 MG/3ML SOPN Inject 2 mg into the skin once a week. 9 mL 0   Semaglutide, 2 MG/DOSE, (OZEMPIC, 2 MG/DOSE,) 8 MG/3ML SOPN Inject 2 mg into the skin once a week. 9 mL 0   Vitamin D, Ergocalciferol, (DRISDOL) 1.25 MG (50000 UNIT) CAPS capsule Take 1 capsule (50,000 Units total) by mouth once a week. 12 capsule 3   Semaglutide-Weight Management 2.4  MG/0.75ML SOAJ Inject 2.4 mg into the skin once a week. (Patient not taking: Reported on 09/08/2022) 2 mL 1   No current facility-administered medications for this visit.    Family History  Problem Relation Age of Onset   Hypertension Mother    CVA Mother        occured while on lovenox   Hypertension Brother    Aneurysm Brother    Diabetes Brother    Diabetes Paternal Grandfather    Renal Disease Father    Breast cancer Neg Hx     Review of Systems  All other systems reviewed and are negative.   Exam:   BP 128/64   Pulse 72   Ht '5\' 6"'$  (1.676 m)   Wt 266 lb (120.7 kg)   SpO2 100%   BMI 42.93 kg/m   Weight change: '@WEIGHTCHANGE'$ @ Height:   Height: '5\' 6"'$  (167.6 cm)  Ht Readings from Last 3  Encounters:  09/08/22 '5\' 6"'$  (1.676 m)  08/19/21 5' 5.75" (1.67 m)  04/05/21 '5\' 6"'$  (1.676 m)    General appearance: alert, cooperative and appears stated age Head: Normocephalic, without obvious abnormality, atraumatic Neck: no adenopathy, supple, symmetrical, trachea midline and thyroid normal to inspection and palpation Lungs: clear to auscultation bilaterally Cardiovascular: regular rate and rhythm Breasts: normal appearance, no masses or tenderness Abdomen: soft, non-tender; non distended,  no masses,  no organomegaly Extremities: extremities normal, atraumatic, no cyanosis or edema Skin: Skin color, texture, turgor normal. Increased pigmentation and irritation noted under her panus and in bilateral groin regions. Lymph nodes: Cervical, supraclavicular, and axillary nodes normal. No abnormal inguinal nodes palpated Neurologic: Grossly normal   Pelvic: External genitalia:  no lesions              Urethra:  normal appearing urethra with no masses, tenderness or lesions              Bartholins and Skenes: normal                 Vagina: normal appearing vagina with normal color and discharge, no lesions              Cervix: no lesions and IUD string 3 cm               Bimanual Exam:  Uterus:   no obvious masses              Adnexa: no mass, fullness, tenderness               Rectovaginal: Confirms               Anus:  normal sphincter tone, no lesions  Gae Dry chaperoned for the exam.  1. Well woman exam Discussed breast self exam Discussed calcium and vit D intake Pap due next year Mammogram due, she will schedule Colonoscopy UTD  2. IUD check up Doing well  3. Candidal intertrigo - nystatin cream (MYCOSTATIN); Apply 1 Application topically 2 (two) times daily. Apply to affected area BID for up to 7 days.  Dispense: 30 g; Refill: 1  4. Immunization due - Tdap vaccine greater than or equal to 7yo IM

## 2022-09-03 ENCOUNTER — Other Ambulatory Visit (HOSPITAL_COMMUNITY): Payer: Self-pay

## 2022-09-05 ENCOUNTER — Other Ambulatory Visit (HOSPITAL_COMMUNITY): Payer: Self-pay

## 2022-09-05 MED ORDER — SEMAGLUTIDE-WEIGHT MANAGEMENT 2.4 MG/0.75ML ~~LOC~~ SOAJ
2.4000 mg | SUBCUTANEOUS | 1 refills | Status: DC
  Filled 2022-09-05 – 2022-09-11 (×2): qty 2, 28d supply, fill #0
  Filled 2022-09-18 – 2022-10-10 (×3): qty 3, 28d supply, fill #0

## 2022-09-08 ENCOUNTER — Encounter: Payer: Self-pay | Admitting: Obstetrics and Gynecology

## 2022-09-08 ENCOUNTER — Ambulatory Visit (INDEPENDENT_AMBULATORY_CARE_PROVIDER_SITE_OTHER): Payer: 59 | Admitting: Obstetrics and Gynecology

## 2022-09-08 ENCOUNTER — Other Ambulatory Visit (HOSPITAL_COMMUNITY): Payer: Self-pay

## 2022-09-08 VITALS — BP 128/64 | HR 72 | Ht 66.0 in | Wt 266.0 lb

## 2022-09-08 DIAGNOSIS — Z23 Encounter for immunization: Secondary | ICD-10-CM | POA: Diagnosis not present

## 2022-09-08 DIAGNOSIS — Z30431 Encounter for routine checking of intrauterine contraceptive device: Secondary | ICD-10-CM

## 2022-09-08 DIAGNOSIS — B372 Candidiasis of skin and nail: Secondary | ICD-10-CM | POA: Diagnosis not present

## 2022-09-08 DIAGNOSIS — Z01419 Encounter for gynecological examination (general) (routine) without abnormal findings: Secondary | ICD-10-CM | POA: Diagnosis not present

## 2022-09-08 MED ORDER — NYSTATIN 100000 UNIT/GM EX CREA
1.0000 | TOPICAL_CREAM | Freq: Two times a day (BID) | CUTANEOUS | 1 refills | Status: DC
  Filled 2022-09-08: qty 30, 7d supply, fill #0
  Filled 2022-11-20 – 2022-12-05 (×2): qty 30, 7d supply, fill #1

## 2022-09-08 NOTE — Patient Instructions (Signed)

## 2022-09-11 ENCOUNTER — Other Ambulatory Visit (HOSPITAL_COMMUNITY): Payer: Self-pay

## 2022-09-18 ENCOUNTER — Other Ambulatory Visit (HOSPITAL_COMMUNITY): Payer: Self-pay

## 2022-09-26 ENCOUNTER — Other Ambulatory Visit (HOSPITAL_COMMUNITY): Payer: Self-pay

## 2022-09-29 ENCOUNTER — Other Ambulatory Visit (HOSPITAL_COMMUNITY): Payer: Self-pay

## 2022-10-10 ENCOUNTER — Other Ambulatory Visit (HOSPITAL_COMMUNITY): Payer: Self-pay

## 2022-10-21 ENCOUNTER — Other Ambulatory Visit (HOSPITAL_COMMUNITY): Payer: Self-pay

## 2022-11-06 ENCOUNTER — Other Ambulatory Visit (HOSPITAL_COMMUNITY): Payer: Self-pay

## 2022-11-17 DIAGNOSIS — G40001 Localization-related (focal) (partial) idiopathic epilepsy and epileptic syndromes with seizures of localized onset, not intractable, with status epilepticus: Secondary | ICD-10-CM | POA: Diagnosis not present

## 2022-11-17 DIAGNOSIS — E1169 Type 2 diabetes mellitus with other specified complication: Secondary | ICD-10-CM | POA: Diagnosis not present

## 2022-11-17 DIAGNOSIS — I10 Essential (primary) hypertension: Secondary | ICD-10-CM | POA: Diagnosis not present

## 2022-11-18 DIAGNOSIS — I10 Essential (primary) hypertension: Secondary | ICD-10-CM | POA: Diagnosis not present

## 2022-11-18 DIAGNOSIS — E1169 Type 2 diabetes mellitus with other specified complication: Secondary | ICD-10-CM | POA: Diagnosis not present

## 2022-11-18 DIAGNOSIS — G40001 Localization-related (focal) (partial) idiopathic epilepsy and epileptic syndromes with seizures of localized onset, not intractable, with status epilepticus: Secondary | ICD-10-CM | POA: Diagnosis not present

## 2022-11-19 ENCOUNTER — Other Ambulatory Visit (HOSPITAL_COMMUNITY): Payer: Self-pay

## 2022-11-19 MED ORDER — SEMAGLUTIDE-WEIGHT MANAGEMENT 2.4 MG/0.75ML ~~LOC~~ SOAJ
2.4000 mg | SUBCUTANEOUS | 1 refills | Status: DC
  Filled 2022-11-19 – 2022-12-03 (×4): qty 3, 28d supply, fill #0

## 2022-11-20 ENCOUNTER — Other Ambulatory Visit (HOSPITAL_COMMUNITY): Payer: Self-pay

## 2022-11-24 ENCOUNTER — Other Ambulatory Visit (HOSPITAL_COMMUNITY): Payer: Self-pay

## 2022-11-27 ENCOUNTER — Other Ambulatory Visit (HOSPITAL_COMMUNITY): Payer: Self-pay

## 2022-11-28 ENCOUNTER — Other Ambulatory Visit (HOSPITAL_COMMUNITY): Payer: Self-pay

## 2022-12-03 ENCOUNTER — Other Ambulatory Visit (HOSPITAL_COMMUNITY): Payer: Self-pay

## 2022-12-05 ENCOUNTER — Other Ambulatory Visit (HOSPITAL_COMMUNITY): Payer: Self-pay

## 2022-12-19 ENCOUNTER — Ambulatory Visit
Admission: EM | Admit: 2022-12-19 | Discharge: 2022-12-19 | Disposition: A | Discharge status: Home / Self Care | Payer: 59 | Attending: Internal Medicine | Admitting: Internal Medicine

## 2022-12-19 ENCOUNTER — Other Ambulatory Visit (HOSPITAL_COMMUNITY): Payer: Self-pay

## 2022-12-19 DIAGNOSIS — J039 Acute tonsillitis, unspecified: Secondary | ICD-10-CM | POA: Diagnosis not present

## 2022-12-19 DIAGNOSIS — Z7985 Long-term (current) use of injectable non-insulin antidiabetic drugs: Secondary | ICD-10-CM | POA: Diagnosis not present

## 2022-12-19 DIAGNOSIS — J449 Chronic obstructive pulmonary disease, unspecified: Secondary | ICD-10-CM | POA: Diagnosis not present

## 2022-12-19 DIAGNOSIS — G40909 Epilepsy, unspecified, not intractable, without status epilepticus: Secondary | ICD-10-CM | POA: Insufficient documentation

## 2022-12-19 DIAGNOSIS — G4733 Obstructive sleep apnea (adult) (pediatric): Secondary | ICD-10-CM | POA: Insufficient documentation

## 2022-12-19 DIAGNOSIS — Z1152 Encounter for screening for COVID-19: Secondary | ICD-10-CM | POA: Insufficient documentation

## 2022-12-19 DIAGNOSIS — R7303 Prediabetes: Secondary | ICD-10-CM | POA: Insufficient documentation

## 2022-12-19 DIAGNOSIS — I10 Essential (primary) hypertension: Secondary | ICD-10-CM | POA: Insufficient documentation

## 2022-12-19 DIAGNOSIS — Z79899 Other long term (current) drug therapy: Secondary | ICD-10-CM | POA: Diagnosis not present

## 2022-12-19 DIAGNOSIS — Z6841 Body Mass Index (BMI) 40.0 and over, adult: Secondary | ICD-10-CM | POA: Diagnosis not present

## 2022-12-19 DIAGNOSIS — B349 Viral infection, unspecified: Secondary | ICD-10-CM | POA: Diagnosis not present

## 2022-12-19 LAB — POCT RAPID STREP A (OFFICE): Rapid Strep A Screen: NEGATIVE

## 2022-12-19 MED ORDER — ACETAMINOPHEN 325 MG PO TABS
650.0000 mg | ORAL_TABLET | Freq: Once | ORAL | Status: AC
Start: 2022-12-19 — End: 2022-12-19
  Administered 2022-12-19: 650 mg via ORAL

## 2022-12-19 MED ORDER — OSELTAMIVIR PHOSPHATE 75 MG PO CAPS
75.0000 mg | ORAL_CAPSULE | Freq: Two times a day (BID) | ORAL | 0 refills | Status: AC
  Filled 2022-12-19: qty 10, 5d supply, fill #0

## 2022-12-19 NOTE — Discharge Instructions (Addendum)
Because of the rapid onset of your symptoms and the severity of your symptoms, believe it is important we treat you for presumed influenza.  I am sorry that we do not have any influenza test at this location.  To treat you, I sent a prescription for Tamiflu to your pharmacy, please take 1 tablet twice daily for the next 5 days.  Tamiflu works by reducing the amount of virus that your body helps you feel sooner and keeps you from feeling any sicker.  Your rapid strep test today is negative.  To confirm this, we will perform a throat culture which will take 3 to 5 days.  If that culture is positive, you will be contacted by phone and antibiotics will be prescribed for you.  If that culture is negative, then this confirms that your illness is most likely viral.  The results of your COVID-19 test will be made available to your MyChart tomorrow evening.  If there is a positive result, you will be contacted by phone with further recommendations.    Conservative care is also recommended with rest, continuing to drink hot tea, ibuprofen 400 to 600 mg every 6-8 hours, Tylenol 650 to 1000 mg every 6-8 hours, throat lozenges and rest.  If you are not feeling any better in the next 5 to 7 days, please let us know.  Thank you for visiting urgent care today.

## 2022-12-19 NOTE — ED Triage Notes (Signed)
Pt c/o body aches, headache, and sore throat.  Started: last night   Home interventions: hot tea

## 2022-12-19 NOTE — ED Provider Notes (Addendum)
[facility]    CSN: [id number] Arrival date & time: 12/19/22  1121    HISTORY  No chief complaint on file.  HPI Pam Mckee is a pleasant, 52 y.o. female who presents to urgent care today. Patient complains of body aches, headache and sore throat that began last night.  Patient states has been drinking hot tea with no relief of her symptoms.  The history is provided by the patient.   Past Medical History:  Diagnosis Date   Anemia    Elevated lipids    Hypertension    Olecranon bursitis of right elbow 02/15/2015   Suspect this was an acute gout flare triggered by HCTZ since she had no trauma    OSA on CPAP    Prediabetes    Primary osteoarthritis of left knee 05/18/2015   Nonweightbearing x-rays revealed mild degenerative changes.  Referred to [facility]  - saw Ellison Hughs , PA-C    Seizures [facility])    Thyroid disease    hypo   Torn LEFT medial meniscus 04/2015   Left knee MRI confirmed but no instability sxs. Seen at [place] 04/2016 to review treatment plan. sxs responded to cortisone injection summer 2016 and 04/2016 so continue prn. Consider arthroscopy if sxs become unresponsive.   Patient Active Problem List   Diagnosis Date Noted   Colon cancer screening 08/19/2021   Morbid obesity ([place]) 08/19/2021   Seizure disorder ([place]) 07/20/2020   Right knee pain 07/13/2020   Uterine leiomyoma 08/11/2019   Prediabetes    Elevated lipids    Nutritional counseling 04/26/2019   Simple obesity 02/19/2018   Family history of aneurysm 12/09/2015   Left hip pain 07/10/2015   Back pain with left-sided sciatica 07/10/2015   Hypothyroidism 06/06/2015   Nontoxic single thyroid nodule 06/06/2015   Obstructive sleep apnea 05/18/2015   Breast calcifications on mammogram 05/18/2015   Menorrhagia with regular cycle 05/18/2015   Chronic pain of left knee 04/23/2015   Severe obesity (BMI >= 40) ([place]) 03/15/2015   Thyroid activity decreased 03/15/2015   Vitamin D  deficiency 03/15/2015   Essential hypertension 02/15/2015   Past Surgical History:  Procedure Laterality Date   COLONOSCOPY WITH PROPOFOL N/A 04/05/2021   Procedure: COLONOSCOPY WITH PROPOFOL;  Surgeon: Carol Ada, MD;  Location: [facility];  Service: Endoscopy;  Laterality: N/A;   INTRAUTERINE DEVICE (IUD) INSERTION  06/2016   POLYPECTOMY  04/05/2021   Procedure: POLYPECTOMY;  Surgeon: Carol Ada, MD;  Location: [facility];  Service: Endoscopy;;   OB History     Gravida  2   Para  2   Term  1   Preterm  1   AB  0   Living  1      SAB  0   IAB  0   Ectopic  0   Multiple  0   Live Births             Home Medications    Prior to Admission medications   Medication Sig Start Date End Date Taking? Authorizing Provider  hydrochlorothiazide (HYDRODIURIL) 25 MG tablet TAKE 1 TABLET (25 MG TOTAL) BY MOUTH DAILY. 01/10/22     levonorgestrel (MIRENA) 20 MCG/24HR IUD 1 each by Intrauterine route once.    [provider]  levothyroxine (SYNTHROID) 112 MCG tablet Take 112 mcg by mouth daily before breakfast.    [provider]  losartan (COZAAR) 100 MG tablet TAKE 1 TABLET (100 MG TOTAL) BY MOUTH DAILY. 01/10/22  nystatin cream (MYCOSTATIN) Apply 1 Application topically 2 (two) times daily for up to 7 days. 09/08/22   Salvadore Dom, MD  rosuvastatin (CRESTOR) 10 MG tablet TAKE 1 TABLET BY MOUTH ONCE A DAY 04/09/21     rosuvastatin (CRESTOR) 10 MG tablet Take 1 tablet (10 mg total) by mouth daily. 05/19/22     Semaglutide, 2 MG/DOSE, (OZEMPIC, 2 MG/DOSE,) 8 MG/3ML SOPN Inject 2 mg as directed once a week. 06/05/21     Semaglutide, 2 MG/DOSE, (OZEMPIC, 2 MG/DOSE,) 8 MG/3ML SOPN Inject 2 mg into the skin once a week. 10/17/21     Semaglutide, 2 MG/DOSE, (OZEMPIC, 2 MG/DOSE,) 8 MG/3ML SOPN Inject 2 mg into the skin once a week. 04/23/22     Semaglutide-Weight Management 2.4 MG/0.75ML SOAJ Inject 2.4 mg into the skin once a week. Patient not taking:  Reported on 09/08/2022 09/05/22     Semaglutide-Weight Management 2.4 MG/0.75ML SOAJ Inject 2.4 mg into the skin once a week. 11/18/22     Vitamin D, Ergocalciferol, (DRISDOL) 1.25 MG (50000 UNIT) CAPS capsule Take 1 capsule (50,000 Units total) by mouth once a week. 10/17/21       Family History Family History  Problem Relation Age of Onset   Hypertension Mother    CVA Mother        occured while on lovenox   Hypertension Brother    Aneurysm Brother    Diabetes Brother    Diabetes Paternal Grandfather    Renal Disease Father    Breast cancer Neg Hx    Social History Social History   Tobacco Use   Smoking status: Never   Smokeless tobacco: Never  Vaping Use   Vaping Use: Never used  Substance Use Topics   Alcohol use: No    Alcohol/week: 0.0 standard drinks of alcohol   Drug use: No   Allergies   Patient has no known allergies.  Review of Systems Review of Systems Pertinent findings revealed after performing a 14 point review of systems has been noted in the history of present illness.  Physical Exam Triage Vital Signs ED Triage Vitals  Enc Vitals Group     BP 10/04/21 0827 (!) 147/82     Pulse Rate 10/04/21 0827 72     Resp 10/04/21 0827 18     Temp 10/04/21 0827 98.3 F (36.8 C)     Temp Source 10/04/21 0827 Oral     SpO2 10/04/21 0827 98 %     Weight --      Height --      Head Circumference --      Peak Flow --      Pain Score 10/04/21 0826 5     Pain Loc --      Pain Edu? --      Excl. in [place]? --   No data found.  Updated Vital Signs There were no vitals taken for this visit.  Physical Exam Vitals and nursing note reviewed.  Constitutional:      General: She is awake. She is not in acute distress.    Appearance: She is ill-appearing.  HENT:     Head: Normocephalic and atraumatic.     Salivary Glands: Right salivary gland is diffusely enlarged. Right salivary gland is not tender. Left salivary gland is diffusely enlarged. Left salivary gland is  not tender.     Right Ear: Hearing, tympanic membrane, ear canal and external ear normal.     Left Ear: Hearing, tympanic membrane,  ear canal and external ear normal.     Nose: Congestion and rhinorrhea present. Rhinorrhea is clear.     Right Sinus: No maxillary sinus tenderness or frontal sinus tenderness.     Left Sinus: No maxillary sinus tenderness.     Mouth/Throat:     Lips: Pink.     Mouth: Mucous membranes are moist.     Pharynx: Pharyngeal swelling, posterior oropharyngeal erythema and uvula swelling present. No oropharyngeal exudate.     Tonsils: Tonsillar exudate present. 2+ on the right. 2+ on the left.  Eyes:     General: Lids are normal.     Pupils: Pupils are equal, round, and reactive to light.  Cardiovascular:     Rate and Rhythm: Normal rate and regular rhythm.     Pulses: Normal pulses.     Heart sounds: Normal heart sounds, S1 normal and S2 normal.  Pulmonary:     Effort: Pulmonary effort is normal. No tachypnea, bradypnea, accessory muscle usage, prolonged expiration, respiratory distress or retractions.     Breath sounds: Normal breath sounds. No stridor, decreased air movement or transmitted upper airway sounds. No decreased breath sounds, wheezing, rhonchi or rales.     Comments: Turbulent breath sounds throughout without wheeze, rale, rhonchi. Abdominal:     General: Abdomen is flat. Bowel sounds are normal.     Palpations: Abdomen is soft.  Musculoskeletal:        General: Normal range of motion.     Cervical back: Full passive range of motion without pain, normal range of motion and neck supple.  Lymphadenopathy:     Cervical: Cervical adenopathy present.     Right cervical: Superficial cervical adenopathy present. No deep or posterior cervical adenopathy.    Left cervical: Superficial cervical adenopathy present. No deep or posterior cervical adenopathy.  Skin:    General: Skin is warm and dry.  Neurological:     General: No focal deficit present.      Mental Status: She is alert and oriented to person, place, and time.     Motor: Motor function is intact.     Coordination: Coordination is intact.     Gait: Gait is intact.     Deep Tendon Reflexes: Reflexes are normal and symmetric.  Psychiatric:        Attention and Perception: Attention and perception normal.        Mood and Affect: Mood and affect normal.        Speech: Speech normal.        Behavior: Behavior normal. Behavior is cooperative.        Thought Content: Thought content normal.     Visual Acuity Right Eye Distance:   Left Eye Distance:   Bilateral Distance:    Right Eye Near:   Left Eye Near:    Bilateral Near:     UC Couse / Diagnostics / Procedures:     Radiology No results found.  Procedures Procedures (including critical care time) EKG  Pending results:  Labs Reviewed - No data to display  Medications Ordered in UC: Medications - No data to display  UC Diagnoses / Final Clinical Impressions(s)   I have reviewed the triage vital signs and the nursing notes.  Pertinent labs & imaging results that were available during my care of the patient were reviewed by me and considered in my medical decision making (see chart for details).    Final diagnoses:  None   Rapid strep test is negative, throat  culture is pending, will treat with antibiotics as needed based on culture result.  Based on physical exam findings, will treat patient empirically for influenza with Tamiflu.  COVID-19 testing performed to rule this out.  Will notify patient of results once received.  Patient's COVID-19 vaccinations are not current.  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  Patient would benefit from Paxlovid if COVID-19 test is positive.  Last GFR was obtained in 2021 and is normal, patient does not appear to have any significant kidney dysfunction.  Do not believe that treatment with Paxlovid should be delayed in  the absence of recent GFR.  Conservative care recommended.  Return precautions advised.  Please see discharge instructions below for further details of plan of care as provided to patient. ED Prescriptions   None    PDMP not reviewed this encounter.  Disposition Upon Discharge:  Condition: stable for discharge home Home: take medications as prescribed; routine discharge instructions as discussed; follow up as advised.  Patient presented with an acute illness with associated systemic symptoms and significant discomfort requiring urgent management. In my opinion, this is a condition that a prudent lay person (someone who possesses an average knowledge of health and medicine) may potentially expect to result in complications if not addressed urgently such as respiratory distress, impairment of bodily function or dysfunction of bodily organs.   Routine symptom specific, illness specific and/or disease specific instructions were discussed with the patient and/or caregiver at length.   As such, the patient has been evaluated and assessed, work-up was performed and treatment was provided in alignment with urgent care protocols and evidence based medicine.  Patient/parent/caregiver has been advised that the patient may require follow up for further testing and treatment if the symptoms continue in spite of treatment, as clinically indicated and appropriate.  If the patient was tested for COVID-19, Influenza and/or RSV, then the patient/parent/guardian was advised to isolate at home pending the results of his/her diagnostic coronavirus test and potentially longer if they're positive. I have also advised pt that if his/her COVID-19 test returns positive, it's recommended to self-isolate for at least 10 days after symptoms first appeared AND until fever-free for 24 hours without fever reducer AND other symptoms have improved or resolved. Discussed self-isolation recommendations as well as instructions for  household member/close contacts as per the [facility] and [place] DHHS, and also gave patient the [place] packet with this information.  Patient/parent/caregiver has been advised to return to the [facility] or PCP in 3-5 days if no better; to PCP or the Emergency Department if new signs and symptoms develop, or if the current signs or symptoms continue to change or worsen for further workup, evaluation and treatment as clinically indicated and appropriate  The patient will follow up with their current PCP if and as advised. If the patient does not currently have a PCP we will assist them in obtaining one.   The patient may need specialty follow up if the symptoms continue, in spite of conservative treatment and management, for further workup, evaluation, consultation and treatment as clinically indicated and appropriate.  Patient/parent/caregiver verbalized understanding and agreement of plan as discussed.  All questions were addressed during visit.  Please see discharge instructions below for further details of plan.  Discharge Instructions: Discharge Instructions   None     This office note has been dictated using Dragon speech recognition software.  Unfortunately, this method of dictation can sometimes lead to typographical  or grammatical errors.  I apologize for your inconvenience in advance if this occurs.  Please do not hesitate to reach out to me if clarification is needed.      Lynden Oxford Scales, PA-C 12/19/22 1435    Lynden Oxford Scales, PA-C 12/19/22 1437    Lynden Oxford Scales, PA-C 12/19/22 1438

## 2022-12-20 LAB — SARS CORONAVIRUS 2 (TAT 6-24 HRS): SARS Coronavirus 2: NEGATIVE

## 2022-12-21 LAB — CULTURE, GROUP A STREP (THRC)

## 2022-12-22 ENCOUNTER — Other Ambulatory Visit (HOSPITAL_COMMUNITY): Payer: Self-pay

## 2022-12-22 ENCOUNTER — Telehealth: Payer: Self-pay | Admitting: Emergency Medicine

## 2022-12-22 MED ORDER — AMOXICILLIN 500 MG PO CAPS
500.0000 mg | ORAL_CAPSULE | Freq: Two times a day (BID) | ORAL | 0 refills | Status: AC
  Filled 2022-12-22: qty 20, 10d supply, fill #0

## 2022-12-22 NOTE — Telephone Encounter (Signed)
Throat culture positive for Streptococcus.  Amoxicillin sent to pharmacy.

## 2023-01-07 ENCOUNTER — Other Ambulatory Visit (HOSPITAL_COMMUNITY): Payer: Self-pay

## 2023-02-17 DIAGNOSIS — I10 Essential (primary) hypertension: Secondary | ICD-10-CM | POA: Diagnosis not present

## 2023-02-17 DIAGNOSIS — E1169 Type 2 diabetes mellitus with other specified complication: Secondary | ICD-10-CM | POA: Diagnosis not present

## 2023-02-17 DIAGNOSIS — E661 Drug-induced obesity: Secondary | ICD-10-CM | POA: Diagnosis not present

## 2023-02-18 ENCOUNTER — Other Ambulatory Visit (HOSPITAL_COMMUNITY): Payer: Self-pay

## 2023-02-18 MED ORDER — LEVOTHYROXINE SODIUM 112 MCG PO TABS
112.0000 ug | ORAL_TABLET | Freq: Every day | ORAL | 0 refills | Status: DC
  Filled 2023-02-18 – 2023-02-20 (×2): qty 90, 90d supply, fill #0

## 2023-02-20 ENCOUNTER — Other Ambulatory Visit (HOSPITAL_COMMUNITY): Payer: Self-pay

## 2023-02-20 ENCOUNTER — Other Ambulatory Visit: Payer: Self-pay

## 2023-02-21 ENCOUNTER — Other Ambulatory Visit (HOSPITAL_COMMUNITY): Payer: Self-pay

## 2023-02-23 ENCOUNTER — Other Ambulatory Visit (HOSPITAL_COMMUNITY): Payer: Self-pay

## 2023-02-23 ENCOUNTER — Other Ambulatory Visit: Payer: Self-pay

## 2023-02-23 MED ORDER — MOUNJARO 12.5 MG/0.5ML ~~LOC~~ SOAJ
SUBCUTANEOUS | 0 refills | Status: DC
  Filled 2023-02-23: qty 2, 28d supply, fill #0

## 2023-02-23 MED ORDER — MOUNJARO 15 MG/0.5ML ~~LOC~~ SOAJ
15.0000 mg | SUBCUTANEOUS | 1 refills | Status: DC
  Filled 2023-02-23 (×2): qty 2, 28d supply, fill #0
  Filled 2023-02-23 (×2): qty 6, 84d supply, fill #0
  Filled 2023-02-23 (×2): qty 2, 28d supply, fill #0

## 2023-02-24 ENCOUNTER — Other Ambulatory Visit: Payer: Self-pay

## 2023-02-25 ENCOUNTER — Other Ambulatory Visit: Payer: Self-pay

## 2023-02-25 MED ORDER — LOSARTAN POTASSIUM 100 MG PO TABS
100.0000 mg | ORAL_TABLET | Freq: Every day | ORAL | 0 refills | Status: DC
  Filled 2023-02-25: qty 90, 90d supply, fill #0

## 2023-02-25 MED ORDER — HYDROCHLOROTHIAZIDE 25 MG PO TABS
25.0000 mg | ORAL_TABLET | Freq: Every day | ORAL | 1 refills | Status: DC
  Filled 2023-02-25: qty 90, 90d supply, fill #0
  Filled 2023-05-19 (×3): qty 90, 90d supply, fill #1

## 2023-02-25 MED ORDER — ROSUVASTATIN CALCIUM 40 MG PO TABS
40.0000 mg | ORAL_TABLET | Freq: Every day | ORAL | 1 refills | Status: DC
  Filled 2023-02-25: qty 90, 90d supply, fill #0

## 2023-02-26 ENCOUNTER — Other Ambulatory Visit: Payer: Self-pay

## 2023-02-27 ENCOUNTER — Other Ambulatory Visit: Payer: Self-pay

## 2023-03-03 ENCOUNTER — Other Ambulatory Visit: Payer: Self-pay

## 2023-03-11 ENCOUNTER — Other Ambulatory Visit: Payer: Self-pay

## 2023-03-12 ENCOUNTER — Other Ambulatory Visit: Payer: Self-pay

## 2023-03-20 ENCOUNTER — Other Ambulatory Visit: Payer: Self-pay

## 2023-03-24 ENCOUNTER — Ambulatory Visit (INDEPENDENT_AMBULATORY_CARE_PROVIDER_SITE_OTHER): Payer: 59 | Admitting: Nurse Practitioner

## 2023-03-24 ENCOUNTER — Encounter: Payer: Self-pay | Admitting: Nurse Practitioner

## 2023-03-24 VITALS — BP 141/88 | HR 76 | Temp 98.0°F | Ht 66.0 in | Wt 292.0 lb

## 2023-03-24 DIAGNOSIS — E038 Other specified hypothyroidism: Secondary | ICD-10-CM | POA: Diagnosis not present

## 2023-03-24 DIAGNOSIS — Z6841 Body Mass Index (BMI) 40.0 and over, adult: Secondary | ICD-10-CM

## 2023-03-24 DIAGNOSIS — Z0289 Encounter for other administrative examinations: Secondary | ICD-10-CM

## 2023-03-24 DIAGNOSIS — I10 Essential (primary) hypertension: Secondary | ICD-10-CM | POA: Diagnosis not present

## 2023-03-24 DIAGNOSIS — E7849 Other hyperlipidemia: Secondary | ICD-10-CM

## 2023-03-24 NOTE — Progress Notes (Signed)
Office: [phone]  /  Fax: [phone]   Initial Visit  Pam Mckee was seen in clinic today to evaluate for obesity. She is interested in losing weight to improve overall health and reduce the risk of weight related complications. She presents today to review program treatment options, initial physical assessment, and evaluation.     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    When asked what else they would like to accomplish? She states: Improve existing medical conditions, Reduce number of medications, Improve quality of life, and Lose a target amount of weight : Goal weight 220 lbs  Weight history:  She has struggled with her weight "all my life".  Her weight has fluctuated over the years    When asked how has your weight affected you? She states: Contributed to medical problems, Contributed to orthopedic problems or mobility issues, Having fatigue, and Having poor endurance  Some associated conditions: HTN, OSAS, hypothyroidism, back pain, seizure disorder (52 yo), pre diabetes, Vit d def, right knee pain, hip pain   Contributing factors: Family history, Stress, Life event, and Pregnancy  Weight promoting medications identified: None  Current nutrition plan: None  Current level of physical activity: None  Current or previous pharmacotherapy: Ozempic  Response to medication: Lost weight initially but was unable to sustain weight loss   Past medical history includes:   Past Medical History:  Diagnosis Date   Anemia    Elevated lipids    Hypertension    Olecranon bursitis of right elbow 02/15/2015   Suspect this was an acute gout flare triggered by HCTZ since she had no trauma    OSA on CPAP    Prediabetes    Primary osteoarthritis of left knee 05/18/2015   Nonweightbearing x-rays revealed mild degenerative changes.  Referred to [facility]  - saw Lockie Pares , PA-C    Seizures    Thyroid disease    hypo    Torn LEFT medial meniscus 04/2015   Left knee MRI confirmed but no instability sxs. Seen at [facility] 04/2016 to review treatment plan. sxs responded to cortisone injection summer 2016 and 04/2016 so continue prn. Consider arthroscopy if sxs become unresponsive.     Objective:   BP (!) 141/88   Pulse 76   Temp 98 F (36.7 C)   Ht  (1.676 m)   Wt 292 lb (132.5 kg)   LMP  (LMP Unknown)   SpO2 96%   BMI 47.13 kg/m  She was weighed on the bioimpedance scale: Body mass index is 47.13 kg/m.  Peak Weight:298 lbs , Body Fat%:54, Visceral Fat Rating:19, Weight trend over the last 12 months: Increasing  General:  Alert, oriented and cooperative. Patient is in no acute distress.  Respiratory: Normal respiratory effort, no problems with respiration noted   Gait: able to ambulate independently  Mental Status: Normal mood and affect. Normal behavior. Normal judgment and thought content.   DIAGNOSTIC DATA REVIEWED:  BMET    Component Value Date/Time   NA 138 07/17/2020 0835   K 3.7 07/17/2020 0835   CL 100 07/17/2020 0835   CO2 24 07/17/2020 0835   GLUCOSE 99 07/17/2020 0835   GLUCOSE 86 09/09/2016 0837   BUN 20 07/17/2020 0835   CREATININE 0.76 07/17/2020 0835   CREATININE 0.84 09/09/2016 0837   CALCIUM 9.3 07/17/2020 0835   GFRNONAA 93 07/17/2020 0835   GFRNONAA >89 01/14/2016 1328   GFRAA  107 07/17/2020 0835   GFRAA >89 01/14/2016 1328   Lab Results  Component Value Date   HGBA1C 6.4 (H) 07/17/2020   HGBA1C 5.5 01/14/2016   No results found for: "INSULIN" CBC    Component Value Date/Time   WBC 9.0 03/08/2018 1158   WBC 11.2 (A) 11/24/2016 1311   WBC 7.6 11/19/2016 1337   RBC 4.63 03/08/2018 1158   RBC 5.22 11/24/2016 1311   RBC 4.47 11/19/2016 1337   HGB 11.7 03/08/2018 1158   HCT 37.4 03/08/2018 1158   PLT 353 03/08/2018 1158   MCV 81 03/08/2018 1158   MCH 25.3 (L) 03/08/2018 1158   MCH 25.2 (A) 11/24/2016 1311   MCH 24.4 (L) 11/19/2016 1337   MCHC 31.3  (L) 03/08/2018 1158   MCHC 34.0 11/24/2016 1311   MCHC 31.3 (L) 11/19/2016 1337   RDW 15.0 03/08/2018 1158   Iron/TIBC/Ferritin/ %Sat    Component Value Date/Time   IRON 30 (L) 07/12/2015 1826   TIBC 371 07/12/2015 1826   FERRITIN 82 03/08/2018 1158   IRONPCTSAT 8 (L) 07/12/2015 1826   Lipid Panel     Component Value Date/Time   CHOL 248 (H) 07/17/2020 0835   TRIG 86 07/17/2020 0835   HDL 47 07/17/2020 0835   CHOLHDL 5.3 (H) 07/17/2020 0835   CHOLHDL 4.3 09/09/2016 0825   VLDL 10 09/09/2016 0825   LDLCALC 186 (H) 07/17/2020 0835   Hepatic Function Panel     Component Value Date/Time   PROT 7.6 07/17/2020 0835   ALBUMIN 4.3 07/17/2020 0835   AST 17 07/17/2020 0835   ALT 19 07/17/2020 0835   ALKPHOS 101 07/17/2020 0835   BILITOT 0.3 07/17/2020 0835      Component Value Date/Time   TSH 1.283 10/25/2015 1715     Assessment and Plan:   Essential hypertension Continue to follow up with PCP.  Continue meds as directed  Other hyperlipidemia Continue to follow up with PCP.  Continue meds as directed  Other specified hypothyroidism Continue to follow up with PCP.  Continue meds as directed  Morbid obesity  BMI 45.0-49.9, adult        Obesity Treatment / Action Plan:  Patient will work on garnering support from family and friends to begin weight loss journey. Will work on eliminating or reducing the presence of highly palatable, calorie dense foods in the home. Will complete provided nutritional and psychosocial assessment questionnaire before the next appointment. Will be scheduled for indirect calorimetry to determine resting energy expenditure in a fasting state.  This will allow [place] to create a reduced calorie, high-protein meal plan to promote loss of fat mass while preserving muscle mass.  Obesity Education Performed Today:  She was weighed on the bioimpedance scale and results were discussed and documented in the synopsis.  We discussed obesity as a  disease and the importance of a more detailed evaluation of all the factors contributing to the disease.  We discussed the importance of long term lifestyle changes which include nutrition, exercise and behavioral modifications as well as the importance of customizing this to her specific health and social needs.  We discussed the benefits of reaching a healthier weight to alleviate the symptoms of existing conditions and reduce the risks of the biomechanical, metabolic and psychological effects of obesity.  Bobette Mo appears to be in the action stage of change and states they are ready to start intensive lifestyle modifications and behavioral modifications.  30 minutes was spent today on this  visit including the above counseling, pre-visit chart review, and post-visit documentation.  Reviewed by clinician on day of visit: allergies, medications, problem list, medical history, surgical history, family history, social history, and previous encounter notes pertinent to obesity diagnosis.    Theodis Sato Deloros Beretta FNP-C

## 2023-04-17 ENCOUNTER — Other Ambulatory Visit: Payer: Self-pay

## 2023-04-21 ENCOUNTER — Ambulatory Visit (INDEPENDENT_AMBULATORY_CARE_PROVIDER_SITE_OTHER): Payer: 59 | Admitting: Bariatrics

## 2023-04-21 ENCOUNTER — Encounter: Payer: Self-pay | Admitting: Bariatrics

## 2023-04-21 VITALS — BP 138/79 | HR 78 | Temp 97.8°F | Ht 66.0 in | Wt 290.0 lb

## 2023-04-21 DIAGNOSIS — Z1331 Encounter for screening for depression: Secondary | ICD-10-CM

## 2023-04-21 DIAGNOSIS — F32A Depression, unspecified: Secondary | ICD-10-CM | POA: Diagnosis not present

## 2023-04-21 DIAGNOSIS — R5383 Other fatigue: Secondary | ICD-10-CM

## 2023-04-21 DIAGNOSIS — Z6841 Body Mass Index (BMI) 40.0 and over, adult: Secondary | ICD-10-CM | POA: Insufficient documentation

## 2023-04-21 DIAGNOSIS — E559 Vitamin D deficiency, unspecified: Secondary | ICD-10-CM

## 2023-04-21 DIAGNOSIS — E038 Other specified hypothyroidism: Secondary | ICD-10-CM | POA: Diagnosis not present

## 2023-04-21 DIAGNOSIS — R7303 Prediabetes: Secondary | ICD-10-CM | POA: Diagnosis not present

## 2023-04-21 DIAGNOSIS — R0602 Shortness of breath: Secondary | ICD-10-CM | POA: Diagnosis not present

## 2023-04-21 DIAGNOSIS — E7849 Other hyperlipidemia: Secondary | ICD-10-CM | POA: Diagnosis not present

## 2023-04-21 DIAGNOSIS — G4733 Obstructive sleep apnea (adult) (pediatric): Secondary | ICD-10-CM

## 2023-04-21 DIAGNOSIS — Z0000 Encounter for general adult medical examination without abnormal findings: Secondary | ICD-10-CM

## 2023-04-21 DIAGNOSIS — I10 Essential (primary) hypertension: Secondary | ICD-10-CM

## 2023-04-22 ENCOUNTER — Encounter (INDEPENDENT_AMBULATORY_CARE_PROVIDER_SITE_OTHER): Payer: Self-pay | Admitting: Bariatrics

## 2023-04-22 LAB — COMPREHENSIVE METABOLIC PANEL
ALT: 15 IU/L (ref 0–32)
AST: 18 IU/L (ref 0–40)
Albumin/Globulin Ratio: 1.3 (ref 1.2–2.2)
Albumin: 4.2 g/dL (ref 3.8–4.9)
Alkaline Phosphatase: 109 IU/L (ref 44–121)
BUN/Creatinine Ratio: 20 (ref 9–23)
BUN: 15 mg/dL (ref 6–24)
Bilirubin Total: 0.3 mg/dL (ref 0.0–1.2)
CO2: 25 mmol/L (ref 20–29)
Calcium: 9.2 mg/dL (ref 8.7–10.2)
Chloride: 97 mmol/L (ref 96–106)
Creatinine, Ser: 0.76 mg/dL (ref 0.57–1.00)
Globulin, Total: 3.3 g/dL (ref 1.5–4.5)
Glucose: 107 mg/dL — ABNORMAL HIGH (ref 70–99)
Potassium: 4.2 mmol/L (ref 3.5–5.2)
Sodium: 136 mmol/L (ref 134–144)
Total Protein: 7.5 g/dL (ref 6.0–8.5)
eGFR: 95 mL/min/{1.73_m2} (ref >59)

## 2023-04-22 LAB — TSH+T4F+T3FREE
Free T4: 1.29 ng/dL (ref 0.82–1.77)
T3, Free: 3.1 pg/mL (ref 2.0–4.4)
TSH: 1.83 u[IU]/mL (ref 0.450–4.500)

## 2023-04-22 LAB — INSULIN, RANDOM: INSULIN: 74.3 u[IU]/mL — ABNORMAL HIGH (ref 2.6–24.9)

## 2023-04-22 LAB — VITAMIN B12: Vitamin B-12: 305 pg/mL (ref 232–1245)

## 2023-04-22 LAB — LIPID PANEL WITH LDL/HDL RATIO
Cholesterol, Total: 161 mg/dL (ref 100–199)
HDL: 41 mg/dL (ref >39)
LDL Chol Calc (NIH): 103 mg/dL — ABNORMAL HIGH (ref 0–99)
LDL/HDL Ratio: 2.5 ratio (ref 0.0–3.2)
Triglycerides: 92 mg/dL (ref 0–149)
VLDL Cholesterol Cal: 17 mg/dL (ref 5–40)

## 2023-04-22 LAB — HEMOGLOBIN A1C
Est. average glucose Bld gHb Est-mCnc: 143 mg/dL
Hgb A1c MFr Bld: 6.6 % — ABNORMAL HIGH (ref 4.8–5.6)

## 2023-04-22 LAB — VITAMIN D 25 HYDROXY (VIT D DEFICIENCY, FRACTURES): Vit D, 25-Hydroxy: 20.4 ng/mL — ABNORMAL LOW (ref 30.0–100.0)

## 2023-04-22 NOTE — Progress Notes (Unsigned)
Chief Complaint:   OBESITY Pam Mckee (MR# [id number]) is a 52 y.o. female who presents for evaluation and treatment of obesity and related comorbidities. Current BMI is Body mass index is 46.81 kg/m. Pam Mckee has been struggling with her weight for many years and has been unsuccessful in either losing weight, maintaining weight loss, or reaching her healthy weight goal.  Pam Mckee is currently in the action stage of change and ready to dedicate time achieving and maintaining a healthier weight. [name] is interested in becoming our patient and working on intensive lifestyle modifications including (but not limited to) diet and exercise for weight loss.  Pam Mckee met with Pam Mckee, nurse practitioner.  She notes that she does eat out a lot.  Pam Mckee's habits were reviewed today and are as follows: Her family eats meals together, she thinks her family will eat healthier with her, her desired weight loss is 70 lbs, she has been heavy most of her life, she started gaining weight in 6th grade, her heaviest weight ever was 298 pounds, she has significant food cravings issues, she snacks frequently in the evenings, she is frequently drinking liquids with calories, and she frequently makes poor food choices.  Depression Screen Pam Mckee's Food and Mood (modified PHQ-9) score was 15.  Subjective:   1. Other fatigue Pam Mckee admits to daytime somnolence and denies waking up still tired. Patient has a history of symptoms of daytime fatigue. Pam Mckee generally gets 6 or 8 hours of sleep per night, and states that she has generally restful sleep. Snoring is present. Apneic episodes are present. Epworth Sleepiness Score is 6.   2. SOB (shortness of breath) on exertion Pam Mckee notes increasing shortness of breath with exercising and seems to be worsening over time with weight gain. She notes getting out of breath sooner with activity than she used to. This has not gotten worse recently. Pam Mckee denies shortness of breath at  rest or orthopnea.  3. Essential hypertension [name]'s blood pressure is controlled.  She did not take her blood pressure medications today.  4. Other specified hypothyroidism Pam Mckee is taking Synthroid.  5. Health care maintenance Given obesity.  6. Pre-diabetes Pam Mckee is not on medications currently.  She was prescribed Mounjaro but never filled it.  7. Other hyperlipidemia Pam Mckee is taking Crestor.  8. OSA (obstructive sleep apnea) Pam Mckee notes restful sleep with her CPAP.  9. Vitamin D deficiency Pam Mckee is not on vitamin D supplementation currently.  Assessment/Plan:   1. Other fatigue Pam Mckee does feel that her weight is causing her energy to be lower than it should be. Fatigue may be related to obesity, depression or many other causes. Labs will be ordered, and in the meanwhile, Pam Mckee will focus on self care including making healthy food choices, increasing physical activity and focusing on stress reduction.  - EKG 12-Lead - TSH+T4F+T3Free  2. SOB (shortness of breath) on exertion Pam Mckee does feel that she gets out of breath more easily that she used to when she exercises. Pam Mckee's shortness of breath appears to be obesity related and exercise induced. She has agreed to work on weight loss and gradually increase exercise to treat her exercise induced shortness of breath. Will continue to monitor closely.  - TSH+T4F+T3Free  3. Essential hypertension Pam Mckee will continue her medications as directed.  4. Other specified hypothyroidism We will check labs today.  Pam Mckee will continue her medications as directed.  - TSH+T4F+T3Free  5. Health care maintenance We will check labs today. EKG and IC results  were reviewed with the patient today.  - Hemoglobin A1c - Insulin, random - Lipid Panel With LDL/HDL Ratio - TSH+T4F+T3Free - VITAMIN D 25 Hydroxy (Vit-D Deficiency, Fractures) - Comprehensive metabolic panel - Vitamin B12  6. Pre-diabetes We will check labs today, and we  will follow-up at Pam Mckee's next visit.  Metformin handout was provided.  - Hemoglobin A1c - Insulin, random  7. Other hyperlipidemia We will check labs today.  Pam Mckee will continue her medications as directed.  - Lipid Panel With LDL/HDL Ratio  8. OSA (obstructive sleep apnea) Pam Mckee will continue to use her CPAP nightly.  9. Vitamin D deficiency We will check labs today, and we will follow-up at Pam Mckee's next visit.  - VITAMIN D 25 Hydroxy (Vit-D Deficiency, Fractures)  10. Depression screening Pam Mckee had a positive depression screening. Depression is commonly associated with obesity and often results in emotional eating behaviors. We will monitor this closely and work on CBT to help improve the non-hunger eating patterns. Referral to Psychology may be required if no improvement is seen as she continues in our clinic.  11. Morbid obesity (HCC)  12. BMI 45.0-49.9, adult Pam Mckee) We will check labs today, and we will follow-up at [facility] next visit.  - Hemoglobin A1c - Insulin, random - Lipid Panel With LDL/HDL Ratio - TSH+T4F+T3Free - VITAMIN D 25 Hydroxy (Vit-D Deficiency, Fractures) - Comprehensive metabolic panel - Vitamin B12  Pam Mckee is currently in the action stage of change and her goal is to continue with weight loss efforts. Mckee recommend Pam Mckee begin the structured treatment plan as follows:  She has agreed to the Category 3 Plan.  Intentional eating and mindful eating were discussed.  Exercise goals: No exercise has been prescribed at this time.   Behavioral modification strategies: increasing lean protein intake, decreasing simple carbohydrates, increasing vegetables, increasing water intake, decreasing eating out, no skipping meals, meal planning and cooking strategies, keeping healthy foods in the home, and planning for success.  She was informed of the importance of frequent follow-up visits to maximize her success with intensive lifestyle modifications for her multiple  health conditions. She was informed we would discuss her lab results at her next visit unless there is a critical issue that needs to be addressed sooner. Pam Mckee agreed to keep her next visit at the agreed upon time to discuss these results.  Objective:   Blood pressure 138/79, pulse 78, temperature 97.8 F (36.6 C), height 5\' 6"  (1.676 m), weight 290 lb (131.5 kg), SpO2 97 %. Body mass index is 46.81 kg/m.  EKG: Normal sinus rhythm, rate 82 BPM.  Indirect Calorimeter completed today shows a VO2 of 270 and a REE of 1858.  Her calculated basal metabolic rate is 9604 thus her basal metabolic rate is worse than expected.  General: Cooperative, alert, well developed, in no acute distress. HEENT: Conjunctivae and lids unremarkable. Cardiovascular: Regular rhythm.  Lungs: Normal work of breathing. Neurologic: No focal deficits.   Lab Results  Component Value Date   CREATININE 0.76 04/21/2023   BUN 15 04/21/2023   NA 136 04/21/2023   K 4.2 04/21/2023   CL 97 04/21/2023   CO2 25 04/21/2023   Lab Results  Component Value Date   ALT 15 04/21/2023   AST 18 04/21/2023   ALKPHOS 109 04/21/2023   BILITOT 0.3 04/21/2023   Lab Results  Component Value Date   HGBA1C 6.6 (H) 04/21/2023   HGBA1C 6.4 (H) 07/17/2020   HGBA1C 5.9 (H) 06/23/2019   HGBA1C 5.7 (H)  03/08/2018   HGBA1C 5.4 05/21/2017   Lab Results  Component Value Date   INSULIN 74.3 (H) 04/21/2023   Lab Results  Component Value Date   TSH 1.830 04/21/2023   Lab Results  Component Value Date   CHOL 161 04/21/2023   HDL 41 04/21/2023   LDLCALC 103 (H) 04/21/2023   TRIG 92 04/21/2023   CHOLHDL 5.3 (H) 07/17/2020   Lab Results  Component Value Date   WBC 9.0 03/08/2018   HGB 11.7 03/08/2018   HCT 37.4 03/08/2018   MCV 81 03/08/2018   PLT 353 03/08/2018   Lab Results  Component Value Date   IRON 30 (L) 07/12/2015   TIBC 371 07/12/2015   FERRITIN 82 03/08/2018   Attestation Statements:   Reviewed by clinician  on day of visit: allergies, medications, problem list, medical history, surgical history, family history, social history, and previous encounter notes.   Trude Mcburney, am acting as Energy manager for [employer], DO.  Mckee have reviewed the above documentation for accuracy and completeness, and Mckee agree with the above. Corinna Capra, DO

## 2023-04-23 ENCOUNTER — Encounter: Payer: Self-pay | Admitting: Bariatrics

## 2023-05-05 ENCOUNTER — Other Ambulatory Visit: Payer: Self-pay

## 2023-05-05 ENCOUNTER — Encounter: Payer: Self-pay | Admitting: Nurse Practitioner

## 2023-05-05 ENCOUNTER — Ambulatory Visit: Payer: 59 | Admitting: Nurse Practitioner

## 2023-05-05 ENCOUNTER — Telehealth: Payer: Self-pay

## 2023-05-05 VITALS — BP 155/88 | HR 77 | Temp 98.1°F | Ht 66.0 in | Wt 294.0 lb

## 2023-05-05 DIAGNOSIS — E1169 Type 2 diabetes mellitus with other specified complication: Secondary | ICD-10-CM | POA: Diagnosis not present

## 2023-05-05 DIAGNOSIS — E785 Hyperlipidemia, unspecified: Secondary | ICD-10-CM

## 2023-05-05 DIAGNOSIS — R7989 Other specified abnormal findings of blood chemistry: Secondary | ICD-10-CM | POA: Diagnosis not present

## 2023-05-05 DIAGNOSIS — Z7985 Long-term (current) use of injectable non-insulin antidiabetic drugs: Secondary | ICD-10-CM | POA: Diagnosis not present

## 2023-05-05 DIAGNOSIS — E1159 Type 2 diabetes mellitus with other circulatory complications: Secondary | ICD-10-CM

## 2023-05-05 DIAGNOSIS — E559 Vitamin D deficiency, unspecified: Secondary | ICD-10-CM | POA: Diagnosis not present

## 2023-05-05 DIAGNOSIS — I152 Hypertension secondary to endocrine disorders: Secondary | ICD-10-CM | POA: Diagnosis not present

## 2023-05-05 DIAGNOSIS — Z6841 Body Mass Index (BMI) 40.0 and over, adult: Secondary | ICD-10-CM | POA: Diagnosis not present

## 2023-05-05 LAB — HM DIABETES EYE EXAM

## 2023-05-05 MED ORDER — TIRZEPATIDE 2.5 MG/0.5ML ~~LOC~~ SOAJ
2.5000 mg | SUBCUTANEOUS | 0 refills | Status: DC
  Filled 2023-05-05: qty 2, 28d supply, fill #0

## 2023-05-05 MED ORDER — VITAMIN D (ERGOCALCIFEROL) 1.25 MG (50000 UNIT) PO CAPS
50000.0000 [IU] | ORAL_CAPSULE | ORAL | 0 refills | Status: DC
  Filled 2023-05-05: qty 5, 35d supply, fill #0

## 2023-05-05 NOTE — Progress Notes (Signed)
Office: [phone]  /  Fax: [phone]  WEIGHT SUMMARY AND BIOMETRICS  No data recorded Weight Gained Since Last Visit: 4lb   Vitals Temp: 98.1 F (36.7 C) BP: (!) 155/88 Pulse Rate: 77 SpO2: 97 %   Anthropometric Measurements Height: 5\' 6"  (1.676 m) Weight: 294 lb (133.4 kg) BMI (Calculated): 47.48 Weight at Last Visit: 290lb Weight Gained Since Last Visit: 4lb Starting Weight: 290lb Total Weight Loss (lbs): 0 lb (0 kg)   Body Composition  Body Fat %: 52.4 % Fat Mass (lbs): 154.4 lbs Muscle Mass (lbs): 133.4 lbs Total Body Water (lbs): 97.6 lbs Visceral Fat Rating : 18   Other Clinical Data Fasting: Yes Labs: No Today's Visit #: 2 Starting Date: 04/21/23     HPI  Chief Complaint: OBESITY  Demisha is here to discuss her progress with her obesity treatment plan. She is on the the Category 3 Plan and states she is following her eating plan approximately 60-70 % of the time. She states she is exercising 20 minutes 7 days per week.   Interval History:  Since last office visit she has gained 4 pounds.  BF:  2 eggs with cheese with one piece of bread and with fair life chocolate milk, lunch: protein with vegetables and dinner: protein and vegetables and occ rice.  Snacks:  fat free jello, greek yogurt, berries. She is drinking water, fairlife milk, milk, cokes.  She is not tracking or measuring her foods.    Pharmacotherapy for weight loss: She is not currently taking medications  for medical weight loss.    Previous pharmacotherapy for medical weight loss:  Ozempic  Bariatric surgery:  Patient has not had bariatric surgery  Pharmacotherapy for DMT2:  She is not currently taking any medications.    Last A1c was 6.6 and fasting insulin was 74.3 She is not checking BS at home.   On ARB and statin.  Last eye exam:  appt scheduled today She has tried Ozempic in the past.  Denied side effects while taking Ozempic.    Lab Results  Component Value Date    HGBA1C 6.6 (H) 04/21/2023   HGBA1C 6.4 (H) 07/17/2020   HGBA1C 5.9 (H) 06/23/2019   Lab Results  Component Value Date   LDLCALC 103 (H) 04/21/2023   CREATININE 0.76 04/21/2023    Hypertension Hypertension BP is elevated today. Has appt scheduled in June for follow up with PCP.  Medication(s): HCTZ 25mg  and Cozaar 100mg .  Denies side effects   Denies chest pain, palpitations and SOB.  BP Readings from Last 3 Encounters:  05/05/23 (!) 155/88  04/21/23 138/79  03/24/23 (!) 141/88   Lab Results  Component Value Date   CREATININE 0.76 04/21/2023   CREATININE 0.76 07/17/2020   CREATININE 0.82 06/23/2019    Vit D deficiency  She is not taking Vit D.  She has taken Vit D in the past.  Denied side effects while taking Vit D.     Lab Results  Component Value Date   VD25OH 20.4 (L) 04/21/2023   VD25OH 18.5 (L) 07/17/2020   VD25OH 33.4 06/23/2019    Low Vit B12 Took Vit B12 injections in the past.  She is not currently taking Vit B12 or a MV.   Hyperlipidemia Medication(s): Crestor 10mg . Denies side effects.    Lab Results  Component Value Date   CHOL 161 04/21/2023   HDL 41 04/21/2023   LDLCALC 103 (H) 04/21/2023   TRIG 92 04/21/2023   CHOLHDL 5.3 (H)  07/17/2020   Lab Results  Component Value Date   ALT 15 04/21/2023   AST 18 04/21/2023   ALKPHOS 109 04/21/2023   BILITOT 0.3 04/21/2023   The 10-year ASCVD risk score (Arnett DK, et al., 2019) is: 20.9%   Values used to calculate the score:     Age: 52 years     Sex: Female     Is Non-Hispanic African American: Yes     Diabetic: Yes     Tobacco smoker: No     Systolic Blood Pressure: 155 mmHg     Is BP treated: Yes     HDL Cholesterol: 41 mg/dL     Total Cholesterol: 161 mg/dL   PHYSICAL EXAM:  Blood pressure (!) 155/88, pulse 77, temperature 98.1 F (36.7 C), height 5\' 6"  (1.676 m), weight 294 lb (133.4 kg), SpO2 97 %. Body mass index is 47.45 kg/m.  General: She is overweight, cooperative, alert, well  developed, and in no acute distress. PSYCH: Has normal mood, affect and thought process.   Extremities: No edema.  Neurologic: No gross sensory or motor deficits. No tremors or fasciculations noted.    DIAGNOSTIC DATA REVIEWED:  BMET    Component Value Date/Time   NA 136 04/21/2023 0902   K 4.2 04/21/2023 0902   CL 97 04/21/2023 0902   CO2 25 04/21/2023 0902   GLUCOSE 107 (H) 04/21/2023 0902   GLUCOSE 86 09/09/2016 0837   BUN 15 04/21/2023 0902   CREATININE 0.76 04/21/2023 0902   CREATININE 0.84 09/09/2016 0837   CALCIUM 9.2 04/21/2023 0902   GFRNONAA 93 07/17/2020 0835   GFRNONAA >89 01/14/2016 1328   GFRAA 107 07/17/2020 0835   GFRAA >89 01/14/2016 1328   Lab Results  Component Value Date   HGBA1C 6.6 (H) 04/21/2023   HGBA1C 5.5 01/14/2016   Lab Results  Component Value Date   INSULIN 74.3 (H) 04/21/2023   Lab Results  Component Value Date   TSH 1.830 04/21/2023   CBC    Component Value Date/Time   WBC 9.0 03/08/2018 1158   WBC 11.2 (A) 11/24/2016 1311   WBC 7.6 11/19/2016 1337   RBC 4.63 03/08/2018 1158   RBC 5.22 11/24/2016 1311   RBC 4.47 11/19/2016 1337   HGB 11.7 03/08/2018 1158   HCT 37.4 03/08/2018 1158   PLT 353 03/08/2018 1158   MCV 81 03/08/2018 1158   MCH 25.3 (L) 03/08/2018 1158   MCH 25.2 (A) 11/24/2016 1311   MCH 24.4 (L) 11/19/2016 1337   MCHC 31.3 (L) 03/08/2018 1158   MCHC 34.0 11/24/2016 1311   MCHC 31.3 (L) 11/19/2016 1337   RDW 15.0 03/08/2018 1158   Iron Studies    Component Value Date/Time   IRON 30 (L) 07/12/2015 1826   TIBC 371 07/12/2015 1826   FERRITIN 82 03/08/2018 1158   IRONPCTSAT 8 (L) 07/12/2015 1826   Lipid Panel     Component Value Date/Time   CHOL 161 04/21/2023 0902   TRIG 92 04/21/2023 0902   HDL 41 04/21/2023 0902   CHOLHDL 5.3 (H) 07/17/2020 0835   CHOLHDL 4.3 09/09/2016 0825   VLDL 10 09/09/2016 0825   LDLCALC 103 (H) 04/21/2023 0902   Hepatic Function Panel     Component Value Date/Time   PROT  7.5 04/21/2023 0902   ALBUMIN 4.2 04/21/2023 0902   AST 18 04/21/2023 0902   ALT 15 04/21/2023 0902   ALKPHOS 109 04/21/2023 0902   BILITOT 0.3 04/21/2023 0902  Component Value Date/Time   TSH 1.830 04/21/2023 0902   Nutritional Lab Results  Component Value Date   VD25OH 20.4 (L) 04/21/2023   VD25OH 18.5 (L) 07/17/2020   VD25OH 33.4 06/23/2019     ASSESSMENT AND PLAN  TREATMENT PLAN FOR OBESITY:  Recommended Dietary Goals  Tiaa is currently in the action stage of change. As such, her goal is to continue weight management plan. She has agreed to track her calories/protein/carbs.  Behavioral Intervention  We discussed the following Behavioral Modification Strategies today: increasing lean protein intake, decreasing simple carbohydrates , increasing vegetables, increasing lower glycemic fruits, increasing fiber rich foods, avoiding skipping meals, increasing water intake, continue to practice mindfulness when eating, and planning for success.  Additional resources provided today: NA  Recommended Physical Activity Goals  Raelyn has been advised to work up to 150 minutes of moderate intensity aerobic activity a week and strengthening exercises 2-3 times per week for cardiovascular health, weight loss maintenance and preservation of muscle mass.   She has agreed to Think about ways to increase daily physical activity and overcoming barriers to exercise and Increase physical activity in their day and reduce sedentary time (increase NEAT).   Pharmacotherapy We discussed various medication options to help [place] with her weight loss efforts and we both agreed to start [facility] for DMT2.  ASSOCIATED CONDITIONS ADDRESSED TODAY  Action/Plan  Vitamin D deficiency -     Start Vitamin D (Ergocalciferol); Take 1 capsule (50,000 Units total) by mouth once a week.  Dispense: 5 capsule; Refill: 0.  Side effects discussed  Low Vitamin D level contributes to fatigue and are  associated with obesity, breast, and colon cancer. She agrees to continue to take prescription Vitamin D @50 ,000 IU every week and will follow-up for routine testing of Vitamin D, at least 2-3 times per year to avoid over-replacement.   Type 2 diabetes mellitus with other specified complication, without long-term current use of insulin (HCC) -     Start Tirzepatide; Inject 2.5 mg into the skin once a week.  Dispense: 2 mL; Refill: 0.  Side effects disucssed  New diagnosis.  All questions answered   Good blood sugar control is important to decrease the likelihood of diabetic complications such as nephropathy, neuropathy, limb loss, blindness, coronary artery disease, and death. Intensive lifestyle modification including diet, exercise and weight loss are the first line of treatment for diabetes.    Hypertension associated with type 2 diabetes mellitus (HCC) To follow up with PCP to discuss BP.  Continue meds as PCP directes    Low vitamin B12 level To start Vit B12 OTC  Hyperlipidemia associated with type 2 diabetes mellitus (HCC) Continue following up with PCP.  Continue meds as directed.  ASCVD reviewed with patient today.  To discuss with PCP.    Morbid obesity (HCC)  BMI 45.0-49.9, adult (HCC)      Labs reviewed in chart from 04/21/23  I spent 40 minutes with the patient and reviewing her chart before and after her visit.     Return in about 3 weeks (around 05/26/2023).Marland Kitchen She was informed of the importance of frequent follow up visits to maximize her success with intensive lifestyle modifications for her multiple health conditions.   ATTESTASTION STATEMENTS:  Reviewed by clinician on day of visit: allergies, medications, problem list, medical history, surgical history, family history, social history, and previous encounter notes.     Theodis Sato. Mycal Conde FNP-C

## 2023-05-05 NOTE — Patient Instructions (Signed)

## 2023-05-05 NOTE — Telephone Encounter (Signed)
PA submitted through Cover My Meds for [facility]. Awaiting insurance determination. Key: Rise Paganini

## 2023-05-06 ENCOUNTER — Encounter: Payer: 59 | Attending: Family Medicine | Admitting: Registered"

## 2023-05-06 ENCOUNTER — Encounter: Payer: Self-pay | Admitting: Registered"

## 2023-05-06 DIAGNOSIS — E119 Type 2 diabetes mellitus without complications: Secondary | ICD-10-CM | POA: Insufficient documentation

## 2023-05-06 NOTE — Patient Instructions (Addendum)
Consider taking OTC vitamin D3 with K take with a meal. 10 min a day of sunshine by walking dog after work.  Avoid skipping meals Snack 10 am & maybe afternoon snack if hungry. If not hungry think about what you ate for lunch. Snack ideas: Laughing Cow (low-fat) and sweet bell peppers, Greek Yogurt, carrots with dip such as peanut butter.  Get curious about why you are craving snacks.

## 2023-05-06 NOTE — Progress Notes (Unsigned)
Employee visit 1 of 3  Medical Nutrition Therapy  Appointment Start time:  [phone]  Appointment End time:  1615  Primary concerns today: Has new Dx of T2D  Referral diagnosis: N/A Preferred learning style: no preference indicated Learning readiness: contemplating, ready, change in progress  NUTRITION ASSESSMENT  Anthropometrics  Wt Readings from Last 3 Encounters:  05/05/23 294 lb (133.4 kg)  04/21/23 290 lb (131.5 kg)  03/24/23 292 lb (132.5 kg)   Clinical Medical Hx: reviewed Medications: reviewed Mounjaro, Vit D 50,000 weekly Labs:  Lab Results  Component Value Date   HGBA1C 6.6 (H) 04/21/2023  Notable Signs/Symptoms: weight gain  Lifestyle & Dietary Hx Pt reports recently was diagnosed with diabetes and watching the amount of carbs she is eating.  Pt states she always wants to eat, not full. Pt states when at the office she thinks a lot about snacking and getting snacks out of the vending machine.   Pt states she loves spinach and eats vegetables daily. Pt states broccoli cheddar is her favorite soup.  Last year was on Ozempic lost desire to eat and was up to 1 mg and couldn't get it for a few months and started 2 mg was hungry for snacks and felt like it was working. Stopped June of '23 d/t insurance coverage. Prior Auth when through for [facility] 6 months later but didn't start due to $600 co-pay.  Pt states she has some life changes as well in the last year; got married 08/29/22, got a yorkie puppy, and moved to new location. Pt states she was 250 lbs and since then has gained 40-ish pounds.   Pt reports they had been eating out 7 days a week, they have decided to eat at home. Starting last night they have made changed and plan to start eating   Pt states she recently started the program at [facility] and was given a goal of 90 grams protein daily. Pt states her breakfast meal plan calls for 3 eggs (or 2 eggs and 45 cal cheese) and 8 oz Fairlife fat free milk,  [place] sausage, delightful Terrill Mohr wheat bread. Pt states she doesn't like the sausage.  Pt states she is trying to give up soda. Pt states when she goes out to a restaurant orders a Coke. Pt states she plans to cut back eating out to 2x/week.  Estimated daily fluid intake: 50+ oz water Supplements: not assessed Sleep: not assessed Stress / self-care: not assessed Current average weekly physical activity: not assessed  24-Hr Dietary Recall  First Meal: 2 eggs, 45 cal cheese and 8 oz Fairlife fat-free milk, delightful Terrill Mohr wheat bread Snack: Greek yogurt Second Meal: chicken, green beans Snack:  Third Meal:  Snack:  Beverages: water, coke   Estimated Energy Needs Calories: not calculated  NUTRITION DIAGNOSIS  NB-1.1 Food and nutrition-related knowledge deficit As related to macros and relationship calories.  As evidenced by understanding reasoning for goals from [facility] plan.  NUTRITION INTERVENTION  Nutrition education (E-1) on the following topics:  Reading Nutrition Facts label ***  Handouts Provided Include  None  Learning Style & Readiness for Change Teaching method utilized: Visual & Auditory  Demonstrated degree of understanding via: Teach Back  Barriers to learning/adherence to lifestyle change: ***  Goals Established by Pt ***   MONITORING & EVALUATION Dietary intake, weekly physical activity, and *** in ***.

## 2023-05-07 NOTE — Telephone Encounter (Signed)
Appeal sent through Cover My Meds

## 2023-05-07 NOTE — Telephone Encounter (Signed)
Received fax from [employer] that Pam Mckee has been denied. "Based on the information sent for review, the requested drug did not meet our guideline rules. Patient does not have diabetes mellitus.:

## 2023-05-11 ENCOUNTER — Encounter: Payer: Self-pay | Admitting: Nurse Practitioner

## 2023-05-14 ENCOUNTER — Other Ambulatory Visit: Payer: Self-pay

## 2023-05-14 MED ORDER — ROSUVASTATIN CALCIUM 10 MG PO TABS
10.0000 mg | ORAL_TABLET | Freq: Every day | ORAL | 0 refills | Status: DC
  Filled 2023-05-14: qty 90, 90d supply, fill #0

## 2023-05-15 ENCOUNTER — Other Ambulatory Visit: Payer: Self-pay

## 2023-05-18 ENCOUNTER — Other Ambulatory Visit (HOSPITAL_COMMUNITY): Payer: Self-pay

## 2023-05-18 ENCOUNTER — Other Ambulatory Visit: Payer: Self-pay

## 2023-05-18 NOTE — Telephone Encounter (Signed)
Received fax from [employer] that the appeal for Pam Mckee has been approved. Approval dates: 05/18/23-05/16/24.

## 2023-05-19 ENCOUNTER — Other Ambulatory Visit: Payer: Self-pay

## 2023-05-20 ENCOUNTER — Ambulatory Visit: Payer: Commercial Managed Care - PPO | Admitting: Obstetrics and Gynecology

## 2023-05-22 ENCOUNTER — Encounter: Payer: Self-pay | Admitting: Pharmacist

## 2023-05-22 ENCOUNTER — Other Ambulatory Visit: Payer: Self-pay

## 2023-05-27 ENCOUNTER — Other Ambulatory Visit: Payer: Self-pay

## 2023-05-27 ENCOUNTER — Ambulatory Visit: Payer: 59 | Admitting: Bariatrics

## 2023-05-27 ENCOUNTER — Encounter: Payer: Self-pay | Admitting: Bariatrics

## 2023-05-27 DIAGNOSIS — Z7985 Long-term (current) use of injectable non-insulin antidiabetic drugs: Secondary | ICD-10-CM | POA: Diagnosis not present

## 2023-05-27 DIAGNOSIS — Z6841 Body Mass Index (BMI) 40.0 and over, adult: Secondary | ICD-10-CM | POA: Diagnosis not present

## 2023-05-27 DIAGNOSIS — E559 Vitamin D deficiency, unspecified: Secondary | ICD-10-CM | POA: Diagnosis not present

## 2023-05-27 DIAGNOSIS — E1169 Type 2 diabetes mellitus with other specified complication: Secondary | ICD-10-CM

## 2023-05-27 MED ORDER — VITAMIN D (ERGOCALCIFEROL) 1.25 MG (50000 UNIT) PO CAPS
50000.0000 [IU] | ORAL_CAPSULE | ORAL | 0 refills | Status: DC
  Filled 2023-05-27 – 2023-06-02 (×2): qty 5, 35d supply, fill #0

## 2023-05-27 MED ORDER — TIRZEPATIDE 5 MG/0.5ML ~~LOC~~ SOAJ
5.0000 mg | SUBCUTANEOUS | 0 refills | Status: DC
  Filled 2023-05-27 – 2023-05-28 (×3): qty 2, 28d supply, fill #0

## 2023-05-27 NOTE — Progress Notes (Signed)
WEIGHT SUMMARY AND BIOMETRICS  Weight Lost Since Last Visit: 1lb   Vitals Temp: 97.8 F (36.6 C) BP: 138/85 Pulse Rate: 82 SpO2: 99 %   Anthropometric Measurements Height: 5\' 6"  (1.676 m) Weight: 293 lb (132.9 kg) BMI (Calculated): 47.31 Weight at Last Visit: 294lb Weight Lost Since Last Visit: 1lb Starting Weight: 290lb Total Weight Loss (lbs): 0 lb (0 kg)   Body Composition  Body Fat %: 52.6 % Fat Mass (lbs): 154.2 lbs Muscle Mass (lbs): 131.8 lbs Total Body Water (lbs): 101 lbs Visceral Fat Rating : 18   Other Clinical Data Fasting: no Labs: no Today's Visit #: 3 Starting Date: 04/21/23    OBESITY Pam Mckee is here to discuss her progress with her obesity treatment plan along with follow-up of her obesity related diagnoses.     Nutrition Plan: the Category 3 plan - 70% adherence.  Current exercise: walking  Interim History:  She is down 1 lb since her last visit.  Eating all of the food on the plan., Protein intake is as prescribed, and Water intake is adequate.  Pharmacotherapy: [name] is on Mounjaro 2.5 mg SQ weekly Adverse side effects: None Hunger is moderately controlled.  Cravings are moderately controlled.  Assessment/Plan:   1. Vitamin D deficiency  Vitamin D is not at goal of 50.  Most recent vitamin D level was 20.4. She is on  prescription ergocalciferol 50,000 IU weekly. Lab Results  Component Value Date   VD25OH 20.4 (L) 04/21/2023   VD25OH 18.5 (L) 07/17/2020   VD25OH 33.4 06/23/2019    Plan: Refill prescription vitamin D 50,000 IU weekly.   2. Type 2 diabetes mellitus with other specified complication, without long-term current use of insulin (HCC)  HgbA1c is at goal. Last A1c was 6.6 CBGs: Not checking      Episodes of hypoglycemia: no Medication(s): Mounjaro 5.0 mg SQ weekly  Lab Results  Component Value Date   HGBA1C 6.6 (H) 04/21/2023   HGBA1C 6.4 (H) 07/17/2020   HGBA1C 5.9 (H) 06/23/2019   Lab  Results  Component Value Date   LDLCALC 103 (H) 04/21/2023   CREATININE 0.76 04/21/2023   No results found for: "GFR"  Plan: Continue and increase dose Mounjaro 5.0 mg SQ weekly Continue all other medications.  Will keep all carbohydrates low both sweets and starches.  Will continue exercise regimen to 30 to 60 minutes on most days of the week.  Aim for 7 to 9 hours of sleep nightly.  Eat more low glycemic index foods.     Morbid Obesity: Current BMI BMI (Calculated): 47.31   Pharmacotherapy Plan Continue and increase dose  Mounjaro 5.0 mg SQ weekly  [name] is currently in the action stage of change. As such, her goal is to continue with weight loss efforts.  She has agreed to the Category 3 plan.  Exercise goals: All adults should avoid inactivity. Some physical activity is better than none, and adults who participate in any amount of physical activity gain some health benefits.  Behavioral modification strategies: increasing lean protein intake, planning for success, and increasing fiber rich foods.  [name] has agreed to follow-up with our clinic in 4 weeks.   Objective:   VITALS: Per patient if applicable, see vitals. GENERAL: Alert and in no acute distress. CARDIOPULMONARY: No increased WOB. Speaking in clear sentences.  PSYCH: Pleasant and cooperative. Speech normal rate and rhythm. Affect is appropriate. Insight and judgement are appropriate. Attention is focused, linear, and appropriate.  NEURO: Oriented as  arrived to appointment on time with no prompting.   Attestation Statements:    This was prepared with the assistance of Engineer, civil (consulting).  Occasional wrong-word or sound-a-like substitutions may have occurred due to the inherent limitations of voice recognition software.   Corinna Capra, DO

## 2023-05-28 ENCOUNTER — Other Ambulatory Visit: Payer: Self-pay

## 2023-05-28 ENCOUNTER — Other Ambulatory Visit (HOSPITAL_COMMUNITY): Payer: Self-pay

## 2023-05-28 ENCOUNTER — Encounter: Payer: Self-pay | Admitting: Nurse Practitioner

## 2023-05-29 ENCOUNTER — Other Ambulatory Visit: Payer: Self-pay

## 2023-06-02 ENCOUNTER — Other Ambulatory Visit: Payer: Self-pay

## 2023-06-02 NOTE — Progress Notes (Unsigned)
New Patient Office Visit  Subjective    Patient ID: Pam Mckee, female    DOB: Nov 12, 1971  Age: 52 y.o. MRN: [id number]  HPI Pam Mckee is a 52 year old female who presents to establish care.  Former PCP: Dr. Norberto Sorenson and then also saw Dr. Parke Simmers at the [facility]  She has a history of HTN, OSA, hypothyroidism, back/hip/R knee pain, type 2 DM, vitamin D deficiency and seizure disorder (at 52 yo).   [facility]- started seeing 03/24/2023 DM2- 04/21/2023- A1c 6.6, taking Mounjaro 5mg  weekly   OSA on CPAP- "needs to get better using the machine"  Reports she has not used this in years   HTN- hydrochlorothiazide 25mg  daily & losartan 100mg  daily  04/21/23: CMP normal K, kidney function, liver function  Has a lot of lower extremity swelling- compression socks are uncomfortable  Sedentary occupation, sits at desk for work. Does try to do walking throughout the day- walks a couple of times from her office building to the hospital  Currently following low sodium diet & following regimen [facility] has discussed with her. She reports she is drinking plenty of water- stopped drinking soda about 2 weeks ago.   HLD- lipid panel 04/21/23 with elevated LDL at 103- taking crestor 10mg  daily   The 10-year ASCVD risk score (Arnett DK, et al., 2019) is: 20%   Values used to calculate the score:     Age: 52 years     Sex: Female     Is Non-Hispanic African American: Yes     Diabetic: Yes     Tobacco smoker: No     Systolic Blood Pressure: 153 mmHg     Is BP treated: Yes     HDL Cholesterol: 41 mg/dL     Total Cholesterol: 161 mg/dL  Vitamin D deficiency- followed by [facility]  Taking 50,000IU for 5 weeks  Hypothyroid- Synthroid - normal thyroid function  No s/s hypo- or hyperthyroidism   Outpatient Encounter Medications as of 06/03/2023  Medication Sig   levonorgestrel (MIRENA) 20 MCG/24HR IUD 1 each by Intrauterine route once.    tirzepatide [facility]) 5 MG/0.5ML Pen Inject 5 mg into the skin once a week.   Vitamin D, Ergocalciferol, (DRISDOL) 1.25 MG (50000 UNIT) CAPS capsule Take 1 capsule (50,000 Units total) by mouth once a week.   [DISCONTINUED] hydrochlorothiazide (HYDRODIURIL) 25 MG tablet TAKE 1 TABLET (25 MG TOTAL) BY MOUTH DAILY.   [DISCONTINUED] levothyroxine (SYNTHROID) 112 MCG tablet Take 1 tablet (112 mcg total) by mouth daily on an empty stomach. No food or drink for 30 mins after taking medication.   [DISCONTINUED] losartan (COZAAR) 100 MG tablet Take 1 tablet (100 mg total) by mouth daily.   [DISCONTINUED] nystatin cream (MYCOSTATIN) Apply 1 Application topically 2 (two) times daily for up to 7 days.   [DISCONTINUED] rosuvastatin (CRESTOR) 10 MG tablet Take 1 tablet (10 mg total) by mouth daily.   hydrochlorothiazide (HYDRODIURIL) 50 MG tablet Take 1 tablet (50 mg total) by mouth daily.   levothyroxine (SYNTHROID) 112 MCG tablet Take 1 tablet (112 mcg total) by mouth daily on an empty stomach. No food or drink for 30 mins after taking medication.   losartan (COZAAR) 100 MG tablet Take 1 tablet (100 mg total) by mouth daily.   nystatin cream (MYCOSTATIN) Apply 1 Application topically 2 (two) times daily for up to 7 days.   rosuvastatin (CRESTOR) 10 MG tablet Take  1 tablet (10 mg total) by mouth daily.   [DISCONTINUED] hydrochlorothiazide (HYDRODIURIL) 25 MG tablet Take 1 tablet (25 mg total) by mouth daily. (Patient not taking: Reported on 06/03/2023)   [DISCONTINUED] levothyroxine (SYNTHROID) 112 MCG tablet Take 112 mcg by mouth daily before breakfast. (Patient not taking: Reported on 06/03/2023)   [DISCONTINUED] losartan (COZAAR) 100 MG tablet TAKE 1 TABLET (100 MG TOTAL) BY MOUTH DAILY. (Patient not taking: Reported on 06/03/2023)   [DISCONTINUED] rosuvastatin (CRESTOR) 10 MG tablet TAKE 1 TABLET BY MOUTH ONCE A DAY (Patient not taking: Reported on 06/03/2023)   No facility-administered encounter medications  on file as of 06/03/2023.   Past Medical History:  Diagnosis Date   Anemia    Back pain    Elevated lipids    High cholesterol    Hypertension    Olecranon bursitis of right elbow 02/15/2015   Suspect this was an acute gout flare triggered by HCTZ since she had no trauma    OSA on CPAP    Prediabetes    Primary osteoarthritis of left knee 05/18/2015   Nonweightbearing x-rays revealed mild degenerative changes.  Referred to [facility]  - saw Lockie Pares , PA-C    Seizures [facility])    Thyroid disease    hypo   Torn LEFT medial meniscus 04/2015   Left knee MRI confirmed but no instability sxs. Seen at [facility] 04/2016 to review treatment plan. sxs responded to cortisone injection summer 2016 and 04/2016 so continue prn. Consider arthroscopy if sxs become unresponsive.    Review of Systems  Constitutional:  Negative for malaise/fatigue.  Eyes:  Negative for blurred vision and double vision.  Respiratory:  Positive for shortness of breath (some with moderate exertion). Negative for cough.   Cardiovascular:  Positive for leg swelling. Negative for chest pain, palpitations, orthopnea, claudication and PND.  Gastrointestinal:  Negative for abdominal pain, nausea and vomiting.  Musculoskeletal:  Negative for myalgias.  Neurological:  Negative for dizziness, weakness and headaches.  Psychiatric/Behavioral:  Negative for depression and suicidal ideas. The patient is not nervous/anxious and does not have insomnia.       Objective    BP (!) 153/77   Pulse 71   Ht 5\' 6"  (1.676 m)   Wt 294 lb (133.4 kg)   LMP  (LMP Unknown)   SpO2 98%   BMI 47.45 kg/m   Physical Exam Constitutional:      Appearance: Normal appearance.  Cardiovascular:     Rate and Rhythm: Normal rate and regular rhythm.     Pulses: Normal pulses.          Radial pulses are 2+ on the right side and 2+ on the left side.     Heart sounds: Normal heart sounds. No murmur heard. Pulmonary:     Effort: Pulmonary  effort is normal.     Breath sounds: Normal breath sounds.  Musculoskeletal:     Right lower leg: 1+ Pitting Edema present.     Left lower leg: 1+ Pitting Edema present.  Neurological:     Mental Status: She is alert.  Psychiatric:        Mood and Affect: Mood normal.        Behavior: Behavior normal.        Thought Content: Thought content normal.        Judgment: Judgment normal.       Assessment & Plan:   1. Type 2 diabetes mellitus without complication, without long-term current use of insulin (  HCC) Hemoglobin A1c 6.6 on 04/21/2023. Patient is currently on Mounjaro 5mg  weekly, in addition to focusing on healthy food options and daily walking. Plan to obtain urine microalbumin/creatinine ratio next visit and complete foot exam.   2. Hypertension associated with type 2 diabetes mellitus (HCC) Patient presents today with elevated blood pressure. Patient in no acute distress and is well-appearing. Denies chest pain, shortness of breath, vision changes, headaches. Reports noticing an increase in lower extremity edema. Cardiovascular exam with heart regular rate and rhythm. Normal heart sounds, no murmurs present. Bilateral lower extremity 1+ pitting edema present. Lungs clear to auscultation bilaterally. Patient is currently taking hydrochlorothiazide 25mg  daily and losartan 100mg  daily. Refills provided today. Counseled patient to occasionally check blood pressure readings at home. Discussed options, plan to increase hydrochlorothiazide to 50mg  daily to help with swelling and blood pressure control. Follow-up in 4 weeks.     3. Hyperlipidemia associated with type 2 diabetes mellitus (HCC) Lipid panel done 04/21/2023 with total cholesterol 161, triglycerides 92, HDL 41, and LDL slightly elevated at 103. ASCVD risk calculated at 20%. Patient is currently on primary prevention- rosuvastatin 10mg  daily. Denies adverse side effects. Reports she is tolerating medication well and taking as  prescribed. Refill provided.   4. BMI 45.0-49.9, adult [facility]) Patient followed by [facility]. Currently tolerating Mounjaro well.   5. Lower extremity edema See #2-- Plan to increase hydrochlorothiazide from 25mg  daily to 50mg  daily.   6. Candidal intertrigo Patient would like refill of nystatin cream today.  - nystatin cream (MYCOSTATIN); Apply 1 Application topically 2 (two) times daily for up to 7 days.  Dispense: 30 g; Refill: 1   Return in about 4 weeks (around 07/01/2023) for HTN follow-up.   Alyson Reedy, FNP

## 2023-06-03 ENCOUNTER — Encounter (HOSPITAL_BASED_OUTPATIENT_CLINIC_OR_DEPARTMENT_OTHER): Payer: Self-pay | Admitting: Family Medicine

## 2023-06-03 ENCOUNTER — Ambulatory Visit (INDEPENDENT_AMBULATORY_CARE_PROVIDER_SITE_OTHER): Payer: 59 | Admitting: Family Medicine

## 2023-06-03 ENCOUNTER — Encounter: Payer: 59 | Attending: Family Medicine | Admitting: Registered"

## 2023-06-03 ENCOUNTER — Other Ambulatory Visit: Payer: Self-pay

## 2023-06-03 ENCOUNTER — Encounter: Payer: Self-pay | Admitting: Registered"

## 2023-06-03 VITALS — BP 153/77 | HR 71 | Ht 66.0 in | Wt 294.0 lb

## 2023-06-03 VITALS — Ht 66.0 in | Wt 295.5 lb

## 2023-06-03 DIAGNOSIS — E1159 Type 2 diabetes mellitus with other circulatory complications: Secondary | ICD-10-CM | POA: Diagnosis not present

## 2023-06-03 DIAGNOSIS — B372 Candidiasis of skin and nail: Secondary | ICD-10-CM | POA: Diagnosis not present

## 2023-06-03 DIAGNOSIS — Z6841 Body Mass Index (BMI) 40.0 and over, adult: Secondary | ICD-10-CM

## 2023-06-03 DIAGNOSIS — E1169 Type 2 diabetes mellitus with other specified complication: Secondary | ICD-10-CM | POA: Diagnosis not present

## 2023-06-03 DIAGNOSIS — R600 Localized edema: Secondary | ICD-10-CM

## 2023-06-03 DIAGNOSIS — Z794 Long term (current) use of insulin: Secondary | ICD-10-CM

## 2023-06-03 DIAGNOSIS — I152 Hypertension secondary to endocrine disorders: Secondary | ICD-10-CM | POA: Diagnosis not present

## 2023-06-03 DIAGNOSIS — Z7689 Persons encountering health services in other specified circumstances: Secondary | ICD-10-CM | POA: Diagnosis not present

## 2023-06-03 DIAGNOSIS — E119 Type 2 diabetes mellitus without complications: Secondary | ICD-10-CM

## 2023-06-03 DIAGNOSIS — E785 Hyperlipidemia, unspecified: Secondary | ICD-10-CM

## 2023-06-03 MED ORDER — NYSTATIN 100000 UNIT/GM EX CREA
1.0000 | TOPICAL_CREAM | Freq: Two times a day (BID) | CUTANEOUS | 1 refills | Status: DC
  Filled 2023-06-03: qty 30, 15d supply, fill #0
  Filled 2023-08-28: qty 30, 15d supply, fill #1

## 2023-06-03 MED ORDER — ROSUVASTATIN CALCIUM 10 MG PO TABS
10.0000 mg | ORAL_TABLET | Freq: Every day | ORAL | 0 refills | Status: DC
  Filled 2023-06-03 – 2023-08-06 (×2): qty 90, 90d supply, fill #0

## 2023-06-03 MED ORDER — HYDROCHLOROTHIAZIDE 50 MG PO TABS
50.0000 mg | ORAL_TABLET | Freq: Every day | ORAL | 2 refills | Status: DC
  Filled 2023-06-03: qty 30, 30d supply, fill #0

## 2023-06-03 MED ORDER — LEVOTHYROXINE SODIUM 112 MCG PO TABS
112.0000 ug | ORAL_TABLET | Freq: Every day | ORAL | 0 refills | Status: DC
  Filled 2023-06-03: qty 90, 90d supply, fill #0

## 2023-06-03 MED ORDER — LOSARTAN POTASSIUM 100 MG PO TABS
100.0000 mg | ORAL_TABLET | Freq: Every day | ORAL | 0 refills | Status: DC
  Filled 2023-06-03: qty 90, 90d supply, fill #0

## 2023-06-03 NOTE — Progress Notes (Signed)
Employee visit 2 of 3  Medical Nutrition Therapy  Appointment Start time:  [phone]  Appointment End time:  1712  Primary concerns today: swelling in feet and ankles, wants to start losing weight Referral diagnosis: N/A Preferred learning style: no preference indicated Learning readiness: contemplating, ready, change in progress  NUTRITION ASSESSMENT  Anthropometrics  Body Composition Scale Date  Current Body Weight 295.5  Total Body Fat % 48.5  Visceral Fat 20  Fat-Free Mass % 51.4   Total Body Water % 40.2  Muscle-Mass lbs 32.9  BMI 47.7  Body Fat Displacement          Torso  lbs 88.9         Left Leg  lbs 17.7         Right Leg  lbs 17.7         Left Arm  lbs 8.8         Right Arm   lbs 8.8    Clinical Medical Hx: new Dx of T2D this year Medications: reviewed Mounjaro, Vit D 50,000 weekly Labs:  HDL 41, TG 92, VLDL 10; LDL 103;  TSH 1.830  VD25OH 20.4 (L) Insulin 74.3(H) 05/01/23 Lab Results  Component Value Date   HGBA1C 6.6 (H) 04/21/2023   Notable Signs/Symptoms: weight gain, feet & ankles are swelling - today increase hydrochlorothiazide  Lifestyle & Dietary Hx Diet: apple daily, broccoli and spinach daily, activia yogurt. Pt states she ate [place] yogurt 2 weeks straight.   Pt she has been working toward goals of 90 g protien. Getting 10 min of sulight in the day but not extra walking.  Pt sates she has been eating at home more often, cooking balanced.  Estimated daily fluid intake: 50+ oz water Supplements: B vitamin Sleep: not assessed Stress / self-care: not assessed Current average weekly physical activity: not assessed  24-Hr Dietary Recall  First Meal: 2 eggs 8 oz Fairlife fat-free milk Snack: Activia vanilla  Second Meal: a variety of salads: broccoli salad with raisin, onions, fruit salad, 1/2 thigh, 1/2 potato. Snack: laughing cow, 5 crackers Third Meal: 1/2 thigh, broccoli salad Snack:  Beverages: 24-40 oz water (Smart water), milk   Estimated  Energy Needs Calories: not calculated  NUTRITION DIAGNOSIS  NB-1.1 Food and nutrition-related knowledge deficit As related to macros and relationship calories.  As evidenced by understanding reasoning for goals from healthy weight and wellness plan.  NUTRITION INTERVENTION  Nutrition education (E-1) on the following topics:  Visceral fat & exercise (body comp scale) Mindful eating, listening to body cues Water intake  Handouts Provided Include  Body comp handout  Learning Style & Readiness for Change Teaching method utilized: Visual & Auditory  Demonstrated degree of understanding via: Teach Back  Barriers to learning/adherence to lifestyle change: none  Goals Established by Pt walking inside house 7 min day, 7 days week for 30 days.  Get up every hour at work and move to help keep feet/ankles from swelling too much.  Great job taking healthy snacks to work to help you avoid cravings for sweet foods or other vending machines options!  Consider eating mindfully and paying attaching to your fullness cues   Aim for ~60 oz water daily  MONITORING & EVALUATION Dietary intake, weekly physical activity, and cravings in 1 month.

## 2023-06-03 NOTE — Patient Instructions (Addendum)
Great work meeting all of your goals from last visit: Getting outside for at least 10 min/day (vit D) Eating snacks during the day to help reduce cravings for sweets Cooking more meals at home And in addition to your goals you are making more mindful choices when eating out :-)  New Goals:  walking inside house 7 min day, 7 days week for 30 days.  Get up every hour at work and move to help keep feet/ankles from swelling too much.  Keep up the great job taking healthy snacks to work to help you avoid cravings for sweet foods or other vending machines options!  Consider eating mindfully, chewing food well, and paying attention to your fullness cues   Aim for ~60 oz water daily

## 2023-06-15 ENCOUNTER — Ambulatory Visit: Payer: 59 | Admitting: Bariatrics

## 2023-06-17 ENCOUNTER — Encounter: Payer: Self-pay | Admitting: Bariatrics

## 2023-06-17 ENCOUNTER — Other Ambulatory Visit: Payer: Self-pay

## 2023-06-17 ENCOUNTER — Ambulatory Visit: Payer: 59 | Admitting: Bariatrics

## 2023-06-17 VITALS — BP 138/82 | HR 90 | Temp 97.8°F | Ht 66.0 in | Wt 285.0 lb

## 2023-06-17 DIAGNOSIS — E119 Type 2 diabetes mellitus without complications: Secondary | ICD-10-CM

## 2023-06-17 DIAGNOSIS — Z6841 Body Mass Index (BMI) 40.0 and over, adult: Secondary | ICD-10-CM | POA: Diagnosis not present

## 2023-06-17 DIAGNOSIS — E559 Vitamin D deficiency, unspecified: Secondary | ICD-10-CM

## 2023-06-17 DIAGNOSIS — Z7985 Long-term (current) use of injectable non-insulin antidiabetic drugs: Secondary | ICD-10-CM | POA: Diagnosis not present

## 2023-06-17 MED ORDER — TIRZEPATIDE 7.5 MG/0.5ML ~~LOC~~ SOAJ
7.5000 mg | SUBCUTANEOUS | 0 refills | Status: DC
  Filled 2023-06-17 – 2023-06-22 (×3): qty 2, 28d supply, fill #0

## 2023-06-17 MED ORDER — TIRZEPATIDE 5 MG/0.5ML ~~LOC~~ SOAJ
5.0000 mg | SUBCUTANEOUS | 0 refills | Status: DC
  Filled 2023-06-17: qty 2, 28d supply, fill #0

## 2023-06-17 MED ORDER — VITAMIN D (ERGOCALCIFEROL) 1.25 MG (50000 UNIT) PO CAPS
50000.0000 [IU] | ORAL_CAPSULE | ORAL | 0 refills | Status: DC
  Filled 2023-06-17 – 2023-06-18 (×2): qty 5, 35d supply, fill #0
  Filled 2023-07-07: qty 4, 28d supply, fill #0

## 2023-06-17 NOTE — Progress Notes (Signed)
WEIGHT SUMMARY AND BIOMETRICS  Weight Lost Since Last Visit: 8lb   Vitals Temp: 97.8 F (36.6 C) BP: 138/82 Pulse Rate: 90 SpO2: 98 %   Anthropometric Measurements Height: 5\' 6"  (1.676 m) Weight: 285 lb (129.3 kg) BMI (Calculated): 46.02 Weight at Last Visit: 293lb Weight Lost Since Last Visit: 8lb Starting Weight: 290lb Total Weight Loss (lbs): 5 lb (2.268 kg)   Body Composition  Body Fat %: 51.1 % Fat Mass (lbs): 145.8 lbs Muscle Mass (lbs): 132.6 lbs Total Body Water (lbs): 96.4 lbs Visceral Fat Rating : 17   Other Clinical Data Fasting: no Labs: no Today's Visit #: 4 Starting Date: 04/21/23    OBESITY Pam Mckee is here to discuss her progress with her obesity treatment plan along with follow-up of her obesity related diagnoses.     Nutrition Plan: the Category 3 plan - 75% adherence.  Current exercise: none  Interim History:  She is down 8 lbs since her last visit.  Eating all of the food on the plan., Protein intake is as prescribed, Meeting protein goals., and Water intake is adequate.  Pharmacotherapy: Pam Mckee is on Mounjaro 5.0 mg SQ weekly Adverse side effects: None Hunger is moderately controlled.  Cravings are moderately controlled.  Assessment/Plan:   1. Vitamin D deficiency Vitamin D Deficiency Vitamin D is not at goal of 50.  Most recent vitamin D level was 20.4. She is on  prescription ergocalciferol 50,000 IU weekly. Lab Results  Component Value Date   VD25OH 20.4 (L) 04/21/2023   VD25OH 18.5 (L) 07/17/2020   VD25OH 33.4 06/23/2019    Plan: Refill prescription vitamin D 50,000 IU weekly.   Type II Diabetes HgbA1c is at goal. Last A1c was 6.6 CBGs: Not checking      Episodes of hypoglycemia: no Medication(s): Mounjaro 5.0 mg SQ weekly  Lab Results  Component Value Date   HGBA1C 6.6 (H) 04/21/2023   HGBA1C 6.4 (H) 07/17/2020   HGBA1C 5.9 (H) 06/23/2019   Lab Results  Component Value Date   LDLCALC 103 (H)  04/21/2023   CREATININE 0.76 04/21/2023   No results found for: "GFR"  Plan: Continue and refill Mounjaro 7.5 mg SQ weekly Continue all other medications.  Will keep all carbohydrates low both sweets and starches.  Will continue exercise regimen to 30 to 60 minutes on most days of the week.  Aim for 7 to 9 hours of sleep nightly.  Eat more low glycemic index foods.    Morbid Obesity: Current BMI BMI (Calculated): 46.02   Pharmacotherapy Plan Continue and refill  Mounjaro 7.5 mg SQ weekly  Pam Mckee is currently in the action stage of change. As such, her goal is to continue with weight loss efforts.  She has agreed to the Category 3 plan.  Exercise goals: Adults should also include muscle-strengthening activities that involve all major muscle groups on 2 or more days a week.  Behavioral modification strategies: increasing lean protein intake, meal planning , better snacking choices, decrease snacking , and keep healthy foods in the home.  Pam Mckee has agreed to follow-up with our clinic in 2 weeks.      Objective:   VITALS: Per patient if applicable, see vitals. GENERAL: Alert and in no acute distress. CARDIOPULMONARY: No increased WOB. Speaking in clear sentences.  PSYCH: Pleasant and cooperative. Speech normal rate and rhythm. Affect is appropriate. Insight and judgement are appropriate. Attention is focused, linear, and appropriate.  NEURO: Oriented as arrived to appointment on time with no prompting.  Attestation Statements:   This was prepared with the assistance of Engineer, civil (consulting).  Occasional wrong-word or sound-a-like substitutions may have occurred due to the inherent limitations of voice recognition software.   Corinna Capra, DO

## 2023-06-18 ENCOUNTER — Other Ambulatory Visit: Payer: Self-pay

## 2023-06-22 ENCOUNTER — Other Ambulatory Visit: Payer: Self-pay

## 2023-06-23 ENCOUNTER — Other Ambulatory Visit: Payer: Self-pay

## 2023-06-29 ENCOUNTER — Other Ambulatory Visit: Payer: Self-pay

## 2023-07-07 ENCOUNTER — Other Ambulatory Visit: Payer: Self-pay

## 2023-07-07 ENCOUNTER — Ambulatory Visit (HOSPITAL_BASED_OUTPATIENT_CLINIC_OR_DEPARTMENT_OTHER): Payer: 59 | Admitting: Family Medicine

## 2023-07-07 ENCOUNTER — Encounter (HOSPITAL_BASED_OUTPATIENT_CLINIC_OR_DEPARTMENT_OTHER): Payer: Self-pay | Admitting: Family Medicine

## 2023-07-07 VITALS — BP 135/84 | HR 81 | Ht 66.0 in | Wt 283.0 lb

## 2023-07-07 DIAGNOSIS — E1159 Type 2 diabetes mellitus with other circulatory complications: Secondary | ICD-10-CM | POA: Diagnosis not present

## 2023-07-07 DIAGNOSIS — Z7985 Long-term (current) use of injectable non-insulin antidiabetic drugs: Secondary | ICD-10-CM

## 2023-07-07 DIAGNOSIS — M5442 Lumbago with sciatica, left side: Secondary | ICD-10-CM

## 2023-07-07 DIAGNOSIS — G8929 Other chronic pain: Secondary | ICD-10-CM | POA: Diagnosis not present

## 2023-07-07 DIAGNOSIS — M23204 Derangement of unspecified medial meniscus due to old tear or injury, left knee: Secondary | ICD-10-CM | POA: Diagnosis not present

## 2023-07-07 DIAGNOSIS — I152 Hypertension secondary to endocrine disorders: Secondary | ICD-10-CM

## 2023-07-07 MED ORDER — LOSARTAN POTASSIUM 100 MG PO TABS
100.0000 mg | ORAL_TABLET | Freq: Every day | ORAL | 1 refills | Status: DC
  Filled 2023-07-07 – 2023-08-28 (×2): qty 90, 90d supply, fill #0
  Filled 2023-11-07 – 2023-12-03 (×2): qty 90, 90d supply, fill #1

## 2023-07-07 MED ORDER — PREDNISONE 10 MG PO TABS
ORAL_TABLET | ORAL | 0 refills | Status: DC
  Filled 2023-07-07: qty 17, 5d supply, fill #0

## 2023-07-07 MED ORDER — HYDROCHLOROTHIAZIDE 50 MG PO TABS
50.0000 mg | ORAL_TABLET | Freq: Every day | ORAL | 1 refills | Status: DC
  Filled 2023-07-07: qty 90, 90d supply, fill #0
  Filled 2023-10-12: qty 90, 90d supply, fill #1

## 2023-07-07 NOTE — Patient Instructions (Addendum)
Prednisone Instructions:  Day 1: take 3 tablets in the morning and 2 tablets at lunch.    Day 2: Take 3 tablets in the morning and 2 tablets at lunch.    Day 3: Take 2 tablets in the morning and 2 tablets at lunch.    Day 4: Take 1 tablet in the morning and 1 tablet at lunch.    Day 5: take 1 tablet in the morning.

## 2023-07-07 NOTE — Progress Notes (Signed)
Established Patient Office Visit  Subjective   Patient ID: Pam Mckee, female    DOB: 08/15/1971  Age: 52 y.o. MRN: [id number]  HYPERTENSION: Pam Mckee is a 52 year-old female patient who presents for the medical management of hypertension.  Patient's current hypertension medication regimen is: hydrochlorothiazide 50mg  daily & losartan 100mg  daily  Patient is currently taking prescribed medications for HTN.  Patient is regularly keeping a check on BP at home. Home blood pressure readings: 147/83, 141/85, 142/85, 140/83, 138/82, 142/81, 121/84, 138/73, 146/84, 115/82 Adhering to low sodium diet: yes Exercising Regularly: yes, walking 10 mins daily  Denies headache, dizziness, CP, SHOB, vision changes.    L sided pain: numbness/tingling in left leg  More numbness and more pain for the past few months and is no longer able to put up with this sensation  Cortisone shot a few months ago March 2017- Had an MRI of her knee, with left medial meniscal complete tear requiring surgery but never got done   BP Readings from Last 3 Encounters:  07/07/23 135/84  06/17/23 138/82  06/03/23 (!) 153/77   Review of Systems  Constitutional:  Negative for malaise/fatigue.  Eyes:  Negative for blurred vision and double vision.  Respiratory:  Negative for cough and shortness of breath.   Cardiovascular:  Negative for chest pain, palpitations and leg swelling.  Gastrointestinal:  Negative for abdominal pain, nausea and vomiting.  Musculoskeletal:  Negative for myalgias.  Neurological:  Negative for dizziness, weakness and headaches.  Psychiatric/Behavioral:  Negative for suicidal ideas.      Objective:    BP 135/84   Pulse 81   Ht 5\' 6"  (1.676 m)   Wt 283 lb (128.4 kg)   SpO2 98%   BMI 45.68 kg/m  BP Readings from Last 3 Encounters:  07/07/23 135/84  06/17/23 138/82  06/03/23 (!) 153/77    Physical Exam Constitutional:      Appearance: Normal appearance.  Cardiovascular:     Rate  and Rhythm: Normal rate and regular rhythm.     Pulses: Normal pulses.     Heart sounds: Normal heart sounds.  Pulmonary:     Effort: Pulmonary effort is normal.     Breath sounds: Normal breath sounds.  Neurological:     Mental Status: She is alert.  Psychiatric:        Mood and Affect: Mood normal.        Behavior: Behavior normal.     Assessment & Plan:   1. Hypertension associated with type 2 diabetes mellitus (HCC) Patient presents today with slightly elevated blood pressure, recheck improved blood pressure. Patient in no acute distress and is well-appearing. Denies chest pain, shortness of breath, lower extremity edema, vision changes, headaches. Cardiovascular exam with heart regular rate and rhythm. Normal heart sounds, no murmurs present. No lower extremity edema present. Lungs clear to auscultation bilaterally. Patient is currently taking hydrochlorothiazide 50mg  daily & losartan 100mg  daily. Refills provided today. Return to office sooner if blood pressure begins to increase greater than 130/80.    - losartan (COZAAR) 100 MG tablet; Take 1 tablet (100 mg total) by mouth daily.  Dispense: 90 tablet; Refill: 1 - hydrochlorothiazide (HYDRODIURIL) 50 MG tablet; Take 1 tablet (50 mg total) by mouth daily.  Dispense: 90 tablet; Refill: 1  2. Chronic midline low back pain with left-sided sciatica Patient presents today with left sided pain in her mid-lower back down to her left knee, along with occasional numbness/tingling. No red flags  present on physical exam. Denies significant sharp pain, changes in bowel/bladder incontinence/habits, saddle anesthesia, or weakness. Positive L SLR and negative R SLR. Will treat as sciatica with prednisone taper. Advised patient this may be related to complete tear of her medial meniscus that was diagnosed in 2017. Advised patient may require ortho referral if no improvement in numbness/tingling sensation.  - predniSONE (STERAPRED UNI-PAK 21 TAB) 10 MG  (21) TBPK tablet; Dispense: 21 each; Refill: 0   Return in about 3 months (around 10/07/2023) for HTN follow-up.    Alyson Reedy, FNP

## 2023-07-08 ENCOUNTER — Other Ambulatory Visit: Payer: Self-pay

## 2023-07-08 DIAGNOSIS — G8929 Other chronic pain: Secondary | ICD-10-CM | POA: Insufficient documentation

## 2023-07-09 ENCOUNTER — Other Ambulatory Visit: Payer: Self-pay

## 2023-07-15 ENCOUNTER — Encounter: Payer: Self-pay | Admitting: Bariatrics

## 2023-07-15 ENCOUNTER — Other Ambulatory Visit: Payer: Self-pay

## 2023-07-15 ENCOUNTER — Ambulatory Visit: Payer: 59 | Admitting: Bariatrics

## 2023-07-15 VITALS — BP 120/74 | HR 90 | Temp 98.4°F | Ht 66.0 in | Wt 284.0 lb

## 2023-07-15 DIAGNOSIS — E119 Type 2 diabetes mellitus without complications: Secondary | ICD-10-CM | POA: Diagnosis not present

## 2023-07-15 DIAGNOSIS — E559 Vitamin D deficiency, unspecified: Secondary | ICD-10-CM

## 2023-07-15 DIAGNOSIS — Z6841 Body Mass Index (BMI) 40.0 and over, adult: Secondary | ICD-10-CM

## 2023-07-15 DIAGNOSIS — Z7985 Long-term (current) use of injectable non-insulin antidiabetic drugs: Secondary | ICD-10-CM | POA: Diagnosis not present

## 2023-07-15 MED ORDER — VITAMIN D (ERGOCALCIFEROL) 1.25 MG (50000 UNIT) PO CAPS
50000.0000 [IU] | ORAL_CAPSULE | ORAL | 0 refills | Status: DC
  Filled 2023-07-15: qty 5, 35d supply, fill #0
  Filled 2023-08-06: qty 4, 28d supply, fill #0

## 2023-07-15 MED ORDER — TIRZEPATIDE 10 MG/0.5ML ~~LOC~~ SOAJ
10.0000 mg | SUBCUTANEOUS | 0 refills | Status: DC
  Filled 2023-07-15 – 2023-07-17 (×2): qty 2, 28d supply, fill #0

## 2023-07-15 NOTE — Progress Notes (Signed)
WEIGHT SUMMARY AND BIOMETRICS  Weight Lost Since Last Visit: 1lb  Vitals Temp: 98.4 F (36.9 C) BP: 120/74 Pulse Rate: 90 SpO2: 97 %   Anthropometric Measurements Height: 5\' 6"  (1.676 m) Weight: 284 lb (128.8 kg) BMI (Calculated): 45.86 Weight at Last Visit: 285lb Weight Lost Since Last Visit: 1lb Starting Weight: 290lb Total Weight Loss (lbs): 6 lb (2.722 kg)   Body Composition  Body Fat %: 52.2 % Fat Mass (lbs): 148.6 lbs Muscle Mass (lbs): 129.4 lbs Total Body Water (lbs): 98.6 lbs Visceral Fat Rating : 18   Other Clinical Data Fasting: no Labs: no Today's Visit #: 5 Starting Date: 04/21/23    OBESITY Kyira is here to discuss her progress with her obesity treatment plan along with follow-up of her obesity related diagnoses.     Nutrition Plan: the Category 3 plan - 75% adherence.  Current exercise: none  Interim History:  She is down 1 additional lb since her last visit.  Eating all of the food on the plan., Protein intake is as prescribed, Water intake is inadequate., Reports polyphagia, and Denies excessive cravings.  Pharmacotherapy: Shalanda is on Mounjaro 7.5 mg SQ weekly Adverse side effects: None Hunger is moderately controlled.  Cravings are moderately controlled.  Assessment/Plan:   1. Vitamin D deficiency Vitamin D Deficiency Vitamin D is not at goal of 50.  Most recent vitamin D level was 20.4. She is on  prescription ergocalciferol 50,000 IU weekly. Lab Results  Component Value Date   VD25OH 20.4 (L) 04/21/2023   VD25OH 18.5 (L) 07/17/2020   VD25OH 33.4 06/23/2019    Plan: Refill prescription vitamin D 50,000 IU weekly.   Type II Diabetes HgbA1c is at goal. Last A1c was 6.6 CBGs: Not checking      Episodes of hypoglycemia: none Medication(s): Mounjaro 10 mg SQ weekly  Lab Results  Component Value Date   HGBA1C 6.6 (H) 04/21/2023   HGBA1C 6.4 (H) 07/17/2020   HGBA1C 5.9 (H) 06/23/2019   Lab Results  Component  Value Date   LDLCALC 103 (H) 04/21/2023   CREATININE 0.76 04/21/2023   No results found for: "GFR"  Plan: Continue and increase dose Mounjaro 10 mg SQ weekly Continue all other medications.  Will keep all carbohydrates low both sweets and starches.  Will continue exercise regimen to 30 to 60 minutes on most days of the week.  Aim for 7 to 9 hours of sleep nightly.  Eat more low glycemic index foods.     Morbid Obesity: Current BMI BMI (Calculated): 45.86   Pharmacotherapy Plan Continue and increase dose  Mounjaro 10 mg SQ weekly  Rashanna is currently in the action stage of change. As such, her goal is to continue with weight loss efforts.  She has agreed to the Category 3 plan.  Exercise goals: All adults should avoid inactivity. Some physical activity is better than none, and adults who participate in any amount of physical activity gain some health benefits.  Behavioral modification strategies: increasing lean protein intake, meal planning , better snacking choices, increasing fiber rich foods, keep healthy foods in the home, and mindful eating.  Latesia has agreed to follow-up with our clinic in 3 weeks.      Objective:   VITALS: Per patient if applicable, see vitals. GENERAL: Alert and in no acute distress. CARDIOPULMONARY: No increased WOB. Speaking in clear sentences.  PSYCH: Pleasant and cooperative. Speech normal rate and rhythm. Affect is appropriate. Insight and judgement are appropriate. Attention is focused,  linear, and appropriate.  NEURO: Oriented as arrived to appointment on time with no prompting.   Attestation Statements:    This was prepared with the assistance of Engineer, civil (consulting).  Occasional wrong-word or sound-a-like substitutions may have occurred due to the inherent limitations of voice recognition software.

## 2023-07-16 ENCOUNTER — Other Ambulatory Visit (HOSPITAL_BASED_OUTPATIENT_CLINIC_OR_DEPARTMENT_OTHER): Payer: Self-pay | Admitting: Family Medicine

## 2023-07-16 ENCOUNTER — Encounter (HOSPITAL_BASED_OUTPATIENT_CLINIC_OR_DEPARTMENT_OTHER): Payer: Self-pay | Admitting: Family Medicine

## 2023-07-16 ENCOUNTER — Telehealth: Payer: Self-pay

## 2023-07-16 DIAGNOSIS — G8929 Other chronic pain: Secondary | ICD-10-CM

## 2023-07-16 NOTE — Telephone Encounter (Signed)
PA submitted through Cover My Meds for [facility]. Awaiting insurance determination. Key: [id number]

## 2023-07-17 ENCOUNTER — Other Ambulatory Visit: Payer: Self-pay

## 2023-07-17 ENCOUNTER — Other Ambulatory Visit (HOSPITAL_COMMUNITY): Payer: Self-pay

## 2023-07-20 NOTE — Telephone Encounter (Signed)
Per Cover My  Meds:  The request has been approved. The authorization is effective from 07/20/2023 to 07/18/2024, as long as the member is enrolled in their current health plan. A written notification letter will follow with additional details.

## 2023-07-29 ENCOUNTER — Encounter: Payer: 59 | Attending: Family Medicine | Admitting: Skilled Nursing Facility1

## 2023-07-29 ENCOUNTER — Encounter: Payer: Self-pay | Admitting: Skilled Nursing Facility1

## 2023-07-29 VITALS — Ht 67.0 in | Wt 283.8 lb

## 2023-07-29 DIAGNOSIS — E631 Imbalance of constituents of food intake: Secondary | ICD-10-CM | POA: Insufficient documentation

## 2023-07-29 NOTE — Progress Notes (Signed)
Employee visit 2 of 3  Medical Nutrition Therapy  Appointment Start time: 7:29  Appointment End time:  7:56  Primary concerns today: swelling in feet and ankles, wants to start losing weight Referral diagnosis: N/A Preferred learning style: no preference indicated Learning readiness: contemplating, ready, change in progress  NUTRITION ASSESSMENT  Anthropometrics  Body Composition Scale   Current Body Weight 295.5  Total Body Fat % 48.5  Visceral Fat 20  Fat-Free Mass % 51.4   Total Body Water % 40.2  Muscle-Mass lbs 32.9  BMI 47.7  Body Fat Displacement          Torso  lbs 88.9         Left Leg  lbs 17.7         Right Leg  lbs 17.7         Left Arm  lbs 8.8         Right Arm   lbs 8.8   Body Composition Scale 07/29/2023  Current Body Weight 283.8  Total Body Fat % 47  Visceral Fat 18  Fat-Free Mass % 52.9   Total Body Water % 40.9  Muscle-Mass lbs 33.7  BMI 44.2  Body Fat Displacement          Torso  lbs 82.8         Left Leg  lbs 16.5         Right Leg  lbs 16.5         Left Arm  lbs 8.2         Right Arm   lbs 8.2    Clinical Medical Hx: new Dx of T2D this year Medications: reviewed Mounjaro, Vit D 50,000 weekly Labs:  HDL 41, TG 92, VLDL 10; LDL 103;  TSH 1.830  VD25OH 20.4 (L) Insulin 74.3(H) 05/01/23 Lab Results  Component Value Date   HGBA1C 6.6 (H) 04/21/2023    Notable Signs/Symptoms: weight gain, feet & ankles are swelling - today increase hydrochlorothiazide   Lifestyle & Dietary Hx Diet: apple daily, broccoli and spinach daily, activia yogurt. Pt states she ate [place] yogurt 2 weeks straight.   Pt she has been working toward goals of 90 g protien. Getting 10 min of sulight in the day but not extra walking.  Pt sates she has been eating at home more often, cooking balanced.  Pt states she has been counting all her proteins and reading the labels.  Pt states she eats bananas even though they are not on her approved list with healthy weight.   Pt states she doe snot walk as much as she should due to pain.   Estimated daily fluid intake: 50+ oz water Supplements: B vitamin Sleep: not assessed Stress / self-care: not assessed Current average weekly physical activity: not assessed  24-Hr Dietary Recall  First Meal: 2-3 eggs and 2 bacon and fairlife milk Snack: Activia vanilla  Snack: fruit  Second Meal: a variety of salads: broccoli salad with raisin, onions, fruit salad, 1/2 thigh, 1/2 potato. Snack: laughing cow, 5 crackers Third Meal: 1/2 thigh, broccoli salad Snack:  Beverages: 24-40 oz water (Smart water), milk   Estimated Energy Needs Calories: not calculated  NUTRITION DIAGNOSIS  NB-1.1 Food and nutrition-related knowledge deficit As related to macros and relationship calories.  As evidenced by understanding reasoning for goals from healthy weight and wellness plan.  NUTRITION INTERVENTION  Nutrition education (E-1) on the following topics:  Visceral fat & exercise (body comp scale) Mindful eating, listening to body  cues Water intake  Handouts Provided Include  Body comp handout  Learning Style & Readiness for Change Teaching method utilized: Visual & Auditory  Demonstrated degree of understanding via: Teach Back  Barriers to learning/adherence to lifestyle change: none  Goals Established by Pt: continued walking inside house 7 min day, 7 days week for 30 days.  Get up every hour at work and move to help keep feet/ankles from swelling too much.  Great job taking healthy snacks to work to help you avoid cravings for sweet foods or other vending machines options!  Consider eating mindfully and paying attaching to your fullness cues   Aim for ~60 oz water daily  MONITORING & EVALUATION Dietary intake, weekly physical activity, and cravings

## 2023-08-06 ENCOUNTER — Other Ambulatory Visit: Payer: Self-pay

## 2023-08-12 ENCOUNTER — Other Ambulatory Visit: Payer: Self-pay

## 2023-08-12 ENCOUNTER — Ambulatory Visit: Payer: 59 | Admitting: Nurse Practitioner

## 2023-08-12 ENCOUNTER — Encounter: Payer: Self-pay | Admitting: Nurse Practitioner

## 2023-08-12 VITALS — BP 136/83 | HR 72 | Temp 98.0°F | Ht 66.0 in | Wt 277.0 lb

## 2023-08-12 DIAGNOSIS — I152 Hypertension secondary to endocrine disorders: Secondary | ICD-10-CM | POA: Diagnosis not present

## 2023-08-12 DIAGNOSIS — Z6841 Body Mass Index (BMI) 40.0 and over, adult: Secondary | ICD-10-CM | POA: Diagnosis not present

## 2023-08-12 DIAGNOSIS — E119 Type 2 diabetes mellitus without complications: Secondary | ICD-10-CM | POA: Diagnosis not present

## 2023-08-12 DIAGNOSIS — Z7985 Long-term (current) use of injectable non-insulin antidiabetic drugs: Secondary | ICD-10-CM | POA: Diagnosis not present

## 2023-08-12 DIAGNOSIS — E559 Vitamin D deficiency, unspecified: Secondary | ICD-10-CM

## 2023-08-12 DIAGNOSIS — E1159 Type 2 diabetes mellitus with other circulatory complications: Secondary | ICD-10-CM | POA: Diagnosis not present

## 2023-08-12 MED ORDER — VITAMIN D (ERGOCALCIFEROL) 1.25 MG (50000 UNIT) PO CAPS
50000.0000 [IU] | ORAL_CAPSULE | ORAL | 0 refills | Status: DC
  Filled 2023-08-12 – 2023-09-07 (×3): qty 5, 35d supply, fill #0

## 2023-08-12 MED ORDER — TIRZEPATIDE 10 MG/0.5ML ~~LOC~~ SOAJ
10.0000 mg | SUBCUTANEOUS | 0 refills | Status: DC
  Filled 2023-08-12 – 2023-08-24 (×2): qty 2, 28d supply, fill #0

## 2023-08-12 NOTE — Progress Notes (Signed)
Office: [phone]  /  Fax: [phone]  WEIGHT SUMMARY AND BIOMETRICS  Weight Lost Since Last Visit: 7lb  Weight Gained Since Last Visit: 0lb   Vitals Temp: 98 F (36.7 C) BP: 136/83 Pulse Rate: 72 SpO2: 100 %   Anthropometric Measurements Height: 5\' 6"  (1.676 m) Weight: 277 lb (125.6 kg) BMI (Calculated): 44.73 Weight at Last Visit: 284lb Weight Lost Since Last Visit: 7lb Weight Gained Since Last Visit: 0lb Starting Weight: 290lb Total Weight Loss (lbs): 13 lb (5.897 kg)   Body Composition  Body Fat %: 51.6 % Fat Mass (lbs): 143.2 lbs Muscle Mass (lbs): 127.6 lbs Total Body Water (lbs): 94 lbs Visceral Fat Rating : 17   Other Clinical Data Fasting: no Labs: no Today's Visit #: 6 Starting Date: 04/21/23     HPI  Chief Complaint: OBESITY  Pam Mckee is here to discuss her progress with her obesity treatment plan. She is on the the Category 3 Plan and states she is following her eating plan approximately 60 % of the time. She states she is exercising 10 minutes  walking 7 days per week.   Interval History:  Since last office visit she has lost 7 pounds.  Reports that she is overall doing well and is pleased with her progress.  Her highest weight was 298 lbs.    BF:  fairlife milk, 2-3 eggs, 2 pieces meat Snack:  string cheese, yogurt, fruit Lunch:  protein, vegetables Snack:  fruti Dinner: protein, vegetables and sometimes rice Drinks:  water, fairlife milk, occ soda, flavored water  She is going to [place] next week to celebrate her 2 year anniversary     Pharmacotherapy for weight loss: She is not currently taking medications  for medical weight loss.   Previous pharmacotherapy for medical weight loss:  Ozempic  Bariatric surgery:  Patient has not had bariatric surgery  Pharmacotherapy for DMT2:  She is currently taking Mounjaro 10mg  (x 3 doses).  Denies side effects.   Last A1c was 6.6 She is not checking BS at home.   Episodes of  hypoglycemia: no On ACE or ARB, ASA 81mg  and statin.  Last eye exam:  May 2024 She has tried Ozempic in the past.   Reports an occasional polyphagia and cravings.  Lab Results  Component Value Date   HGBA1C 6.6 (H) 04/21/2023   HGBA1C 6.4 (H) 07/17/2020   HGBA1C 5.9 (H) 06/23/2019   Lab Results  Component Value Date   LDLCALC 103 (H) 04/21/2023   CREATININE 0.76 04/21/2023    Hypertension Hypertension BP looks better.  Medication(s): hydrochlorothiazide 50mg  (increased by PCP at last visit), Cozaar 100mg   Denies chest pain, palpitations and SOB.  BP Readings from Last 3 Encounters:  08/12/23 136/83  07/15/23 120/74  07/07/23 135/84   Lab Results  Component Value Date   CREATININE 0.76 04/21/2023   CREATININE 0.76 07/17/2020   CREATININE 0.82 06/23/2019    Vit D deficiency  She is taking Vit D 50,000 IU weekly.  Denies side effects.  Denies nausea, vomiting or muscle weakness.    Lab Results  Component Value Date   VD25OH 20.4 (L) 04/21/2023   VD25OH 18.5 (L) 07/17/2020   VD25OH 33.4 06/23/2019    PHYSICAL EXAM:  Blood pressure 136/83, pulse 72, temperature 98 F (36.7 C), height 5\' 6"  (1.676 m), weight 277 lb (125.6 kg), SpO2 100%. Body mass index is 44.71 kg/m.  General: She is overweight, cooperative, alert, well developed, and in no acute distress. PSYCH:  Has normal mood, affect and thought process.   Extremities: No edema.  Neurologic: No gross sensory or motor deficits. No tremors or fasciculations noted.    DIAGNOSTIC DATA REVIEWED:  BMET    Component Value Date/Time   NA 136 04/21/2023 0902   K 4.2 04/21/2023 0902   CL 97 04/21/2023 0902   CO2 25 04/21/2023 0902   GLUCOSE 107 (H) 04/21/2023 0902   GLUCOSE 86 09/09/2016 0837   BUN 15 04/21/2023 0902   CREATININE 0.76 04/21/2023 0902   CREATININE 0.84 09/09/2016 0837   CALCIUM 9.2 04/21/2023 0902   GFRNONAA 93 07/17/2020 0835   GFRNONAA >89 01/14/2016 1328   GFRAA 107 07/17/2020 0835    GFRAA >89 01/14/2016 1328   Lab Results  Component Value Date   HGBA1C 6.6 (H) 04/21/2023   HGBA1C 5.5 01/14/2016   Lab Results  Component Value Date   INSULIN 74.3 (H) 04/21/2023   Lab Results  Component Value Date   TSH 1.830 04/21/2023   CBC    Component Value Date/Time   WBC 9.0 03/08/2018 1158   WBC 11.2 (A) 11/24/2016 1311   WBC 7.6 11/19/2016 1337   RBC 4.63 03/08/2018 1158   RBC 5.22 11/24/2016 1311   RBC 4.47 11/19/2016 1337   HGB 11.7 03/08/2018 1158   HCT 37.4 03/08/2018 1158   PLT 353 03/08/2018 1158   MCV 81 03/08/2018 1158   MCH 25.3 (L) 03/08/2018 1158   MCH 25.2 (A) 11/24/2016 1311   MCH 24.4 (L) 11/19/2016 1337   MCHC 31.3 (L) 03/08/2018 1158   MCHC 34.0 11/24/2016 1311   MCHC 31.3 (L) 11/19/2016 1337   RDW 15.0 03/08/2018 1158   Iron Studies    Component Value Date/Time   IRON 30 (L) 07/12/2015 1826   TIBC 371 07/12/2015 1826   FERRITIN 82 03/08/2018 1158   IRONPCTSAT 8 (L) 07/12/2015 1826   Lipid Panel     Component Value Date/Time   CHOL 161 04/21/2023 0902   TRIG 92 04/21/2023 0902   HDL 41 04/21/2023 0902   CHOLHDL 5.3 (H) 07/17/2020 0835   CHOLHDL 4.3 09/09/2016 0825   VLDL 10 09/09/2016 0825   LDLCALC 103 (H) 04/21/2023 0902   Hepatic Function Panel     Component Value Date/Time   PROT 7.5 04/21/2023 0902   ALBUMIN 4.2 04/21/2023 0902   AST 18 04/21/2023 0902   ALT 15 04/21/2023 0902   ALKPHOS 109 04/21/2023 0902   BILITOT 0.3 04/21/2023 0902      Component Value Date/Time   TSH 1.830 04/21/2023 0902   Nutritional Lab Results  Component Value Date   VD25OH 20.4 (L) 04/21/2023   VD25OH 18.5 (L) 07/17/2020   VD25OH 33.4 06/23/2019     ASSESSMENT AND PLAN  TREATMENT PLAN FOR OBESITY:  Recommended Dietary Goals  Pam Mckee is currently in the action stage of change. As such, her goal is to continue weight management plan. She has agreed to the Category 3 Plan.  Behavioral Intervention  We discussed the following  Behavioral Modification Strategies today: increasing lean protein intake, decreasing simple carbohydrates , increasing vegetables, increasing lower glycemic fruits, increasing water intake, reading food labels , keeping healthy foods at home, continue to practice mindfulness when eating, and planning for success.  Additional resources provided today: NA  Recommended Physical Activity Goals  Pam Mckee has been advised to work up to 150 minutes of moderate intensity aerobic activity a week and strengthening exercises 2-3 times per week for cardiovascular health, weight  loss maintenance and preservation of muscle mass.   She has agreed to Continue current level of physical activity , Think about ways to increase daily physical activity and overcoming barriers to exercise, and Increase physical activity in their day and reduce sedentary time (increase NEAT).    ASSOCIATED CONDITIONS ADDRESSED TODAY  Action/Plan  Type 2 diabetes mellitus without complication, without long-term current use of insulin (HCC) -     Continue Tirzepatide; Inject 10 mg into the skin once a week.  Dispense: 2 mL; Refill: 0  Good blood sugar control is important to decrease the likelihood of diabetic complications such as nephropathy, neuropathy, limb loss, blindness, coronary artery disease, and death. Intensive lifestyle modification including diet, exercise and weight loss are the first line of treatment for diabetes.    Vitamin D deficiency -     Vitamin D (Ergocalciferol); Take 1 capsule (50,000 Units total) by mouth once a week.  Dispense: 5 capsule; Refill: 0  Low Vitamin D level contributes to fatigue and are associated with obesity, breast, and colon cancer. She agrees to continue to take prescription Vitamin D @50 ,000 IU every week and will follow-up for routine testing of Vitamin D, at least 2-3 times per year to avoid over-replacement.   Hypertension associated with type 2 diabetes mellitus (HCC) Continue  follow-up with PCP.  Continue medications as directed.  Morbid obesity (HCC)  BMI 40.0-44.9, adult (HCC)      Will check labs in 1-3 months.  Patient deferred labs today.   Return in about 3 weeks (around 09/02/2023).Marland Kitchen She was informed of the importance of frequent follow up visits to maximize her success with intensive lifestyle modifications for her multiple health conditions.   ATTESTASTION STATEMENTS:  Reviewed by clinician on day of visit: allergies, medications, problem list, medical history, surgical history, family history, social history, and previous encounter notes.     Theodis Sato. Pam Jurewicz FNP-C

## 2023-08-24 ENCOUNTER — Other Ambulatory Visit: Payer: Self-pay

## 2023-08-25 ENCOUNTER — Other Ambulatory Visit: Payer: Self-pay

## 2023-08-28 ENCOUNTER — Other Ambulatory Visit: Payer: Self-pay

## 2023-08-28 ENCOUNTER — Other Ambulatory Visit (HOSPITAL_BASED_OUTPATIENT_CLINIC_OR_DEPARTMENT_OTHER): Payer: Self-pay | Admitting: Family Medicine

## 2023-08-28 MED ORDER — LEVOTHYROXINE SODIUM 112 MCG PO TABS
112.0000 ug | ORAL_TABLET | Freq: Every day | ORAL | 0 refills | Status: DC
  Filled 2023-08-28: qty 90, 90d supply, fill #0

## 2023-09-02 ENCOUNTER — Encounter: Payer: Self-pay | Admitting: Nurse Practitioner

## 2023-09-02 ENCOUNTER — Ambulatory Visit: Payer: 59 | Admitting: Nurse Practitioner

## 2023-09-02 ENCOUNTER — Other Ambulatory Visit: Payer: Self-pay

## 2023-09-02 VITALS — BP 134/82 | HR 86 | Temp 98.2°F | Ht 66.0 in | Wt 269.0 lb

## 2023-09-02 DIAGNOSIS — Z7985 Long-term (current) use of injectable non-insulin antidiabetic drugs: Secondary | ICD-10-CM

## 2023-09-02 DIAGNOSIS — Z6841 Body Mass Index (BMI) 40.0 and over, adult: Secondary | ICD-10-CM

## 2023-09-02 DIAGNOSIS — E119 Type 2 diabetes mellitus without complications: Secondary | ICD-10-CM

## 2023-09-02 MED ORDER — TIRZEPATIDE 10 MG/0.5ML ~~LOC~~ SOAJ
10.0000 mg | SUBCUTANEOUS | 0 refills | Status: DC
  Filled 2023-09-02 – 2023-09-16 (×3): qty 2, 28d supply, fill #0

## 2023-09-02 NOTE — Addendum Note (Signed)
Addended by: Irene Limbo on: 09/02/2023 02:52 PM   Modules accepted: Level of Service

## 2023-09-02 NOTE — Progress Notes (Signed)
Office: [phone]  /  Fax: [phone]  WEIGHT SUMMARY AND BIOMETRICS  Weight Lost Since Last Visit: 8lb  Weight Gained Since Last Visit: 0lb   Vitals Temp: 98.2 F (36.8 C) BP: 134/82 Pulse Rate: 86 SpO2: 97 %   Anthropometric Measurements Height: 5\' 6"  (1.676 m) Weight: 269 lb (122 kg) BMI (Calculated): 43.44 Weight at Last Visit: 277lb Weight Lost Since Last Visit: 8lb Weight Gained Since Last Visit: 0lb Starting Weight: 290lb Total Weight Loss (lbs): 21 lb (9.526 kg)   Body Composition  Body Fat %: 50.1 % Fat Mass (lbs): 134.8 lbs Muscle Mass (lbs): 127.6 lbs Total Body Water (lbs): 92.2 lbs Visceral Fat Rating : 16   Other Clinical Data Fasting: No Labs: No Today's Visit #: 7 Starting Date: 04/21/23     HPI  Chief Complaint: OBESITY  Pam Mckee is here to discuss her progress with her obesity treatment plan. She is on the the Category 3 Plan and states she is following her eating plan approximately 50 % of the time. She states she is exercising 10 minutes 4 days per week.   Interval History:  Since last office visit she has lost 8 pounds. She went on vacation since her last visit.  She has overall done well with weight loss.  She is watching her portion size and making healthier choices.  Working on Field seismologist intake.  She is walking 2-3 days per week and sometimes will walk twice per day.   Her highest weight was 298 lbs.    Pharmacotherapy for weight loss: She is not currently taking medications  for medical weight loss.      Previous pharmacotherapy for medical weight loss:  Ozempic  Bariatric surgery:  She has not had bariatric surgery  Pharmacotherapy for DMT2:  She is currently taking Mounjaro 10 mg.  Denies side effects.   Last A1c was 6.6 She is not checking BS at home.   Episodes of hypoglycemia: no On ARB and statin.  Last eye exam:  May 2024 She has tried Ozempic in the past.    Lab Results  Component Value Date    HGBA1C 6.6 (H) 04/21/2023   HGBA1C 6.4 (H) 07/17/2020   HGBA1C 5.9 (H) 06/23/2019   Lab Results  Component Value Date   LDLCALC 103 (H) 04/21/2023   CREATININE 0.76 04/21/2023        PHYSICAL EXAM:  Blood pressure 134/82, pulse 86, temperature 98.2 F (36.8 C), height 5\' 6"  (1.676 m), weight 269 lb (122 kg), SpO2 97%. Body mass index is 43.42 kg/m.  General: She is overweight, cooperative, alert, well developed, and in no acute distress. PSYCH: Has normal mood, affect and thought process.   Extremities: No edema.  Neurologic: No gross sensory or motor deficits. No tremors or fasciculations noted.    DIAGNOSTIC DATA REVIEWED:  BMET    Component Value Date/Time   NA 136 04/21/2023 0902   K 4.2 04/21/2023 0902   CL 97 04/21/2023 0902   CO2 25 04/21/2023 0902   GLUCOSE 107 (H) 04/21/2023 0902   GLUCOSE 86 09/09/2016 0837   BUN 15 04/21/2023 0902   CREATININE 0.76 04/21/2023 0902   CREATININE 0.84 09/09/2016 0837   CALCIUM 9.2 04/21/2023 0902   GFRNONAA 93 07/17/2020 0835   GFRNONAA >89 01/14/2016 1328   GFRAA 107 07/17/2020 0835   GFRAA >89 01/14/2016 1328   Lab Results  Component Value Date   HGBA1C 6.6 (H) 04/21/2023   HGBA1C 5.5 01/14/2016  Lab Results  Component Value Date   INSULIN 74.3 (H) 04/21/2023   Lab Results  Component Value Date   TSH 1.830 04/21/2023   CBC    Component Value Date/Time   WBC 9.0 03/08/2018 1158   WBC 11.2 (A) 11/24/2016 1311   WBC 7.6 11/19/2016 1337   RBC 4.63 03/08/2018 1158   RBC 5.22 11/24/2016 1311   RBC 4.47 11/19/2016 1337   HGB 11.7 03/08/2018 1158   HCT 37.4 03/08/2018 1158   PLT 353 03/08/2018 1158   MCV 81 03/08/2018 1158   MCH 25.3 (L) 03/08/2018 1158   MCH 25.2 (A) 11/24/2016 1311   MCH 24.4 (L) 11/19/2016 1337   MCHC 31.3 (L) 03/08/2018 1158   MCHC 34.0 11/24/2016 1311   MCHC 31.3 (L) 11/19/2016 1337   RDW 15.0 03/08/2018 1158   Iron Studies    Component Value Date/Time   IRON 30 (L) 07/12/2015  1826   TIBC 371 07/12/2015 1826   FERRITIN 82 03/08/2018 1158   IRONPCTSAT 8 (L) 07/12/2015 1826   Lipid Panel     Component Value Date/Time   CHOL 161 04/21/2023 0902   TRIG 92 04/21/2023 0902   HDL 41 04/21/2023 0902   CHOLHDL 5.3 (H) 07/17/2020 0835   CHOLHDL 4.3 09/09/2016 0825   VLDL 10 09/09/2016 0825   LDLCALC 103 (H) 04/21/2023 0902   Hepatic Function Panel     Component Value Date/Time   PROT 7.5 04/21/2023 0902   ALBUMIN 4.2 04/21/2023 0902   AST 18 04/21/2023 0902   ALT 15 04/21/2023 0902   ALKPHOS 109 04/21/2023 0902   BILITOT 0.3 04/21/2023 0902      Component Value Date/Time   TSH 1.830 04/21/2023 0902   Nutritional Lab Results  Component Value Date   VD25OH 20.4 (L) 04/21/2023   VD25OH 18.5 (L) 07/17/2020   VD25OH 33.4 06/23/2019     ASSESSMENT AND PLAN  TREATMENT PLAN FOR OBESITY:  Recommended Dietary Goals  Pam Mckee is currently in the action stage of change. As such, her goal is to continue weight management plan. She has agreed to the Category 3 Plan.  Behavioral Intervention  We discussed the following Behavioral Modification Strategies today: increasing lean protein intake, decreasing simple carbohydrates , increasing vegetables, increasing lower glycemic fruits, increasing water intake, reading food labels , keeping healthy foods at home, continue to practice mindfulness when eating, and planning for success.  Additional resources provided today: NA  Recommended Physical Activity Goals  Pam Mckee has been advised to work up to 150 minutes of moderate intensity aerobic activity a week and strengthening exercises 2-3 times per week for cardiovascular health, weight loss maintenance and preservation of muscle mass.   She has agreed to Continue current level of physical activity , Think about ways to increase daily physical activity and overcoming barriers to exercise, Increase physical activity in their day and reduce sedentary time (increase  NEAT)., and Work on scheduling and tracking physical activity.    ASSOCIATED CONDITIONS ADDRESSED TODAY  Action/Plan  Type 2 diabetes mellitus without complication, without long-term current use of insulin (HCC) -     Continue Tirzepatide; Inject 10 mg into the skin once a week.  Dispense: 2 mL; Refill: 0  Good blood sugar control is important to decrease the likelihood of diabetic complications such as nephropathy, neuropathy, limb loss, blindness, coronary artery disease, and death. Intensive lifestyle modification including diet, exercise and weight loss are the first line of treatment for diabetes.    Morbid  obesity (HCC)  BMI 40.0-44.9, adult (HCC)         Return in about 3 weeks (around 09/23/2023).Marland Kitchen She was informed of the importance of frequent follow up visits to maximize her success with intensive lifestyle modifications for her multiple health conditions.   ATTESTASTION STATEMENTS:  Reviewed by clinician on day of visit: allergies, medications, problem list, medical history, surgical history, family history, social history, and previous encounter notes.     Theodis Sato. Fard Borunda FNP-C

## 2023-09-07 ENCOUNTER — Other Ambulatory Visit: Payer: Self-pay

## 2023-09-08 ENCOUNTER — Other Ambulatory Visit: Payer: Self-pay

## 2023-09-14 ENCOUNTER — Other Ambulatory Visit: Payer: Self-pay

## 2023-09-15 ENCOUNTER — Ambulatory Visit: Payer: Self-pay | Admitting: Obstetrics and Gynecology

## 2023-09-16 ENCOUNTER — Other Ambulatory Visit: Payer: Self-pay

## 2023-09-17 ENCOUNTER — Encounter: Payer: Self-pay | Admitting: Obstetrics and Gynecology

## 2023-09-17 ENCOUNTER — Ambulatory Visit (INDEPENDENT_AMBULATORY_CARE_PROVIDER_SITE_OTHER): Payer: 59 | Admitting: Obstetrics and Gynecology

## 2023-09-17 VITALS — BP 118/76 | HR 91 | Ht 65.5 in | Wt 273.0 lb

## 2023-09-17 DIAGNOSIS — Z01419 Encounter for gynecological examination (general) (routine) without abnormal findings: Secondary | ICD-10-CM | POA: Diagnosis not present

## 2023-09-17 DIAGNOSIS — D219 Benign neoplasm of connective and other soft tissue, unspecified: Secondary | ICD-10-CM

## 2023-09-17 DIAGNOSIS — E042 Nontoxic multinodular goiter: Secondary | ICD-10-CM

## 2023-09-17 DIAGNOSIS — Z1231 Encounter for screening mammogram for malignant neoplasm of breast: Secondary | ICD-10-CM

## 2023-09-17 NOTE — Progress Notes (Signed)
52 y.o. y.o. female here for annual exam. She denies any heavy vaginal bleeding.   No LMP recorded. (Menstrual status: IUD).   2017 mirena IUD used for bleeding control with fibroids She is pleased with it.  She would like to have another one placed Body mass index is 44.74 kg/m.  She has lost 30lbs with wellness program at [facility] Maintenance: Pap:   08/04/2018 WNL NEG HPV,05/18/2015 WNL  History of abnormal Pap:  no MMG:  09/03/21 density B Bi-rads 1 neg  BMD:   n/a Colonoscopy: 04/05/21 polyps follow up 7 years  TDaP:  2013 Gardasil: n/a  Blood pressure 118/76, pulse 91, height 5' 5.5" (1.664 m), weight 273 lb (123.8 kg), SpO2 99%.     Component Value Date/Time   DIAGPAP  08/04/2018 0000    NEGATIVE FOR INTRAEPITHELIAL LESIONS OR MALIGNANCY.   ADEQPAP  08/04/2018 0000    Satisfactory for evaluation  endocervical/transformation zone component PRESENT.    GYN HISTORY:    Component Value Date/Time   DIAGPAP  08/04/2018 0000    NEGATIVE FOR INTRAEPITHELIAL LESIONS OR MALIGNANCY.   ADEQPAP  08/04/2018 0000    Satisfactory for evaluation  endocervical/transformation zone component PRESENT.    OB History  Gravida Para Term Preterm AB Living  2 1 1  0 1 1  SAB IAB Ectopic Multiple Live Births  0 1 0 0      # Outcome Date GA Lbr Len/2nd Weight Sex Type Anes PTL Lv  2 IAB           1 Term             Past Medical History:  Diagnosis Date   Anemia    Back pain    Elevated lipids    High cholesterol    Hypertension    Olecranon bursitis of right elbow 02/15/2015   Suspect this was an acute gout flare triggered by HCTZ since she had no trauma    OSA on CPAP    Prediabetes    Primary osteoarthritis of left knee 05/18/2015   Nonweightbearing x-rays revealed mild degenerative changes.  Referred to [facility]  - saw Lockie Pares , PA-C    Seizures [facility])    Thyroid disease    hypo   Torn LEFT medial meniscus 04/2015   Left knee MRI confirmed but no  instability sxs. Seen at [facility] 04/2016 to review treatment plan. sxs responded to cortisone injection summer 2016 and 04/2016 so continue prn. Consider arthroscopy if sxs become unresponsive.    Past Surgical History:  Procedure Laterality Date   COLONOSCOPY WITH PROPOFOL N/A 04/05/2021   Procedure: COLONOSCOPY WITH PROPOFOL;  Surgeon: Jeani Hawking, MD;  Location: [facility];  Service: Endoscopy;  Laterality: N/A;   INTRAUTERINE DEVICE (IUD) INSERTION  06/2016   POLYPECTOMY  04/05/2021   Procedure: POLYPECTOMY;  Surgeon: Jeani Hawking, MD;  Location: [facility];  Service: Endoscopy;;    Current Outpatient Medications on File Prior to Visit  Medication Sig Dispense Refill   hydrochlorothiazide (HYDRODIURIL) 50 MG tablet Take 1 tablet (50 mg total) by mouth daily. 90 tablet 1   levonorgestrel (MIRENA) 20 MCG/24HR IUD 1 each by Intrauterine route once.     levothyroxine (SYNTHROID) 112 MCG tablet Take 1 tablet (112 mcg total) by mouth daily on an empty stomach. No food or drink for 30 mins after taking medication. 90 tablet 0   losartan (COZAAR) 100 MG tablet Take 1 tablet (100 mg total) by mouth  daily. 90 tablet 1   rosuvastatin (CRESTOR) 10 MG tablet Take 1 tablet (10 mg total) by mouth daily. 90 tablet 0   tirzepatide (MOUNJARO) 10 MG/0.5ML Pen Inject 10 mg into the skin once a week. 2 mL 0   Vitamin D, Ergocalciferol, (DRISDOL) 1.25 MG (50000 UNIT) CAPS capsule Take 1 capsule (50,000 Units total) by mouth once a week. 5 capsule 0   nystatin cream (MYCOSTATIN) Apply 1 Application topically 2 (two) times daily for up to 7 days. (Patient not taking: Reported on 09/17/2023) 30 g 1   No current facility-administered medications on file prior to visit.    Social History   Socioeconomic History   Marital status: Married    Spouse name: Not on file   Number of children: 1   Years of education: HS   Highest education level: Not on file  Occupational History   Occupation: Primary school teacher    Employer: [employer]  Tobacco Use   Smoking status: Never   Smokeless tobacco: Never  Vaping Use   Vaping status: Never Used  Substance and Sexual Activity   Alcohol use: No    Alcohol/week: 0.0 standard drinks of alcohol   Drug use: No   Sexual activity: Yes    Partners: Male    Birth control/protection: I.U.D.  Other Topics Concern   Not on file  Social History Narrative      Drinks about 2-3 cups of coffee/tea a day. Education: [employer].   Social Determinants of Health   Financial Resource Strain: Not on file  Food Insecurity: Not on file  Transportation Needs: Not on file  Physical Activity: Not on file  Stress: Not on file  Social Connections: Not on file  Intimate Partner Violence: Not on file    Family History  Problem Relation Age of Onset   Hypertension Mother    CVA Mother        occured while on lovenox   Stroke Mother    Renal Disease Father    Kidney disease Father    Hypertension Brother    Aneurysm Brother    Diabetes Brother    Diabetes Paternal Grandfather    Breast cancer Neg Hx      No Known Allergies    Patient's last menstrual period was No LMP recorded. (Menstrual status: IUD)..            Review of Systems Alls systems reviewed and are negative.     Physical Exam Constitutional:      Appearance: Normal appearance.  Genitourinary:     Vulva and urethral meatus normal.     No lesions in the vagina.     Right Labia: No rash, lesions or skin changes.    Left Labia: No lesions, skin changes or rash.    No vaginal discharge or tenderness.     No vaginal prolapse present.    No vaginal atrophy present.     Right Adnexa: not tender, not palpable and no mass present.    Left Adnexa: not tender, not palpable and no mass present.    No cervical motion tenderness or discharge.     Uterus is enlarged and irregular.     Uterus is not tender.     Uterus is anteverted.  Breasts:    Right: Normal.     Left: Normal.   HENT:     Head: Normocephalic.  Neck:     Thyroid: Thyroid mass and thyromegaly present. No thyroid tenderness.  Cardiovascular:     Rate and Rhythm: Normal rate and regular rhythm.     Heart sounds: Normal heart sounds, S1 normal and S2 normal.  Pulmonary:     Effort: Pulmonary effort is normal.     Breath sounds: Normal breath sounds and air entry.  Abdominal:     General: There is no distension.     Palpations: Abdomen is soft. There is no mass.     Tenderness: There is no abdominal tenderness. There is no guarding or rebound.  Musculoskeletal:        General: Normal range of motion.     Cervical back: Full passive range of motion without pain, normal range of motion and neck supple. No tenderness.     Right lower leg: No edema.     Left lower leg: No edema.  Neurological:     Mental Status: She is alert.  Skin:    General: Skin is warm.  Psychiatric:        Mood and Affect: Mood normal.        Behavior: Behavior normal.        Thought Content: Thought content normal.  Vitals and nursing note reviewed. Exam conducted with a chaperone present.       A:         Well Woman GYN exam, fibroids, thyroid nodules                             P:        Pap smear collected today Encouraged annual mammogram screening Colon cancer screening up-to-date DXA not indicated Labs and immunizations to do with PMD Discussed breast self exams Encouraged healthy lifestyle practices Encouraged Vit D and Calcium  Discussed benefit to IUD in menopause with endometrial lining protection with her current body weight and risk for endometrial cancer and to help with hot flashes.  She would like to have this done in the upcoming year. No follow-ups on file.  Earley Favor

## 2023-09-21 ENCOUNTER — Other Ambulatory Visit (HOSPITAL_COMMUNITY)
Admission: RE | Admit: 2023-09-21 | Discharge: 2023-09-21 | Disposition: A | Discharge status: Home / Self Care | Payer: 59 | Source: Ambulatory Visit | Attending: Obstetrics and Gynecology | Admitting: Obstetrics and Gynecology

## 2023-09-21 DIAGNOSIS — Z01419 Encounter for gynecological examination (general) (routine) without abnormal findings: Secondary | ICD-10-CM | POA: Diagnosis not present

## 2023-09-21 NOTE — Addendum Note (Signed)
Addended by: Earley Favor on: 09/21/2023 05:29 PM   Modules accepted: Orders

## 2023-09-23 ENCOUNTER — Ambulatory Visit: Payer: 59 | Admitting: Nurse Practitioner

## 2023-09-23 ENCOUNTER — Encounter: Payer: Self-pay | Admitting: Nurse Practitioner

## 2023-09-23 ENCOUNTER — Other Ambulatory Visit: Payer: Self-pay

## 2023-09-23 VITALS — BP 138/84 | HR 75 | Temp 98.2°F | Ht 66.0 in | Wt 271.0 lb

## 2023-09-23 DIAGNOSIS — E119 Type 2 diabetes mellitus without complications: Secondary | ICD-10-CM | POA: Diagnosis not present

## 2023-09-23 DIAGNOSIS — Z6841 Body Mass Index (BMI) 40.0 and over, adult: Secondary | ICD-10-CM

## 2023-09-23 DIAGNOSIS — Z7985 Long-term (current) use of injectable non-insulin antidiabetic drugs: Secondary | ICD-10-CM

## 2023-09-23 LAB — CYTOLOGY - PAP
Comment: NEGATIVE
Diagnosis: NEGATIVE
High risk HPV: NEGATIVE

## 2023-09-23 MED ORDER — TIRZEPATIDE 12.5 MG/0.5ML ~~LOC~~ SOAJ
12.5000 mg | SUBCUTANEOUS | 0 refills | Status: DC
Start: 2023-09-23 — End: 2023-10-29
  Filled 2023-09-23 – 2023-10-09 (×5): qty 2, 28d supply, fill #0

## 2023-09-23 NOTE — Progress Notes (Signed)
Office: [phone]  /  Fax: [phone]  WEIGHT SUMMARY AND BIOMETRICS  Weight Lost Since Last Visit: 0lb  Weight Gained Since Last Visit: 2lb   Vitals Temp: 98.2 F (36.8 C) BP: 138/84 Pulse Rate: 75 SpO2: 98 %   Anthropometric Measurements Height: 5\' 6"  (1.676 m) Weight: 271 lb (122.9 kg) BMI (Calculated): 43.76 Weight at Last Visit: 269lb Weight Lost Since Last Visit: 0lb Weight Gained Since Last Visit: 2lb Starting Weight: 290lb Total Weight Loss (lbs): 19 lb (8.618 kg)   Body Composition  Body Fat %: 51.6 % Fat Mass (lbs): 140.2 lbs Muscle Mass (lbs): 124.8 lbs Total Body Water (lbs): 93.8 lbs Visceral Fat Rating : 17   Other Clinical Data Fasting: No Labs: No Today's Visit #: 8 Starting Date: 04/21/23     HPI  Chief Complaint: OBESITY  Viriginia is here to discuss her progress with her obesity treatment plan. She is on the the Category 3 Plan and states she is following her eating plan approximately 75 % of the time. She states she is exercising 10 minutes 5 days per week.   Interval History:  Since last office visit she has gained 2 pounds.  She is not skipping meals.  She is aiming to eat a protein with each meal and is meeting calories goals. She notes she has overall changed. She is making healthier choices and watching her portion sizes.  She has decreased her carbs intake. Her goal is to increase water intake.   Her highest weight was 298 lbs   Goal weight:  230 lbs to be able to go horse back riding.    Pharmacotherapy for weight loss: She is not currently taking medications  for medical weight loss.    Previous pharmacotherapy for medical weight loss:  Ozempic  Bariatric surgery:  Patient has not had bariatric surgery  Pharmacotherapy for DMT2:  She is currently taking Mounjaro 10mg .  Denies side effects.   Last A1c was 6.6 She is not checking BS at home.   Episodes of hypoglycemia: no On ACE or ARB, ASA 81mg  and statin.  Last  eye exam:  May 2024 She has tried Ozempic in the past.     Lab Results  Component Value Date   HGBA1C 6.6 (H) 04/21/2023   HGBA1C 6.4 (H) 07/17/2020   HGBA1C 5.9 (H) 06/23/2019   Lab Results  Component Value Date   LDLCALC 103 (H) 04/21/2023   CREATININE 0.76 04/21/2023     PHYSICAL EXAM:  Blood pressure 138/84, pulse 75, temperature 98.2 F (36.8 C), height 5\' 6"  (1.676 m), weight 271 lb (122.9 kg), SpO2 98%. Body mass index is 43.74 kg/m.  General: She is overweight, cooperative, alert, well developed, and in no acute distress. PSYCH: Has normal mood, affect and thought process.   Extremities: No edema.  Neurologic: No gross sensory or motor deficits. No tremors or fasciculations noted.    DIAGNOSTIC DATA REVIEWED:  BMET    Component Value Date/Time   NA 136 04/21/2023 0902   K 4.2 04/21/2023 0902   CL 97 04/21/2023 0902   CO2 25 04/21/2023 0902   GLUCOSE 107 (H) 04/21/2023 0902   GLUCOSE 86 09/09/2016 0837   BUN 15 04/21/2023 0902   CREATININE 0.76 04/21/2023 0902   CREATININE 0.84 09/09/2016 0837   CALCIUM 9.2 04/21/2023 0902   GFRNONAA 93 07/17/2020 0835   GFRNONAA >89 01/14/2016 1328   GFRAA 107 07/17/2020 0835   GFRAA >89 01/14/2016 1328   Lab Results  Component Value Date   HGBA1C 6.6 (H) 04/21/2023   HGBA1C 5.5 01/14/2016   Lab Results  Component Value Date   INSULIN 74.3 (H) 04/21/2023   Lab Results  Component Value Date   TSH 1.830 04/21/2023   CBC    Component Value Date/Time   WBC 9.0 03/08/2018 1158   WBC 11.2 (A) 11/24/2016 1311   WBC 7.6 11/19/2016 1337   RBC 4.63 03/08/2018 1158   RBC 5.22 11/24/2016 1311   RBC 4.47 11/19/2016 1337   HGB 11.7 03/08/2018 1158   HCT 37.4 03/08/2018 1158   PLT 353 03/08/2018 1158   MCV 81 03/08/2018 1158   MCH 25.3 (L) 03/08/2018 1158   MCH 25.2 (A) 11/24/2016 1311   MCH 24.4 (L) 11/19/2016 1337   MCHC 31.3 (L) 03/08/2018 1158   MCHC 34.0 11/24/2016 1311   MCHC 31.3 (L) 11/19/2016 1337    RDW 15.0 03/08/2018 1158   Iron Studies    Component Value Date/Time   IRON 30 (L) 07/12/2015 1826   TIBC 371 07/12/2015 1826   FERRITIN 82 03/08/2018 1158   IRONPCTSAT 8 (L) 07/12/2015 1826   Lipid Panel     Component Value Date/Time   CHOL 161 04/21/2023 0902   TRIG 92 04/21/2023 0902   HDL 41 04/21/2023 0902   CHOLHDL 5.3 (H) 07/17/2020 0835   CHOLHDL 4.3 09/09/2016 0825   VLDL 10 09/09/2016 0825   LDLCALC 103 (H) 04/21/2023 0902   Hepatic Function Panel     Component Value Date/Time   PROT 7.5 04/21/2023 0902   ALBUMIN 4.2 04/21/2023 0902   AST 18 04/21/2023 0902   ALT 15 04/21/2023 0902   ALKPHOS 109 04/21/2023 0902   BILITOT 0.3 04/21/2023 0902      Component Value Date/Time   TSH 1.830 04/21/2023 0902   Nutritional Lab Results  Component Value Date   VD25OH 20.4 (L) 04/21/2023   VD25OH 18.5 (L) 07/17/2020   VD25OH 33.4 06/23/2019     ASSESSMENT AND PLAN  TREATMENT PLAN FOR OBESITY:  Recommended Dietary Goals  Ashten is currently in the action stage of change. As such, her goal is to continue weight management plan. She has agreed to the Category 3 Plan.  Behavioral Intervention  We discussed the following Behavioral Modification Strategies today: continue to work on maintaining a reduced calorie state, getting the recommended amount of protein, incorporating whole foods, making healthy choices, staying well hydrated and practicing mindfulness when eating..  Additional resources provided today: NA  Recommended Physical Activity Goals  Margean has been advised to work up to 150 minutes of moderate intensity aerobic activity a week and strengthening exercises 2-3 times per week for cardiovascular health, weight loss maintenance and preservation of muscle mass.   She has agreed to Think about enjoyable ways to increase daily physical activity and overcoming barriers to exercise and Increase physical activity in their day and reduce sedentary time  (increase NEAT).    ASSOCIATED CONDITIONS ADDRESSED TODAY  Action/Plan  Type 2 diabetes mellitus without complication, without long-term current use of insulin (HCC) -     Continue Mounjaro 10mg  for the next 3 doses and then increase Tirzepatide; Inject 12.5 mg into the skin once a week.  Dispense: 2 mL; Refill: 0  Morbid obesity (HCC)  BMI 40.0-44.9, adult (HCC)     Will obtain labs at next visit    Return in about 4 weeks (around 10/21/2023).Marland Kitchen She was informed of the importance of frequent follow up visits to  maximize her success with intensive lifestyle modifications for her multiple health conditions.   ATTESTASTION STATEMENTS:  Reviewed by clinician on day of visit: allergies, medications, problem list, medical history, surgical history, family history, social history, and previous encounter notes.      Theodis Sato. Reina Wilton FNP-C

## 2023-09-25 ENCOUNTER — Ambulatory Visit
Admission: RE | Admit: 2023-09-25 | Discharge: 2023-09-25 | Disposition: A | Discharge status: Home / Self Care | Payer: 59 | Source: Ambulatory Visit | Attending: Obstetrics and Gynecology | Admitting: Obstetrics and Gynecology

## 2023-09-25 ENCOUNTER — Ambulatory Visit
Admission: RE | Admit: 2023-09-25 | Discharge: 2023-09-25 | Disposition: A | Discharge status: Home / Self Care | Payer: 59 | Source: Ambulatory Visit | Attending: Obstetrics and Gynecology

## 2023-09-25 DIAGNOSIS — D219 Benign neoplasm of connective and other soft tissue, unspecified: Secondary | ICD-10-CM

## 2023-09-25 DIAGNOSIS — D259 Leiomyoma of uterus, unspecified: Secondary | ICD-10-CM | POA: Diagnosis not present

## 2023-09-25 DIAGNOSIS — E041 Nontoxic single thyroid nodule: Secondary | ICD-10-CM | POA: Diagnosis not present

## 2023-09-25 DIAGNOSIS — Z975 Presence of (intrauterine) contraceptive device: Secondary | ICD-10-CM | POA: Diagnosis not present

## 2023-09-25 DIAGNOSIS — E042 Nontoxic multinodular goiter: Secondary | ICD-10-CM

## 2023-09-28 ENCOUNTER — Other Ambulatory Visit: Payer: Self-pay

## 2023-09-29 ENCOUNTER — Other Ambulatory Visit: Payer: Self-pay

## 2023-10-01 ENCOUNTER — Other Ambulatory Visit: Payer: Self-pay

## 2023-10-07 ENCOUNTER — Other Ambulatory Visit: Payer: Self-pay

## 2023-10-07 ENCOUNTER — Ambulatory Visit (HOSPITAL_BASED_OUTPATIENT_CLINIC_OR_DEPARTMENT_OTHER): Payer: 59 | Admitting: Family Medicine

## 2023-10-07 ENCOUNTER — Encounter (HOSPITAL_BASED_OUTPATIENT_CLINIC_OR_DEPARTMENT_OTHER): Payer: Self-pay | Admitting: Family Medicine

## 2023-10-07 VITALS — BP 144/86 | HR 78 | Ht 66.0 in | Wt 270.0 lb

## 2023-10-07 DIAGNOSIS — E1159 Type 2 diabetes mellitus with other circulatory complications: Secondary | ICD-10-CM | POA: Diagnosis not present

## 2023-10-07 DIAGNOSIS — E039 Hypothyroidism, unspecified: Secondary | ICD-10-CM

## 2023-10-07 DIAGNOSIS — B372 Candidiasis of skin and nail: Secondary | ICD-10-CM | POA: Diagnosis not present

## 2023-10-07 DIAGNOSIS — E038 Other specified hypothyroidism: Secondary | ICD-10-CM | POA: Diagnosis not present

## 2023-10-07 DIAGNOSIS — I152 Hypertension secondary to endocrine disorders: Secondary | ICD-10-CM

## 2023-10-07 MED ORDER — TRIAMCINOLONE ACETONIDE 0.1 % EX OINT
1.0000 | TOPICAL_OINTMENT | Freq: Two times a day (BID) | CUTANEOUS | 0 refills | Status: DC
  Filled 2023-10-07: qty 30, 15d supply, fill #0

## 2023-10-07 NOTE — Assessment & Plan Note (Addendum)
Patient presents today with slightly elevated blood pressure, recheck was about the same. She has not taken her blood pressure medication today. Patient in no acute distress and is well-appearing. Denies chest pain, shortness of breath, lower extremity edema, vision changes, headaches. Cardiovascular exam with heart regular rate and rhythm. Normal heart sounds, no murmurs present. No lower extremity edema present. Lungs clear to auscultation bilaterally. Patient is currently taking hydrochlorothiazide 50mg  daily & losartan 100mg  daily. No refills needed today. Since home blood pressure readings are well controlled, will not make any medication changes to her current regimen. Patient plans to continue to focus on healthy diet and daily exercise.

## 2023-10-07 NOTE — Patient Instructions (Signed)

## 2023-10-07 NOTE — Progress Notes (Signed)
Established Patient Office Visit  Subjective   Patient ID: Pam Mckee, female    DOB: Dec 13, 1970  Age: 52 y.o. MRN: [id number]  HYPERTENSION: Pam Mckee is a 52 year old female patient presents for the medical management of hypertension.  Patient's current hypertension medication regimen is: HCTZ 50mg  daily & losartan 100mg  daily  Patient is currently taking prescribed medications for HTN.  Patient is regularly keeping a check on BP at home. She reports her most recent SBP was in the 110s.  Adhering to low sodium diet: yes  Exercising Regularly: yes, she has lost almost 30lbs  Denies headache, dizziness, CP, SHOB, vision changes.    BP Readings from Last 3 Encounters:  10/07/23 (!) 144/86  09/23/23 138/84  09/17/23 118/76    Review of Systems  Constitutional:  Negative for malaise/fatigue.  Eyes:  Negative for blurred vision and double vision.  Respiratory:  Negative for cough and shortness of breath.   Cardiovascular:  Negative for chest pain, palpitations and leg swelling.  Gastrointestinal:  Negative for abdominal pain, nausea and vomiting.  Musculoskeletal:  Negative for myalgias.  Neurological:  Negative for dizziness, weakness and headaches.  Psychiatric/Behavioral:  Negative for depression and suicidal ideas. The patient is not nervous/anxious and does not have insomnia.       Objective:     BP (!) 144/86 Comment: Repeat BP, No meds this AM  Pulse 78   Ht 5\' 6"  (1.676 m)   Wt 270 lb (122.5 kg)   LMP  (LMP Unknown)   SpO2 99%   BMI 43.58 kg/m  BP Readings from Last 3 Encounters:  10/07/23 (!) 144/86  09/23/23 138/84  09/17/23 118/76     Physical Exam Vitals reviewed.  Constitutional:      Appearance: Normal appearance.  Cardiovascular:     Rate and Rhythm: Normal rate and regular rhythm.     Pulses: Normal pulses.     Heart sounds: Normal heart sounds.  Pulmonary:     Effort: Pulmonary effort is normal.     Breath sounds: Normal breath sounds.   Musculoskeletal:     Right lower leg: No edema.     Left lower leg: No edema.  Neurological:     Mental Status: She is alert.  Psychiatric:        Mood and Affect: Mood normal.        Behavior: Behavior normal.       Assessment & Plan:  Hypertension associated with type 2 diabetes mellitus (HCC) Assessment & Plan: Patient presents today with slightly elevated blood pressure, recheck was about the same. She has not taken her blood pressure medication today. Patient in no acute distress and is well-appearing. Denies chest pain, shortness of breath, lower extremity edema, vision changes, headaches. Cardiovascular exam with heart regular rate and rhythm. Normal heart sounds, no murmurs present. No lower extremity edema present. Lungs clear to auscultation bilaterally. Patient is currently taking hydrochlorothiazide 50mg  daily & losartan 100mg  daily. No refills needed today. Since home blood pressure readings are well controlled, will not make any medication changes to her current regimen. Patient plans to continue to focus on healthy diet and daily exercise.    Hypothyroidism, unspecified type Assessment & Plan: Patient would like to repeat her thyroid function today. Review of OBGYN note from 09/17/2023 with physical exam remarkable for thyromegaly. Thyroid US completed 10/18 with nodule biopsied in 2016 has decreased in size and no other thyroid nodules present. US shows chronic thyroiditis. Thyroid function completed  04/21/2023. Will repeat thryoid levels today to ensure she is still on adequate dose of levothyroxine.   Orders: -     Thyroid Function Panel (THS+T3+T4+Free)  Candidal intertrigo -     Triamcinolone Acetonide; Apply 1 Application topically 2 (two) times daily.  Dispense: 30 g; Refill: 0     Return in about 4 months (around 02/05/2024) for HTN follow-up.    Alyson Reedy, FNP

## 2023-10-07 NOTE — Assessment & Plan Note (Signed)
Patient would like to repeat her thyroid function today. Review of OBGYN note from 09/17/2023 with physical exam remarkable for thyromegaly. Thyroid US completed 10/18 with nodule biopsied in 2016 has decreased in size and no other thyroid nodules present. US shows chronic thyroiditis. Thyroid function completed 04/21/2023. Will repeat thryoid levels today to ensure she is still on adequate dose of levothyroxine.

## 2023-10-09 ENCOUNTER — Other Ambulatory Visit: Payer: Self-pay

## 2023-10-12 ENCOUNTER — Other Ambulatory Visit: Payer: Self-pay | Admitting: Nurse Practitioner

## 2023-10-12 DIAGNOSIS — E559 Vitamin D deficiency, unspecified: Secondary | ICD-10-CM

## 2023-10-13 ENCOUNTER — Other Ambulatory Visit: Payer: Self-pay

## 2023-10-13 ENCOUNTER — Ambulatory Visit
Admission: RE | Admit: 2023-10-13 | Discharge: 2023-10-13 | Disposition: A | Discharge status: Home / Self Care | Payer: 59 | Source: Ambulatory Visit | Attending: Obstetrics and Gynecology | Admitting: Obstetrics and Gynecology

## 2023-10-13 DIAGNOSIS — Z1231 Encounter for screening mammogram for malignant neoplasm of breast: Secondary | ICD-10-CM

## 2023-10-19 ENCOUNTER — Ambulatory Visit: Payer: 59 | Admitting: Obstetrics and Gynecology

## 2023-10-19 ENCOUNTER — Other Ambulatory Visit: Payer: Self-pay

## 2023-10-19 LAB — TSH+T3+FREE T4+T3 FREE
Free T-3: 2.9 pg/mL
Free T4 by Dialysis: 1 ng/dL
TSH: 8.9 uU/mL — ABNORMAL HIGH
Triiodothyronine (T-3), Serum: 112 ng/dL

## 2023-10-22 ENCOUNTER — Telehealth: Payer: Self-pay

## 2023-10-22 NOTE — Telephone Encounter (Signed)
Vicente Serene from [facility] LVM in triage line re: pt's pelvic US results and requested a CB.  I spoke w/ Kennyth Arnold and she just wanted to confirm that the results were received w/ Lt Ovarian Mass and that malignancy cannot be ruled out. I confirmed that results are able to be reviewed and will notify provider ASAP.  She voiced understanding and appreciation for cb.

## 2023-10-26 ENCOUNTER — Ambulatory Visit: Payer: 59 | Admitting: Obstetrics and Gynecology

## 2023-10-26 ENCOUNTER — Encounter: Payer: Self-pay | Admitting: Obstetrics and Gynecology

## 2023-10-26 VITALS — BP 118/80 | HR 77

## 2023-10-26 DIAGNOSIS — N838 Other noninflammatory disorders of ovary, fallopian tube and broad ligament: Secondary | ICD-10-CM

## 2023-10-26 DIAGNOSIS — D219 Benign neoplasm of connective and other soft tissue, unspecified: Secondary | ICD-10-CM

## 2023-10-27 ENCOUNTER — Encounter: Payer: Self-pay | Admitting: Obstetrics and Gynecology

## 2023-10-27 NOTE — Progress Notes (Unsigned)
52 y.o. y.o. female here for TV US results with left ovarian mass.  She denies any heavy vaginal bleeding.   She has had the mirena IUD since 2017. 06/17/16 Dr. Oscar La TV US with multiple fibroids, no adnexal masses identified  Narrative & Impression  CLINICAL DATA:  Fibroids, IUD.   EXAM: TRANSABDOMINAL AND TRANSVAGINAL ULTRASOUND OF PELVIS   TECHNIQUE: Both transabdominal and transvaginal ultrasound examinations of the pelvis were performed. Transabdominal technique was performed for global imaging of the pelvis including uterus, ovaries, adnexal regions, and pelvic cul-de-sac. It was necessary to proceed with endovaginal exam following the transabdominal exam to visualize the uterus, endometrium, ovaries and adnexal regions.   COMPARISON:  06/17/2016.   FINDINGS: Uterus   Measurements: 8.6 x 6.1 x 9.2 cm = volume: 253 mL. Intrauterine contraceptive device is in appropriate position. Multiple hypoechoic masses in the uterus, measuring up to 4.5 x 3.3 x 3.4 cm.   Endometrium   Thickness: 5 mm, within normal limits for a perimenopausal female in the absence of abnormal uterine bleeding. No focal abnormality visualized.   Right ovary   Not visualized.   Left ovary   Measurements: 4.7 x 3.8 x 4.0 cm = volume: 38 mL. Image quality is limited by discomfort due to vaginal probe and body habitus. There is a 3.4 x 3.0 x 3.0 cm hypoechoic mass or anechoic mass with homogeneous low level internal echoes. Associated increased through transmission.   Other findings   Trace pelvic free fluid.   IMPRESSION: 1. Difficult exam due to retroverted uterus, uterine fibroids, body habitus and discomfort. 2. Left ovarian hypoechoic mass versus anechoic mass with homogeneous low level internal echoes. Associated increased through transmission. Initial follow-up pelvic ultrasound in 6-12 weeks is recommended as malignancy cannot be excluded. These results will be called to the  ordering clinician or representative by the Radiologist Assistant, and communication documented in the PACS or [employer]. 3. Uterine fibroids. 4. Intrauterine contraceptive device in appropriate position.     Electronically Signed   By: Leanna Battles M.D.   On: 10/22/2023 08:56    She has lost 30lbs with wellness program at [facility] Maintenance: Pap:   08/04/2018 WNL NEG HPV,05/18/2015 WNL  History of abnormal Pap:  no MMG:  09/03/21 density B Bi-rads 1 neg  BMD:   n/a Colonoscopy: 04/05/21 polyps follow up 7 years  TDaP:  2013 Gardasil: n/a  Blood pressure 118/80, pulse 77, SpO2 98%.     Component Value Date/Time   DIAGPAP  09/21/2023 1729    - Negative for intraepithelial lesion or malignancy (NILM)   DIAGPAP  08/04/2018 0000    NEGATIVE FOR INTRAEPITHELIAL LESIONS OR MALIGNANCY.   HPVHIGH Negative 09/21/2023 1729   ADEQPAP  09/21/2023 1729    Satisfactory for evaluation; transformation zone component PRESENT.   ADEQPAP  08/04/2018 0000    Satisfactory for evaluation  endocervical/transformation zone component PRESENT.    GYN HISTORY:    Component Value Date/Time   DIAGPAP  09/21/2023 1729    - Negative for intraepithelial lesion or malignancy (NILM)   DIAGPAP  08/04/2018 0000    NEGATIVE FOR INTRAEPITHELIAL LESIONS OR MALIGNANCY.   HPVHIGH Negative 09/21/2023 1729   ADEQPAP  09/21/2023 1729    Satisfactory for evaluation; transformation zone component PRESENT.   ADEQPAP  08/04/2018 0000    Satisfactory for evaluation  endocervical/transformation zone component PRESENT.    OB History  Gravida Para Term Preterm AB Living  2 1 1  0 1 1  SAB IAB Ectopic Multiple Live Births  0 1 0 0      # Outcome Date GA Lbr Len/2nd Weight Sex Type Anes PTL Lv  2 IAB           1 Term             Past Medical History:  Diagnosis Date   Anemia    Back pain    Elevated lipids    High cholesterol    Hypertension    Olecranon bursitis of right elbow  02/15/2015   Suspect this was an acute gout flare triggered by HCTZ since she had no trauma    OSA on CPAP    Prediabetes    Primary osteoarthritis of left knee 05/18/2015   Nonweightbearing x-rays revealed mild degenerative changes.  Referred to [facility]  - saw Lockie Pares , PA-C    Seizures [facility])    Thyroid disease    hypo   Torn LEFT medial meniscus 04/2015   Left knee MRI confirmed but no instability sxs. Seen at [facility] 04/2016 to review treatment plan. sxs responded to cortisone injection summer 2016 and 04/2016 so continue prn. Consider arthroscopy if sxs become unresponsive.    Past Surgical History:  Procedure Laterality Date   COLONOSCOPY WITH PROPOFOL N/A 04/05/2021   Procedure: COLONOSCOPY WITH PROPOFOL;  Surgeon: Jeani Hawking, MD;  Location: [facility];  Service: Endoscopy;  Laterality: N/A;   INTRAUTERINE DEVICE (IUD) INSERTION  06/2016   POLYPECTOMY  04/05/2021   Procedure: POLYPECTOMY;  Surgeon: Jeani Hawking, MD;  Location: [facility];  Service: Endoscopy;;    Current Outpatient Medications on File Prior to Visit  Medication Sig Dispense Refill   hydrochlorothiazide (HYDRODIURIL) 50 MG tablet Take 1 tablet (50 mg total) by mouth daily. 90 tablet 1   levonorgestrel (MIRENA) 20 MCG/24HR IUD 1 each by Intrauterine route once.     levothyroxine (SYNTHROID) 112 MCG tablet Take 1 tablet (112 mcg total) by mouth daily on an empty stomach. No food or drink for 30 mins after taking medication. 90 tablet 0   losartan (COZAAR) 100 MG tablet Take 1 tablet (100 mg total) by mouth daily. 90 tablet 1   rosuvastatin (CRESTOR) 10 MG tablet Take 1 tablet (10 mg total) by mouth daily. 90 tablet 0   tirzepatide (MOUNJARO) 12.5 MG/0.5ML Pen Inject 12.5 mg into the skin once a week. 2 mL 0   triamcinolone ointment (KENALOG) 0.1 % Apply 1 Application topically 2 (two) times daily. 30 g 0   Vitamin D, Ergocalciferol, (DRISDOL) 1.25 MG (50000 UNIT) CAPS capsule Take 1 capsule  (50,000 Units total) by mouth once a week. 5 capsule 0   No current facility-administered medications on file prior to visit.    Social History   Socioeconomic History   Marital status: Married    Spouse name: Not on file   Number of children: 1   Years of education: HS   Highest education level: 12th grade  Occupational History   Occupation: Arboriculturist: [employer]  Tobacco Use   Smoking status: Never   Smokeless tobacco: Never  Vaping Use   Vaping status: Never Used  Substance and Sexual Activity   Alcohol use: No    Alcohol/week: 0.0 standard drinks of alcohol   Drug use: No   Sexual activity: Yes    Partners: Male    Birth control/protection: I.U.D.  Other Topics Concern   Not on file  Social History Narrative      Drinks about 2-3 cups of coffee/tea a day. Education: [employer].   Social Determinants of Health   Financial Resource Strain: Medium Risk (10/06/2023)   Overall Financial Resource Strain (CARDIA)    Difficulty of Paying Living Expenses: Somewhat hard  Food Insecurity: Food Insecurity Present (10/06/2023)   Hunger Vital Sign    Worried About Running Out of Food in the Last Year: Sometimes true    Ran Out of Food in the Last Year: Never true  Transportation Needs: No Transportation Needs (10/06/2023)   PRAPARE - Administrator, Civil Service (Medical): No    Lack of Transportation (Non-Medical): No  Physical Activity: Unknown (10/06/2023)   Exercise Vital Sign    Days of Exercise per Week: 0 days    Minutes of Exercise per Session: Not on file  Stress: No Stress Concern Present (10/06/2023)   [employer] of Occupational Health - Occupational Stress Questionnaire    Feeling of Stress : Not at all  Social Connections: Socially Isolated (10/06/2023)   Social Connection and Isolation Panel [NHANES]    Frequency of Communication with Friends and Family: Once a week    Frequency of Social Gatherings with Friends  and Family: Never    Attends Religious Services: Never    Database administrator or Organizations: No    Attends Engineer, structural: Not on file    Marital Status: Married  Catering manager Violence: Not on file    Family History  Problem Relation Age of Onset   Hypertension Mother    CVA Mother        occured while on lovenox   Stroke Mother    Renal Disease Father    Kidney disease Father    Hypertension Brother    Aneurysm Brother    Diabetes Brother    Diabetes Paternal Grandfather    Breast cancer Neg Hx      No Known Allergies    Patient's last menstrual period was No LMP recorded. (Menstrual status: IUD)..            Review of Systems Alls systems reviewed and are negative.       A:         discuss [place] results, fibroids,ovarian mass                             P:       Counseled on ultrasound findings with new ovarian mass.  Counseled on potential differential diagnosis of malignancy, dermoid, complex cyst.  She is unaware of any family history of any GYN cancers.  We discussed pathology ultimately is needed to determine if mass is benign or not.  To obtain an OVA1 blood test today.  We discussed if this is elevated she may need surgery with the GYN oncologist.  If in normal range, may proceed with standard robotic laparoscopic hysterectomy with either BSO or just LSO.  Torsion symptoms were discussed with patient and when to return.  Currently patient does not have any symptoms of this.  Patient voiced understanding.  No follow-ups on file. 30 minutes spent on reviewing records, imaging,  and one on one patient time and counseling patient and documentation Dr. Judith Blonder

## 2023-10-29 ENCOUNTER — Encounter (HOSPITAL_BASED_OUTPATIENT_CLINIC_OR_DEPARTMENT_OTHER): Payer: Self-pay | Admitting: Family Medicine

## 2023-10-29 ENCOUNTER — Other Ambulatory Visit: Payer: Self-pay

## 2023-10-29 ENCOUNTER — Encounter: Payer: Self-pay | Admitting: Nurse Practitioner

## 2023-10-29 ENCOUNTER — Ambulatory Visit: Payer: 59 | Admitting: Nurse Practitioner

## 2023-10-29 VITALS — BP 131/83 | HR 84 | Temp 98.0°F | Ht 66.0 in | Wt 264.0 lb

## 2023-10-29 DIAGNOSIS — E119 Type 2 diabetes mellitus without complications: Secondary | ICD-10-CM

## 2023-10-29 DIAGNOSIS — E785 Hyperlipidemia, unspecified: Secondary | ICD-10-CM

## 2023-10-29 DIAGNOSIS — E1169 Type 2 diabetes mellitus with other specified complication: Secondary | ICD-10-CM

## 2023-10-29 DIAGNOSIS — Z79899 Other long term (current) drug therapy: Secondary | ICD-10-CM | POA: Diagnosis not present

## 2023-10-29 DIAGNOSIS — Z7985 Long-term (current) use of injectable non-insulin antidiabetic drugs: Secondary | ICD-10-CM | POA: Diagnosis not present

## 2023-10-29 DIAGNOSIS — Z6841 Body Mass Index (BMI) 40.0 and over, adult: Secondary | ICD-10-CM | POA: Diagnosis not present

## 2023-10-29 DIAGNOSIS — E559 Vitamin D deficiency, unspecified: Secondary | ICD-10-CM | POA: Diagnosis not present

## 2023-10-29 MED ORDER — TIRZEPATIDE 12.5 MG/0.5ML ~~LOC~~ SOAJ
12.5000 mg | SUBCUTANEOUS | 0 refills | Status: DC
  Filled 2023-10-29 – 2023-11-06 (×2): qty 2, 28d supply, fill #0

## 2023-10-29 MED ORDER — VITAMIN D (ERGOCALCIFEROL) 1.25 MG (50000 UNIT) PO CAPS
50000.0000 [IU] | ORAL_CAPSULE | ORAL | 0 refills | Status: DC
  Filled 2023-10-29 – 2023-11-06 (×2): qty 5, 35d supply, fill #0
  Filled 2023-11-09: qty 4, 28d supply, fill #0

## 2023-10-29 NOTE — Progress Notes (Signed)
Office: [phone]  /  Fax: [phone]  WEIGHT SUMMARY AND BIOMETRICS  Weight Lost Since Last Visit: 7lb  Weight Gained Since Last Visit: 0lb   Vitals Temp: 98 F (36.7 C) BP: 131/83 Pulse Rate: 84 SpO2: 98 %   Anthropometric Measurements Height: 5\' 6"  (1.676 m) Weight: 264 lb (119.7 kg) BMI (Calculated): 42.63 Weight at Last Visit: 271lb Weight Lost Since Last Visit: 7lb Weight Gained Since Last Visit: 0lb Starting Weight: 290lb Total Weight Loss (lbs): 26 lb (11.8 kg)   Body Composition  Body Fat %: 48.9 % Fat Mass (lbs): 129 lbs Muscle Mass (lbs): 128.2 lbs Total Body Water (lbs): 87.8 lbs Visceral Fat Rating : 15   Other Clinical Data Fasting: Yes Labs: Yes Today's Visit #: 9 Starting Date: 04/21/23     HPI  Chief Complaint: OBESITY  Pam Mckee is here to discuss her progress with her obesity treatment plan. She is on the the Category 3 Plan and states she is following her eating plan approximately 50 % of the time. She states she is exercising 10-15 minutes 5 days per week.   Interval History:  Since last office visit she has lost 7 pounds.  She is not skipping meals, aiming to eat a protein with each meal and drinking water daily.   She is going to [place] in December.     Her highest weight was 298 lbs    Goal weight:  230 lbs to be able to go horse back riding.      Pharmacotherapy for weight loss: She is not currently taking medications  for medical weight loss.     Previous pharmacotherapy for medical weight loss:  Ozempic   Bariatric surgery:  Patient has not had bariatric surgery  Pharmacotherapy for DMT2:  She is currently taking Mounjaro 12.5 mg.  Denies side effects.   Last A1c was 6.6 She is not checking BS at home.   Episodes of hypoglycemia: no On ARB and statin.  Last eye exam:  May 2024 She has tried Ozempic in the past.    Lab Results  Component Value Date   HGBA1C 6.6 (H) 04/21/2023   HGBA1C 6.4 (H) 07/17/2020    HGBA1C 5.9 (H) 06/23/2019   Lab Results  Component Value Date   LDLCALC 103 (H) 04/21/2023   CREATININE 0.76 04/21/2023    Vit D deficiency  She is taking Vit D 50,000 IU weekly.  Denies side effects.  Denies nausea, vomiting or muscle weakness.    Lab Results  Component Value Date   VD25OH 20.4 (L) 04/21/2023   VD25OH 18.5 (L) 07/17/2020   VD25OH 33.4 06/23/2019    Hyperlipidemia Medication(s): Crestor 10mg . Denies side effects.    Lab Results  Component Value Date   CHOL 161 04/21/2023   HDL 41 04/21/2023   LDLCALC 103 (H) 04/21/2023   TRIG 92 04/21/2023   CHOLHDL 5.3 (H) 07/17/2020   Lab Results  Component Value Date   ALT 15 04/21/2023   AST 18 04/21/2023   ALKPHOS 109 04/21/2023   BILITOT 0.3 04/21/2023   The 10-year ASCVD risk score (Arnett DK, et al., 2019) is: 11.3%   Values used to calculate the score:     Age: 52 years     Sex: Female     Is Non-Hispanic African American: Yes     Diabetic: Yes     Tobacco smoker: No     Systolic Blood Pressure: 131 mmHg     Is BP treated:  Yes     HDL Cholesterol: 41 mg/dL     Total Cholesterol: 161 mg/dL  PHYSICAL EXAM:  Blood pressure 131/83, pulse 84, temperature 98 F (36.7 C), height 5\' 6"  (1.676 m), weight 264 lb (119.7 kg), SpO2 98%. Body mass index is 42.61 kg/m.  General: She is overweight, cooperative, alert, well developed, and in no acute distress. PSYCH: Has normal mood, affect and thought process.   Extremities: No edema.  Neurologic: No gross sensory or motor deficits. No tremors or fasciculations noted.    DIAGNOSTIC DATA REVIEWED:  BMET    Component Value Date/Time   NA 136 04/21/2023 0902   K 4.2 04/21/2023 0902   CL 97 04/21/2023 0902   CO2 25 04/21/2023 0902   GLUCOSE 107 (H) 04/21/2023 0902   GLUCOSE 86 09/09/2016 0837   BUN 15 04/21/2023 0902   CREATININE 0.76 04/21/2023 0902   CREATININE 0.84 09/09/2016 0837   CALCIUM 9.2 04/21/2023 0902   GFRNONAA 93 07/17/2020 0835    GFRNONAA >89 01/14/2016 1328   GFRAA 107 07/17/2020 0835   GFRAA >89 01/14/2016 1328   Lab Results  Component Value Date   HGBA1C 6.6 (H) 04/21/2023   HGBA1C 5.5 01/14/2016   Lab Results  Component Value Date   INSULIN 74.3 (H) 04/21/2023   Lab Results  Component Value Date   TSH 1.830 04/21/2023   CBC    Component Value Date/Time   WBC 9.0 03/08/2018 1158   WBC 11.2 (A) 11/24/2016 1311   WBC 7.6 11/19/2016 1337   RBC 4.63 03/08/2018 1158   RBC 5.22 11/24/2016 1311   RBC 4.47 11/19/2016 1337   HGB 11.7 03/08/2018 1158   HCT 37.4 03/08/2018 1158   PLT 353 03/08/2018 1158   MCV 81 03/08/2018 1158   MCH 25.3 (L) 03/08/2018 1158   MCH 25.2 (A) 11/24/2016 1311   MCH 24.4 (L) 11/19/2016 1337   MCHC 31.3 (L) 03/08/2018 1158   MCHC 34.0 11/24/2016 1311   MCHC 31.3 (L) 11/19/2016 1337   RDW 15.0 03/08/2018 1158   Iron Studies    Component Value Date/Time   IRON 30 (L) 07/12/2015 1826   TIBC 371 07/12/2015 1826   FERRITIN 82 03/08/2018 1158   IRONPCTSAT 8 (L) 07/12/2015 1826   Lipid Panel     Component Value Date/Time   CHOL 161 04/21/2023 0902   TRIG 92 04/21/2023 0902   HDL 41 04/21/2023 0902   CHOLHDL 5.3 (H) 07/17/2020 0835   CHOLHDL 4.3 09/09/2016 0825   VLDL 10 09/09/2016 0825   LDLCALC 103 (H) 04/21/2023 0902   Hepatic Function Panel     Component Value Date/Time   PROT 7.5 04/21/2023 0902   ALBUMIN 4.2 04/21/2023 0902   AST 18 04/21/2023 0902   ALT 15 04/21/2023 0902   ALKPHOS 109 04/21/2023 0902   BILITOT 0.3 04/21/2023 0902      Component Value Date/Time   TSH 1.830 04/21/2023 0902   Nutritional Lab Results  Component Value Date   VD25OH 20.4 (L) 04/21/2023   VD25OH 18.5 (L) 07/17/2020   VD25OH 33.4 06/23/2019     ASSESSMENT AND PLAN  TREATMENT PLAN FOR OBESITY:  Recommended Dietary Goals  Pam Mckee is currently in the action stage of change. As such, her goal is to continue weight management plan. She has agreed to the Category 3  Plan.  Behavioral Intervention  We discussed the following Behavioral Modification Strategies today: continue to work on maintaining a reduced calorie state, getting the recommended  amount of protein, incorporating whole foods, making healthy choices, staying well hydrated and practicing mindfulness when eating..  Additional resources provided today: NA  Recommended Physical Activity Goals  Koey has been advised to work up to 150 minutes of moderate intensity aerobic activity a week and strengthening exercises 2-3 times per week for cardiovascular health, weight loss maintenance and preservation of muscle mass.   She has agreed to Think about enjoyable ways to increase daily physical activity and overcoming barriers to exercise, Increase physical activity in their day and reduce sedentary time (increase NEAT)., Increase the intensity, frequency or duration of strengthening exercises , and Increase the intensity, frequency or duration of aerobic exercises       ASSOCIATED CONDITIONS ADDRESSED TODAY  Action/Plan  Type 2 diabetes mellitus without complication, without long-term current use of insulin (HCC) -     Continue Tirzepatide; Inject 12.5 mg into the skin once a week.  Dispense: 2 mL; Refill: 0.  Side effects discussed -     Hemoglobin A1c  Hyperlipidemia associated with type 2 diabetes mellitus (HCC) -     Lipid Panel With LDL/HDL Ratio  Continue to follow up with PCP.  Continue med as directed.   Vitamin D deficiency -     Vitamin D (Ergocalciferol); Take 1 capsule (50,000 Units total) by mouth once a week.  Dispense: 5 capsule; Refill: 0 -     VITAMIN D 25 Hydroxy (Vit-D Deficiency, Fractures)  Medication management -     Comprehensive metabolic panel  Morbid obesity (HCC)  BMI 40.0-44.9, adult (HCC)         Return in about 4 weeks (around 11/26/2023).Marland Kitchen She was informed of the importance of frequent follow up visits to maximize her success with intensive  lifestyle modifications for her multiple health conditions.   ATTESTASTION STATEMENTS:  Reviewed by clinician on day of visit: allergies, medications, problem list, medical history, surgical history, family history, social history, and previous encounter notes.     Theodis Sato. Maikol Grassia FNP-C

## 2023-10-30 LAB — LIPID PANEL WITH LDL/HDL RATIO
Cholesterol, Total: 140 mg/dL (ref 100–199)
HDL: 42 mg/dL (ref >39)
LDL Chol Calc (NIH): 80 mg/dL (ref 0–99)
LDL/HDL Ratio: 1.9 ratio (ref 0.0–3.2)
Triglycerides: 95 mg/dL (ref 0–149)
VLDL Cholesterol Cal: 18 mg/dL (ref 5–40)

## 2023-10-30 LAB — COMPREHENSIVE METABOLIC PANEL
ALT: 12 [IU]/L (ref 0–32)
AST: 14 [IU]/L (ref 0–40)
Albumin: 4 g/dL (ref 3.8–4.9)
Alkaline Phosphatase: 96 [IU]/L (ref 44–121)
BUN/Creatinine Ratio: 20 (ref 9–23)
BUN: 17 mg/dL (ref 6–24)
Bilirubin Total: 0.4 mg/dL (ref 0.0–1.2)
CO2: 27 mmol/L (ref 20–29)
Calcium: 9.2 mg/dL (ref 8.7–10.2)
Chloride: 97 mmol/L (ref 96–106)
Creatinine, Ser: 0.83 mg/dL (ref 0.57–1.00)
Globulin, Total: 3.2 g/dL (ref 1.5–4.5)
Glucose: 90 mg/dL (ref 70–99)
Potassium: 3.4 mmol/L — ABNORMAL LOW (ref 3.5–5.2)
Sodium: 138 mmol/L (ref 134–144)
Total Protein: 7.2 g/dL (ref 6.0–8.5)
eGFR: 85 mL/min/{1.73_m2} (ref >59)

## 2023-10-30 LAB — VITAMIN D 25 HYDROXY (VIT D DEFICIENCY, FRACTURES): Vit D, 25-Hydroxy: 59.5 ng/mL (ref 30.0–100.0)

## 2023-10-30 LAB — HEMOGLOBIN A1C
Est. average glucose Bld gHb Est-mCnc: 128 mg/dL
Hgb A1c MFr Bld: 6.1 % — ABNORMAL HIGH (ref 4.8–5.6)

## 2023-10-30 NOTE — Telephone Encounter (Signed)
Please refer to pelvic US result notes for documentation on conversation with pt.   Pt was seen on 10/26/2023. Encounter closed.

## 2023-10-31 LAB — OVARIAN MALIGNANCY RISK-ROMA
CA125: 20 U/mL (ref <35)
HE4, OVARIAN CANCER MONITORING: 39 pmol/L
ROMA Postmenopausal: 1.09 (ref <2.77)
ROMA Premenopausal: 0.43 (ref <1.31)

## 2023-11-02 ENCOUNTER — Encounter: Payer: Self-pay | Admitting: Nurse Practitioner

## 2023-11-03 ENCOUNTER — Encounter: Payer: Self-pay | Admitting: Obstetrics and Gynecology

## 2023-11-06 ENCOUNTER — Other Ambulatory Visit: Payer: Self-pay

## 2023-11-07 ENCOUNTER — Other Ambulatory Visit (HOSPITAL_BASED_OUTPATIENT_CLINIC_OR_DEPARTMENT_OTHER): Payer: Self-pay | Admitting: Family Medicine

## 2023-11-07 DIAGNOSIS — I152 Hypertension secondary to endocrine disorders: Secondary | ICD-10-CM

## 2023-11-09 ENCOUNTER — Other Ambulatory Visit: Payer: Self-pay

## 2023-11-09 MED ORDER — HYDROCHLOROTHIAZIDE 50 MG PO TABS
50.0000 mg | ORAL_TABLET | Freq: Every day | ORAL | 1 refills | Status: DC
Start: 2023-11-09 — End: 2024-03-08
  Filled 2023-11-09 – 2024-01-15 (×2): qty 90, 90d supply, fill #0

## 2023-11-09 MED ORDER — ROSUVASTATIN CALCIUM 10 MG PO TABS
10.0000 mg | ORAL_TABLET | Freq: Every day | ORAL | 0 refills | Status: DC
  Filled 2023-11-09: qty 90, 90d supply, fill #0

## 2023-11-11 ENCOUNTER — Other Ambulatory Visit: Payer: Self-pay

## 2023-11-20 NOTE — Telephone Encounter (Signed)
Per EB:  "So I would prefer to take out at the time of surgery, if she does a hysterectomy.  If she does not have a hysterectomy, we can schedule for removal and placement of new one.  Dr. Karma Greaser"

## 2023-11-25 ENCOUNTER — Encounter: Payer: Self-pay | Admitting: Nurse Practitioner

## 2023-11-25 ENCOUNTER — Ambulatory Visit: Payer: 59 | Admitting: Nurse Practitioner

## 2023-11-25 ENCOUNTER — Other Ambulatory Visit: Payer: Self-pay

## 2023-11-25 VITALS — BP 141/87 | HR 80 | Temp 97.7°F | Ht 66.0 in | Wt 260.0 lb

## 2023-11-25 DIAGNOSIS — E785 Hyperlipidemia, unspecified: Secondary | ICD-10-CM

## 2023-11-25 DIAGNOSIS — Z6841 Body Mass Index (BMI) 40.0 and over, adult: Secondary | ICD-10-CM | POA: Diagnosis not present

## 2023-11-25 DIAGNOSIS — E559 Vitamin D deficiency, unspecified: Secondary | ICD-10-CM | POA: Diagnosis not present

## 2023-11-25 DIAGNOSIS — Z7985 Long-term (current) use of injectable non-insulin antidiabetic drugs: Secondary | ICD-10-CM

## 2023-11-25 DIAGNOSIS — E1159 Type 2 diabetes mellitus with other circulatory complications: Secondary | ICD-10-CM | POA: Diagnosis not present

## 2023-11-25 DIAGNOSIS — I152 Hypertension secondary to endocrine disorders: Secondary | ICD-10-CM | POA: Diagnosis not present

## 2023-11-25 DIAGNOSIS — E119 Type 2 diabetes mellitus without complications: Secondary | ICD-10-CM

## 2023-11-25 DIAGNOSIS — E876 Hypokalemia: Secondary | ICD-10-CM | POA: Diagnosis not present

## 2023-11-25 DIAGNOSIS — E1169 Type 2 diabetes mellitus with other specified complication: Secondary | ICD-10-CM

## 2023-11-25 MED ORDER — VITAMIN D (ERGOCALCIFEROL) 1.25 MG (50000 UNIT) PO CAPS
50000.0000 [IU] | ORAL_CAPSULE | ORAL | 0 refills | Status: DC
  Filled 2023-11-25 – 2024-02-26 (×2): qty 6, 84d supply, fill #0

## 2023-11-25 MED ORDER — TIRZEPATIDE 12.5 MG/0.5ML ~~LOC~~ SOAJ
12.5000 mg | SUBCUTANEOUS | 0 refills | Status: DC
  Filled 2023-11-25 – 2023-11-30 (×2): qty 2, 28d supply, fill #0

## 2023-11-25 NOTE — Progress Notes (Signed)
Office: [phone]  /  Fax: [phone]  WEIGHT SUMMARY AND BIOMETRICS  Weight Lost Since Last Visit: 4lb  Weight Gained Since Last Visit: 0lb   Vitals Temp: 97.7 F (36.5 C) BP: (!) 141/87 Pulse Rate: 80 SpO2: 98 %   Anthropometric Measurements Height: 5\' 6"  (1.676 m) Weight: 260 lb (117.9 kg) BMI (Calculated): 41.99 Weight at Last Visit: 264lb Weight Lost Since Last Visit: 4lb Weight Gained Since Last Visit: 0lb Starting Weight: 290lb Total Weight Loss (lbs): 30 lb (13.6 kg)   Body Composition  Body Fat %: 49.5 % Fat Mass (lbs): 129 lbs Muscle Mass (lbs): 125 lbs Total Body Water (lbs): 91.6 lbs Visceral Fat Rating : 15   Other Clinical Data Fasting: Yes Labs: No Today's Visit #: 10 Starting Date: 04/21/23     HPI  Chief Complaint: OBESITY  Pam Mckee is here to discuss her progress with her obesity treatment plan. She is on the the Category 3 Plan and states she is following her eating plan approximately 50 % of the time. She states she is exercising 10-20 minutes 7 days per week.   Interval History:  Since last office visit she has lost 4 pounds.  She is doing well with the meal plan.  Not skipping meals and is eating a protein with each meal.  She is drinking water and occ soda.    Her highest weight was 298 lbs.     Pharmacotherapy for weight loss: She is not currently taking medications  for medical weight loss.     Previous pharmacotherapy for medical weight loss:  Ozempic   Bariatric surgery:  Patient has not had bariatric surgery   Pharmacotherapy for DMT2:  She is currently taking Mounjaro 12.5mg .  Denies side effects.   Last A1c was 6.1 She is not checking BS at home.   Episodes of hypoglycemia: no She is on an ARB and statin.  Last eye exam:  May 2024 She has tried Ozempic in the past.    Lab Results  Component Value Date   HGBA1C 6.1 (H) 10/29/2023   HGBA1C 6.6 (H) 04/21/2023   HGBA1C 6.4 (H) 07/17/2020   Lab Results   Component Value Date   LDLCALC 80 10/29/2023   CREATININE 0.83 10/29/2023    Hyperlipidemia Medication(s): Crestor 10mg . Denies side effects.   Cardiovascular risk factors: diabetes mellitus and obesity (BMI >= 30 kg/m2)  Lab Results  Component Value Date   CHOL 140 10/29/2023   HDL 42 10/29/2023   LDLCALC 80 10/29/2023   TRIG 95 10/29/2023   CHOLHDL 5.3 (H) 07/17/2020   Lab Results  Component Value Date   ALT 12 10/29/2023   AST 14 10/29/2023   ALKPHOS 96 10/29/2023   BILITOT 0.4 10/29/2023   The 10-year ASCVD risk score (Arnett DK, et al., 2019) is: 12.8%   Values used to calculate the score:     Age: 52 years     Sex: Female     Is Non-Hispanic African American: Yes     Diabetic: Yes     Tobacco smoker: No     Systolic Blood Pressure: 141 mmHg     Is BP treated: Yes     HDL Cholesterol: 42 mg/dL     Total Cholesterol: 140 mg/dL   Vit D deficiency  She is taking Vit D 50,000 IU weekly.  Denies side effects.  Denies nausea, vomiting or muscle weakness.    Lab Results  Component Value Date   VD25OH 59.5 10/29/2023  VD25OH 20.4 (L) 04/21/2023   VD25OH 18.5 (L) 07/17/2020    Hypertension Hypertension BP is elevated today.  Patient hasn't taken her meds yet today.  Medication(s): hydrochlorothiazide 50mg , Cozaar 100mg .  Denies side effects.  Denies chest pain, palpitations and SOB.  BP Readings from Last 3 Encounters:  11/25/23 (!) 141/87  10/29/23 131/83  10/26/23 118/80   Lab Results  Component Value Date   CREATININE 0.83 10/29/2023   CREATININE 0.76 04/21/2023   CREATININE 0.76 07/17/2020     Hypokalemia Patient is currently taking hydrochlorothiazide 50mg .  Hasn't taken potassium in the past.  Will recheck labs today.   PHYSICAL EXAM:  Blood pressure (!) 141/87, pulse 80, temperature 97.7 F (36.5 C), height 5\' 6"  (1.676 m), weight 260 lb (117.9 kg), SpO2 98%. Body mass index is 41.97 kg/m.  General: She is overweight, cooperative, alert,  well developed, and in no acute distress. PSYCH: Has normal mood, affect and thought process.   Extremities: No edema.  Neurologic: No gross sensory or motor deficits. No tremors or fasciculations noted.    DIAGNOSTIC DATA REVIEWED:  BMET    Component Value Date/Time   NA 138 10/29/2023 0747   K 3.4 (L) 10/29/2023 0747   CL 97 10/29/2023 0747   CO2 27 10/29/2023 0747   GLUCOSE 90 10/29/2023 0747   GLUCOSE 86 09/09/2016 0837   BUN 17 10/29/2023 0747   CREATININE 0.83 10/29/2023 0747   CREATININE 0.84 09/09/2016 0837   CALCIUM 9.2 10/29/2023 0747   GFRNONAA 93 07/17/2020 0835   GFRNONAA >89 01/14/2016 1328   GFRAA 107 07/17/2020 0835   GFRAA >89 01/14/2016 1328   Lab Results  Component Value Date   HGBA1C 6.1 (H) 10/29/2023   HGBA1C 5.5 01/14/2016   Lab Results  Component Value Date   INSULIN 74.3 (H) 04/21/2023   Lab Results  Component Value Date   TSH 1.830 04/21/2023   CBC    Component Value Date/Time   WBC 9.0 03/08/2018 1158   WBC 11.2 (A) 11/24/2016 1311   WBC 7.6 11/19/2016 1337   RBC 4.63 03/08/2018 1158   RBC 5.22 11/24/2016 1311   RBC 4.47 11/19/2016 1337   HGB 11.7 03/08/2018 1158   HCT 37.4 03/08/2018 1158   PLT 353 03/08/2018 1158   MCV 81 03/08/2018 1158   MCH 25.3 (L) 03/08/2018 1158   MCH 25.2 (A) 11/24/2016 1311   MCH 24.4 (L) 11/19/2016 1337   MCHC 31.3 (L) 03/08/2018 1158   MCHC 34.0 11/24/2016 1311   MCHC 31.3 (L) 11/19/2016 1337   RDW 15.0 03/08/2018 1158   Iron Studies    Component Value Date/Time   IRON 30 (L) 07/12/2015 1826   TIBC 371 07/12/2015 1826   FERRITIN 82 03/08/2018 1158   IRONPCTSAT 8 (L) 07/12/2015 1826   Lipid Panel     Component Value Date/Time   CHOL 140 10/29/2023 0747   TRIG 95 10/29/2023 0747   HDL 42 10/29/2023 0747   CHOLHDL 5.3 (H) 07/17/2020 0835   CHOLHDL 4.3 09/09/2016 0825   VLDL 10 09/09/2016 0825   LDLCALC 80 10/29/2023 0747   Hepatic Function Panel     Component Value Date/Time   PROT  7.2 10/29/2023 0747   ALBUMIN 4.0 10/29/2023 0747   AST 14 10/29/2023 0747   ALT 12 10/29/2023 0747   ALKPHOS 96 10/29/2023 0747   BILITOT 0.4 10/29/2023 0747      Component Value Date/Time   TSH 1.830 04/21/2023 0902   Nutritional  Lab Results  Component Value Date   VD25OH 59.5 10/29/2023   VD25OH 20.4 (L) 04/21/2023   VD25OH 18.5 (L) 07/17/2020     ASSESSMENT AND PLAN  TREATMENT PLAN FOR OBESITY:  Recommended Dietary Goals  Tuba is currently in the action stage of change. As such, her goal is to continue weight management plan. She has agreed to the Category 3 Plan.  Behavioral Intervention  We discussed the following Behavioral Modification Strategies today: celebration eating strategies and continue to work on maintaining a reduced calorie state, getting the recommended amount of protein, incorporating whole foods, making healthy choices, staying well hydrated and practicing mindfulness when eating..  Additional resources provided today: NA  Recommended Physical Activity Goals  Dazja has been advised to work up to 150 minutes of moderate intensity aerobic activity a week and strengthening exercises 2-3 times per week for cardiovascular health, weight loss maintenance and preservation of muscle mass.   She has agreed to Think about enjoyable ways to increase daily physical activity and overcoming barriers to exercise, Increase physical activity in their day and reduce sedentary time (increase NEAT)., Increase the intensity, frequency or duration of strengthening exercises , and Increase the intensity, frequency or duration of aerobic exercises     ASSOCIATED CONDITIONS ADDRESSED TODAY  Action/Plan  Type 2 diabetes mellitus without complication, without long-term current use of insulin (HCC) -     Continue Tirzepatide; Inject 12.5 mg into the skin once a week.  Dispense: 2 mL; Refill: 0.  Side effects discussed.   Good blood sugar control is important to decrease  the likelihood of diabetic complications such as nephropathy, neuropathy, limb loss, blindness, coronary artery disease, and death. Intensive lifestyle modification including diet, exercise and weight loss are the first line of treatment for diabetes.    Hyperlipidemia associated with type 2 diabetes mellitus (HCC) Continue to follow up with PCP.  Continue meds as directed  Vitamin D deficiency -     Vitamin D (Ergocalciferol); Take 1 capsule (50,000 Units total) by mouth every 14 (fourteen) days.  Dispense: 6 capsule; Refill: 0  Hypertension associated with type 2 diabetes mellitus (HCC) Continue to follow up with PCP.  Continue meds as directed Will start monitoring BP at home If continues to be elevated to reach out to PCP Has follow up appt with PCP in March  Hypokalemia -     Potassium  Morbid obesity (HCC)  BMI 40.0-44.9, adult (HCC)       Labs reviewed in chart with patient from 10/29/23  Return in about 4 weeks (around 12/23/2023).Marland Kitchen She was informed of the importance of frequent follow up visits to maximize her success with intensive lifestyle modifications for her multiple health conditions.   ATTESTASTION STATEMENTS:  Reviewed by clinician on day of visit: allergies, medications, problem list, medical history, surgical history, family history, social history, and previous encounter notes.     Theodis Sato. Shital Crayton FNP-C

## 2023-11-26 LAB — POTASSIUM: Potassium: 3.6 mmol/L (ref 3.5–5.2)

## 2023-11-30 ENCOUNTER — Other Ambulatory Visit: Payer: Self-pay

## 2023-12-03 ENCOUNTER — Other Ambulatory Visit: Payer: Self-pay

## 2023-12-03 ENCOUNTER — Other Ambulatory Visit (HOSPITAL_BASED_OUTPATIENT_CLINIC_OR_DEPARTMENT_OTHER): Payer: Self-pay | Admitting: Family Medicine

## 2023-12-03 MED ORDER — LEVOTHYROXINE SODIUM 112 MCG PO TABS
112.0000 ug | ORAL_TABLET | Freq: Every day | ORAL | 0 refills | Status: DC
  Filled 2023-12-03: qty 90, 90d supply, fill #0

## 2023-12-08 ENCOUNTER — Other Ambulatory Visit: Payer: Self-pay

## 2023-12-30 ENCOUNTER — Ambulatory Visit: Payer: 59 | Admitting: Nurse Practitioner

## 2023-12-30 ENCOUNTER — Other Ambulatory Visit: Payer: Self-pay

## 2023-12-30 ENCOUNTER — Encounter: Payer: Self-pay | Admitting: Nurse Practitioner

## 2023-12-30 VITALS — BP 133/79 | HR 82 | Temp 97.8°F | Ht 66.0 in | Wt 256.0 lb

## 2023-12-30 DIAGNOSIS — E119 Type 2 diabetes mellitus without complications: Secondary | ICD-10-CM

## 2023-12-30 DIAGNOSIS — Z6841 Body Mass Index (BMI) 40.0 and over, adult: Secondary | ICD-10-CM

## 2023-12-30 DIAGNOSIS — E66813 Obesity, class 3: Secondary | ICD-10-CM

## 2023-12-30 DIAGNOSIS — Z7985 Long-term (current) use of injectable non-insulin antidiabetic drugs: Secondary | ICD-10-CM

## 2023-12-30 MED ORDER — TIRZEPATIDE 12.5 MG/0.5ML ~~LOC~~ SOAJ
12.5000 mg | SUBCUTANEOUS | 0 refills | Status: DC
  Filled 2023-12-30: qty 2, 28d supply, fill #0

## 2023-12-30 NOTE — Progress Notes (Signed)
Office: [phone]  /  Fax: [phone]  WEIGHT SUMMARY AND BIOMETRICS  Weight Lost Since Last Visit: 4lb  Weight Gained Since Last Visit: 0lb   Vitals Temp: 97.8 F (36.6 C) BP: 133/79 Pulse Rate: 82 SpO2: 100 %   Anthropometric Measurements Height: 5\' 6"  (1.676 m) Weight: 256 lb (116.1 kg) BMI (Calculated): 41.34 Weight at Last Visit: 260lb Weight Lost Since Last Visit: 4lb Weight Gained Since Last Visit: 0lb Starting Weight: 290lb Total Weight Loss (lbs): 34 lb (15.4 kg)   Body Composition  Body Fat %: 49.5 % Fat Mass (lbs): 127.2 lbs Muscle Mass (lbs): 123 lbs Total Body Water (lbs): 89.2 lbs Visceral Fat Rating : 15   Other Clinical Data Fasting: Yes Labs: No Today's Visit #: 11 Starting Date: 04/21/23     HPI  Chief Complaint: OBESITY  Erianna is here to discuss her progress with her obesity treatment plan. She is on the the Category 3 Plan and states she is following her eating plan approximately 50 % of the time. She states she is exercising 10 minutes 5 days per week.   Interval History:  Since last office visit she has lost 4 pounds.  She has overall done well with weight loss.  She is not skipping meals. She is eating a protein with each meal.  She snacks on nuts, yogurt, cheese, pretzels and apples/banana (once per week).  She is drinking water and occ mini soda.   Next goal weight:  230 lbs  Her highest weight was 298 lbs.   Pharmacotherapy for weight loss: She is not currently taking medications  for medical weight loss.     Previous pharmacotherapy for medical weight loss:  Ozempic   Bariatric surgery:  Patient has not had bariatric surgery  Pharmacotherapy for DMT2:  She is currently taking Mounjaro 12.5mg .  Denies side effects.   Last A1c was 6.1 She is not checking BS at home.   Episodes of hypoglycemia: denies She is on an ARB and statin.  Last eye exam:  May 2024 She has tried Ozempic in the past.    Lab Results   Component Value Date   HGBA1C 6.1 (H) 10/29/2023   HGBA1C 6.6 (H) 04/21/2023   HGBA1C 6.4 (H) 07/17/2020   Lab Results  Component Value Date   LDLCALC 80 10/29/2023   CREATININE 0.83 10/29/2023      PHYSICAL EXAM:  Blood pressure 133/79, pulse 82, temperature 97.8 F (36.6 C), height 5\' 6"  (1.676 m), weight 256 lb (116.1 kg), SpO2 100%. Body mass index is 41.32 kg/m.  General: She is overweight, cooperative, alert, well developed, and in no acute distress. PSYCH: Has normal mood, affect and thought process.   Extremities: No edema.  Neurologic: No gross sensory or motor deficits. No tremors or fasciculations noted.    DIAGNOSTIC DATA REVIEWED:  BMET    Component Value Date/Time   NA 138 10/29/2023 0747   K 3.6 11/25/2023 0745   CL 97 10/29/2023 0747   CO2 27 10/29/2023 0747   GLUCOSE 90 10/29/2023 0747   GLUCOSE 86 09/09/2016 0837   BUN 17 10/29/2023 0747   CREATININE 0.83 10/29/2023 0747   CREATININE 0.84 09/09/2016 0837   CALCIUM 9.2 10/29/2023 0747   GFRNONAA 93 07/17/2020 0835   GFRNONAA >89 01/14/2016 1328   GFRAA 107 07/17/2020 0835   GFRAA >89 01/14/2016 1328   Lab Results  Component Value Date   HGBA1C 6.1 (H) 10/29/2023   HGBA1C 5.5 01/14/2016  Lab Results  Component Value Date   INSULIN 74.3 (H) 04/21/2023   Lab Results  Component Value Date   TSH 1.830 04/21/2023   CBC    Component Value Date/Time   WBC 9.0 03/08/2018 1158   WBC 11.2 (A) 11/24/2016 1311   WBC 7.6 11/19/2016 1337   RBC 4.63 03/08/2018 1158   RBC 5.22 11/24/2016 1311   RBC 4.47 11/19/2016 1337   HGB 11.7 03/08/2018 1158   HCT 37.4 03/08/2018 1158   PLT 353 03/08/2018 1158   MCV 81 03/08/2018 1158   MCH 25.3 (L) 03/08/2018 1158   MCH 25.2 (A) 11/24/2016 1311   MCH 24.4 (L) 11/19/2016 1337   MCHC 31.3 (L) 03/08/2018 1158   MCHC 34.0 11/24/2016 1311   MCHC 31.3 (L) 11/19/2016 1337   RDW 15.0 03/08/2018 1158   Iron Studies    Component Value Date/Time   IRON 30  (L) 07/12/2015 1826   TIBC 371 07/12/2015 1826   FERRITIN 82 03/08/2018 1158   IRONPCTSAT 8 (L) 07/12/2015 1826   Lipid Panel     Component Value Date/Time   CHOL 140 10/29/2023 0747   TRIG 95 10/29/2023 0747   HDL 42 10/29/2023 0747   CHOLHDL 5.3 (H) 07/17/2020 0835   CHOLHDL 4.3 09/09/2016 0825   VLDL 10 09/09/2016 0825   LDLCALC 80 10/29/2023 0747   Hepatic Function Panel     Component Value Date/Time   PROT 7.2 10/29/2023 0747   ALBUMIN 4.0 10/29/2023 0747   AST 14 10/29/2023 0747   ALT 12 10/29/2023 0747   ALKPHOS 96 10/29/2023 0747   BILITOT 0.4 10/29/2023 0747      Component Value Date/Time   TSH 1.830 04/21/2023 0902   Nutritional Lab Results  Component Value Date   VD25OH 59.5 10/29/2023   VD25OH 20.4 (L) 04/21/2023   VD25OH 18.5 (L) 07/17/2020     ASSESSMENT AND PLAN  TREATMENT PLAN FOR OBESITY:  Recommended Dietary Goals  Katonya is currently in the action stage of change. As such, her goal is to continue weight management plan. She has agreed to the Category 3 Plan.  Behavioral Intervention  We discussed the following Behavioral Modification Strategies today: increasing lean protein intake to established goals, decreasing simple carbohydrates , increasing vegetables, avoiding skipping meals, increasing water intake , reading food labels , keeping healthy foods at home, planning for success, and continue to work on maintaining a reduced calorie state, getting the recommended amount of protein, incorporating whole foods, making healthy choices, staying well hydrated and practicing mindfulness when eating..  Additional resources provided today: NA  Recommended Physical Activity Goals  Vaniah has been advised to work up to 150 minutes of moderate intensity aerobic activity a week and strengthening exercises 2-3 times per week for cardiovascular health, weight loss maintenance and preservation of muscle mass.   She has agreed to Think about enjoyable ways  to increase daily physical activity and overcoming barriers to exercise, Increase physical activity in their day and reduce sedentary time (increase NEAT)., Increase the intensity, frequency or duration of strengthening exercises , and Increase the intensity, frequency or duration of aerobic exercises     ASSOCIATED CONDITIONS ADDRESSED TODAY  Action/Plan  Type 2 diabetes mellitus without complication, without long-term current use of insulin (HCC) -     Tirzepatide; Inject 12.5 mg into the skin once a week.  Dispense: 2 mL; Refill: 0  Class 3 severe obesity due to excess calories with serious comorbidity and body mass index (  BMI) of 40.0 to 44.9 in adult [facility])      Will obtain labs at next visit.    Return in about 4 weeks (around 01/27/2024).Marland Kitchen She was informed of the importance of frequent follow up visits to maximize her success with intensive lifestyle modifications for her multiple health conditions.   ATTESTASTION STATEMENTS:  Reviewed by clinician on day of visit: allergies, medications, problem list, medical history, surgical history, family history, social history, and previous encounter notes.     Theodis Sato. Ouida Abeyta FNP-C

## 2023-12-31 ENCOUNTER — Other Ambulatory Visit: Payer: Self-pay

## 2024-01-06 ENCOUNTER — Encounter: Payer: 59 | Attending: Family Medicine | Admitting: Skilled Nursing Facility1

## 2024-01-06 ENCOUNTER — Encounter: Payer: Self-pay | Admitting: Skilled Nursing Facility1

## 2024-01-06 VITALS — Wt 259.2 lb

## 2024-01-06 DIAGNOSIS — E631 Imbalance of constituents of food intake: Secondary | ICD-10-CM | POA: Insufficient documentation

## 2024-01-06 NOTE — Progress Notes (Signed)
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: N/A Preferred learning style: no preference indicated Learning readiness: change in progress  NUTRITION ASSESSMENT  Anthropometrics  Body Composition Scale 07/29/2023 01/06/2024  Current Body Weight 283.8 259.2  Total Body Fat % 47 45.6  Visceral Fat 18 16  Fat-Free Mass % 52.9 54.3   Total Body Water % 40.9 41.6  Muscle-Mass lbs 33.7 32.6  BMI 44.2 41.8  Body Fat Displacement           Torso  lbs 82.8 73.2         Left Leg  lbs 16.5 14.6         Right Leg  lbs 16.5 14.6         Left Arm  lbs 8.2 7.3         Right Arm   lbs 8.2 7.3    Clinical Medical Hx: new Dx of T2D this year Medications: reviewed Mounjaro, Vit D 50,000 weekly Labs:  WNL Lab Results  Component Value Date   HGBA1C 6.1 (H) 10/29/2023   HGBA1C 6.6 (H) 04/21/2023   HGBA1C 6.4 (H) 07/17/2020    Notable Signs/Symptoms: weight gain, feet & ankles are swelling - today increase hydrochlorothiazide   Lifestyle & Dietary Hx Diet: apple daily, broccoli and spinach daily, activia yogurt. Pt states she ate [place] yogurt 2 weeks straight.   Pt states she works with healthy weight and wellness.  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  Pt states she is avoiding eating carbohydrates.    Estimated daily fluid intake: 50+ oz water Supplements: B vitamin Sleep: not assessed Stress / self-care: not assessed Current average weekly physical activity: Pt states she walks her puppy  24-Hr Dietary Recall  First Meal: 2-3 eggs and 2 bacon and fairlife milk Snack: Activia vanilla  Snack: fruit  Second Meal: a variety of salads: broccoli salad with raisin, onions, fruit salad, 1/2 thigh, 1/2 potato. Snack: laughing cow, 5 crackers Third Meal: 1/2 thigh, broccoli salad Snack:  Beverages: 24-40 oz water  (Smart water), milk   NUTRITION DIAGNOSIS  NB-1.1 Food and nutrition-related knowledge deficit As related to macros and relationship calories.  As evidenced by understanding reasoning for goals from healthy weight and wellness plan.  NUTRITION INTERVENTION  Nutrition education (E-1) on the following topics: Continued Visceral fat & exercise (body comp scale) Mindful eating, listening to body cues Water intake  Handouts Provided Include  Body comp handout  Learning Style & Readiness for Change Teaching method utilized: Visual & Auditory  Demonstrated degree of understanding via: Teach Back  Barriers to learning/adherence to lifestyle change: none  Goals Established by Pt I will increase my psychical activity  I am going to try to add more vegetables to my cooking such as bock choy or yellow peppers    MONITORING & EVALUATION Dietary intake, weekly physical activity, and cravings

## 2024-01-15 ENCOUNTER — Other Ambulatory Visit: Payer: Self-pay

## 2024-01-15 ENCOUNTER — Other Ambulatory Visit: Payer: Self-pay | Admitting: Nurse Practitioner

## 2024-01-15 ENCOUNTER — Other Ambulatory Visit (HOSPITAL_BASED_OUTPATIENT_CLINIC_OR_DEPARTMENT_OTHER): Payer: Self-pay | Admitting: Family Medicine

## 2024-01-15 DIAGNOSIS — E119 Type 2 diabetes mellitus without complications: Secondary | ICD-10-CM

## 2024-01-15 DIAGNOSIS — E1159 Type 2 diabetes mellitus with other circulatory complications: Secondary | ICD-10-CM

## 2024-01-18 ENCOUNTER — Other Ambulatory Visit: Payer: Self-pay

## 2024-01-20 ENCOUNTER — Encounter: Payer: 59 | Attending: Family Medicine | Admitting: Skilled Nursing Facility1

## 2024-01-20 ENCOUNTER — Encounter: Payer: Self-pay | Admitting: Skilled Nursing Facility1

## 2024-01-20 VITALS — Ht 66.0 in | Wt 257.9 lb

## 2024-01-20 DIAGNOSIS — E631 Imbalance of constituents of food intake: Secondary | ICD-10-CM | POA: Insufficient documentation

## 2024-01-20 NOTE — Progress Notes (Signed)
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: employee Preferred learning style: no preference indicated Learning readiness: change in progress  NUTRITION ASSESSMENT  Anthropometrics  Body Composition Scale 07/29/2023 01/06/2024  Current Body Weight 283.8 259.2  Total Body Fat % 47 45.6  Visceral Fat 18 16  Fat-Free Mass % 52.9 54.3   Total Body Water % 40.9 41.6  Muscle-Mass lbs 33.7 32.6  BMI 44.2 41.8  Body Fat Displacement           Torso  lbs 82.8 73.2         Left Leg  lbs 16.5 14.6         Right Leg  lbs 16.5 14.6         Left Arm  lbs 8.2 7.3         Right Arm   lbs 8.2 7.3    Clinical Medical Hx: new Dx of T2D this year Medications: reviewed Mounjaro, Vit D 50,000 weekly Labs:  WNL Lab Results  Component Value Date   HGBA1C 6.1 (H) 10/29/2023   HGBA1C 6.6 (H) 04/21/2023   HGBA1C 6.4 (H) 07/17/2020     Notable Signs/Symptoms: weight gain, feet & ankles are swelling - today increase hydrochlorothiazide   Lifestyle & Dietary Hx  Pt states she works with healthy weight and wellness.  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  Pt states she knows she needs to go for a walk after work but struggles with motivation.    Estimated daily fluid intake: 50+ oz water Supplements: B vitamin Sleep: not assessed Stress / self-care: not assessed Current average weekly physical activity: Pt states she walks her puppy  24-Hr Dietary Recall  First Meal: 2-3 eggs and 2 bacon and fairlife milk or skipped Snack: Activia vanilla  Snack: fruit  Second Meal: a variety of salads: broccoli salad with raisin, onions, fruit salad, 1/2 thigh, 1/2 potato. Snack: laughing cow, 5 crackers Third Meal: 1/2 thigh, broccoli salad Snack: popcorn Beverages: 24-40 oz water (Smart water), milk, tea, coffee  NUTRITION  DIAGNOSIS  NB-1.1 Food and nutrition-related knowledge deficit As related to macros and relationship calories.  As evidenced by understanding reasoning for goals from healthy weight and wellness plan.  NUTRITION INTERVENTION  Nutrition education (E-1) on the following topics:  Creation of balanced and diverse meals to increase the intake of nutrient-rich foods that provide essential vitamins, minerals, fiber, and phytonutrients Variety of Fruits and Vegetables: Aim for a colorful array of fruits and vegetables to ensure a wide range of nutrients. Include a mix of leafy greens, berries, citrus fruits, cruciferous vegetables, and more. Whole Grains: Choose whole grains over refined grains. Examples include brown rice, quinoa, oats, whole wheat, and barley. Lean Proteins: Include lean sources of protein, such as poultry, fish, tofu, legumes, beans, lentils, and low-fat dairy products. Limit red and processed meats. Healthy Fats: Incorporate sources of healthy fats, including avocados, nuts, seeds, and olive oil. Limit saturated and trans fats found in fried and processed foods. Dairy or Dairy Alternatives: Choose low-fat or fat-free dairy products, or plant-based alternatives like almond or soy milk. Portion Control: Be mindful of portion sizes to avoid overeating. Pay attention to hunger and satisfaction cues. Limit Added Sugars: Minimize the consumption of sugary beverages, snacks, and desserts. Check food labels for added sugars and opt for  natural sources of sweetness such as whole fruits. Hydration: Drink plenty of water throughout the day. Limit sugary drinks and excessive caffeine intake. Moderate Sodium Intake: Reduce the consumption of high-sodium foods. Use herbs and spices for flavor instead of excessive salt. Meal Planning and Preparation: Plan and prepare meals ahead of time to make healthier choices more convenient. Include a mix of food groups in each meal. Limit Processed  Foods: Minimize the intake of highly processed and packaged foods that are often high in added sugars, salt, and unhealthy fats. Regular Physical Activity: Combine a healthy diet with regular physical activity for overall well-being. Aim for at least 150 minutes of moderate-intensity aerobic exercise per week, along with strength training. Moderation and Balance: Enjoy treats and indulgent foods in moderation, emphasizing balance rather than strict restriction.  Handouts Previously Provided Include  Body comp handout  Learning Style & Readiness for Change Teaching method utilized: Visual & Auditory  Demonstrated degree of understanding via: Teach Back  Barriers to learning/adherence to lifestyle change: none  Goals Established by Pt  I will increase my psychical activity buy watching TV on my walks or being sure to take [place] for a walk committing to 3 days a week  I am going to try to add more vegetables to my cooking 2 times day 7 days a week   MONITORING & EVALUATION Dietary intake, weekly physical activity, and cravings

## 2024-01-27 ENCOUNTER — Telehealth: Payer: 59 | Admitting: Nurse Practitioner

## 2024-01-27 ENCOUNTER — Other Ambulatory Visit: Payer: Self-pay

## 2024-01-27 DIAGNOSIS — E119 Type 2 diabetes mellitus without complications: Secondary | ICD-10-CM

## 2024-01-27 DIAGNOSIS — Z7985 Long-term (current) use of injectable non-insulin antidiabetic drugs: Secondary | ICD-10-CM

## 2024-01-27 DIAGNOSIS — Z6841 Body Mass Index (BMI) 40.0 and over, adult: Secondary | ICD-10-CM | POA: Diagnosis not present

## 2024-01-27 DIAGNOSIS — E66813 Obesity, class 3: Secondary | ICD-10-CM

## 2024-01-27 MED ORDER — TIRZEPATIDE 15 MG/0.5ML ~~LOC~~ SOAJ
15.0000 mg | SUBCUTANEOUS | 0 refills | Status: DC
  Filled 2024-01-27: qty 2, 28d supply, fill #0

## 2024-01-27 NOTE — Progress Notes (Signed)
 Office: [phone]  /  Fax: [phone]  WEIGHT SUMMARY AND BIOMETRICS  TeleHealth Visit:  This visit was completed with telemedicine (audio/video) technology. Pam Mckee has verbally consented to this TeleHealth visit. The patient is located at work and the provider is located at [employer]. The participants in this visit include the listed provider and patient. The visit was conducted today via MyChart video.   HPI  Chief Complaint: OBESITY  Pam Mckee is here to discuss her progress with her obesity treatment plan. She is on the the Category 3 Plan and states she is following her eating plan approximately 70 % of the time. She states she is walking 5 days per week for 10 minutes.   Interval History:  Last weight was 257 lbs.  She saw RD last on 01/20/24 and 01/06/24.    Next goal weight:  230 lbs   Her highest weight was 298 lbs.   "24-Hr Dietary Recall  First Meal: 2-3 eggs and 2 bacon and fairlife milk or skipped Snack: Activia vanilla  Snack: fruit  Second Meal: a variety of salads: broccoli salad with raisin, onions, fruit salad, 1/2 thigh, 1/2 potato. Snack: laughing cow, 5 crackers Third Meal: 1/2 thigh, broccoli salad Snack: popcorn Beverages: 24-40 oz water (Smart water), milk, tea, coffee"  Note copied from RD note on 01/20/24   Pharmacotherapy for weight loss: She is not currently taking medications  for medical weight loss.     Previous pharmacotherapy for medical weight loss:  Ozempic   Bariatric surgery:  Patient has not had bariatric surgery   Pharmacotherapy for DMT2:   She is currently taking Mounjaro 12.5mg .  Denies side effects.  She is asking to increase her dose today to help with polyphagia and cravings.  Last A1c was 6.1 She is not checking BS at home.   Episodes of hypoglycemia: denies She is on an ARB and statin.  Last eye exam:  May 2024 She has tried Ozempic in the past.    Lab Results  Component Value Date   HGBA1C 6.1 (H) 10/29/2023    HGBA1C 6.6 (H) 04/21/2023   HGBA1C 6.4 (H) 07/17/2020   Lab Results  Component Value Date   LDLCALC 80 10/29/2023   CREATININE 0.83 10/29/2023     PHYSICAL EXAM:  There were no vitals taken for this visit. There is no height or weight on file to calculate BMI.  General: She is overweight, cooperative, alert, well developed, and in no acute distress. PSYCH: Has normal mood, affect and thought process.   Extremities: No edema.  Neurologic: No gross sensory or motor deficits. No tremors or fasciculations noted.    DIAGNOSTIC DATA REVIEWED:  BMET    Component Value Date/Time   NA 138 10/29/2023 0747   K 3.6 11/25/2023 0745   CL 97 10/29/2023 0747   CO2 27 10/29/2023 0747   GLUCOSE 90 10/29/2023 0747   GLUCOSE 86 09/09/2016 0837   BUN 17 10/29/2023 0747   CREATININE 0.83 10/29/2023 0747   CREATININE 0.84 09/09/2016 0837   CALCIUM 9.2 10/29/2023 0747   GFRNONAA 93 07/17/2020 0835   GFRNONAA >89 01/14/2016 1328   GFRAA 107 07/17/2020 0835   GFRAA >89 01/14/2016 1328   Lab Results  Component Value Date   HGBA1C 6.1 (H) 10/29/2023   HGBA1C 5.5 01/14/2016   Lab Results  Component Value Date   INSULIN 74.3 (H) 04/21/2023   Lab Results  Component Value Date   TSH 1.830 04/21/2023   CBC  Component Value Date/Time   WBC 9.0 03/08/2018 1158   WBC 11.2 (A) 11/24/2016 1311   WBC 7.6 11/19/2016 1337   RBC 4.63 03/08/2018 1158   RBC 5.22 11/24/2016 1311   RBC 4.47 11/19/2016 1337   HGB 11.7 03/08/2018 1158   HCT 37.4 03/08/2018 1158   PLT 353 03/08/2018 1158   MCV 81 03/08/2018 1158   MCH 25.3 (L) 03/08/2018 1158   MCH 25.2 (A) 11/24/2016 1311   MCH 24.4 (L) 11/19/2016 1337   MCHC 31.3 (L) 03/08/2018 1158   MCHC 34.0 11/24/2016 1311   MCHC 31.3 (L) 11/19/2016 1337   RDW 15.0 03/08/2018 1158   Iron Studies    Component Value Date/Time   IRON 30 (L) 07/12/2015 1826   TIBC 371 07/12/2015 1826   FERRITIN 82 03/08/2018 1158   IRONPCTSAT 8 (L) 07/12/2015 1826    Lipid Panel     Component Value Date/Time   CHOL 140 10/29/2023 0747   TRIG 95 10/29/2023 0747   HDL 42 10/29/2023 0747   CHOLHDL 5.3 (H) 07/17/2020 0835   CHOLHDL 4.3 09/09/2016 0825   VLDL 10 09/09/2016 0825   LDLCALC 80 10/29/2023 0747   Hepatic Function Panel     Component Value Date/Time   PROT 7.2 10/29/2023 0747   ALBUMIN 4.0 10/29/2023 0747   AST 14 10/29/2023 0747   ALT 12 10/29/2023 0747   ALKPHOS 96 10/29/2023 0747   BILITOT 0.4 10/29/2023 0747      Component Value Date/Time   TSH 1.830 04/21/2023 0902   Nutritional Lab Results  Component Value Date   VD25OH 59.5 10/29/2023   VD25OH 20.4 (L) 04/21/2023   VD25OH 18.5 (L) 07/17/2020     ASSESSMENT AND PLAN  TREATMENT PLAN FOR OBESITY:  Recommended Dietary Goals  Pam Mckee is currently in the action stage of change. As such, her goal is to continue weight management plan. She has agreed to the Category 3 Plan.  Behavioral Intervention  We discussed the following Behavioral Modification Strategies today: increasing lean protein intake to established goals, decreasing simple carbohydrates , increasing vegetables, increasing water intake , work on meal planning and preparation, keeping healthy foods at home, continue to work on implementation of reduced calorie nutritional plan, continue to practice mindfulness when eating, planning for success, and continue to work on maintaining a reduced calorie state, getting the recommended amount of protein, incorporating whole foods, making healthy choices, staying well hydrated and practicing mindfulness when eating..  Additional resources provided today: NA  Recommended Physical Activity Goals  Pam Mckee has been advised to work up to 150 minutes of moderate intensity aerobic activity a week and strengthening exercises 2-3 times per week for cardiovascular health, weight loss maintenance and preservation of muscle mass.   She has agreed to Think about enjoyable ways to  increase daily physical activity and overcoming barriers to exercise, Increase physical activity in their day and reduce sedentary time (increase NEAT)., Increase the intensity, frequency or duration of strengthening exercises , and Increase the intensity, frequency or duration of aerobic exercises     ASSOCIATED CONDITIONS ADDRESSED TODAY  Action/Plan  Type 2 diabetes mellitus without complication, without long-term current use of insulin (HCC) -     Tirzepatide; Inject 15 mg into the skin once a week.  Dispense: 2 mL; Refill: 0  Class 3 severe obesity due to excess calories with serious comorbidity and body mass index (BMI) of 40.0 to 44.9 in adult [facility])      Goals:  Increase  exercising- add resistance training and core exercises Eat more vegetables Increase water intake  Patient is video visit today.  Will obtain labs at next visit.   Return in about 4 weeks (around 02/24/2024).Marland Kitchen She was informed of the importance of frequent follow up visits to maximize her success with intensive lifestyle modifications for her multiple health conditions.   ATTESTASTION STATEMENTS:  Reviewed by clinician on day of visit: allergies, medications, problem list, medical history, surgical history, family history, social history, and previous encounter notes.     Theodis Sato. Janann Boeve FNP-C

## 2024-02-01 ENCOUNTER — Encounter: Payer: Self-pay | Admitting: Obstetrics and Gynecology

## 2024-02-08 ENCOUNTER — Ambulatory Visit (HOSPITAL_BASED_OUTPATIENT_CLINIC_OR_DEPARTMENT_OTHER): Payer: 59 | Admitting: Family Medicine

## 2024-02-08 ENCOUNTER — Encounter (HOSPITAL_BASED_OUTPATIENT_CLINIC_OR_DEPARTMENT_OTHER): Payer: Self-pay | Admitting: Family Medicine

## 2024-02-08 VITALS — BP 138/84 | HR 76 | Ht 66.0 in | Wt 261.0 lb

## 2024-02-08 DIAGNOSIS — Z7985 Long-term (current) use of injectable non-insulin antidiabetic drugs: Secondary | ICD-10-CM

## 2024-02-08 DIAGNOSIS — E119 Type 2 diabetes mellitus without complications: Secondary | ICD-10-CM | POA: Diagnosis not present

## 2024-02-08 DIAGNOSIS — E038 Other specified hypothyroidism: Secondary | ICD-10-CM | POA: Diagnosis not present

## 2024-02-08 DIAGNOSIS — I10 Essential (primary) hypertension: Secondary | ICD-10-CM | POA: Diagnosis not present

## 2024-02-08 LAB — POCT UA - MICROALBUMIN
Creatinine, POC: 300 mg/dL
Microalbumin Ur, POC: 80 mg/L

## 2024-02-08 NOTE — Patient Instructions (Signed)
 Please obtain your Shingrix Shots in the Pharmacy.

## 2024-02-08 NOTE — Progress Notes (Signed)
 Subjective:   Pam Mckee 05-Feb-1971 02/08/2024  Chief Complaint  Patient presents with   Medical Management of Chronic Issues    60-month follow up for HTN; denies any main concerns for today's visit.    HPI: Pam Mckee presents today for re-assessment and management of chronic medical conditions.   HYPERTENSION: Pam LINGELBACH presents for the medical management of hypertension.  Patient's current hypertension medication regimen is: hydrochlorothiazide 50mg , losartan 100mg  (has not taken this morning)  Patient is  currently taking prescribed medications for HTN.  Patient is  regularly keeping a check on BP at home. Patient works at [employer] and checks while at work.  Adhering to low sodium diet: Yes Exercising Regularly: Yes, walks daily  Denies headache, dizziness, CP, SHOB, vision changes.   BP Readings from Last 3 Encounters:  02/08/24 138/84  12/30/23 133/79  11/25/23 (!) 141/87     HYPOTHYROIDISM: Patient presents for the medical management of Hypothyroidism. Current medication: Levothyroxine  Patient compliant with medication regimen: yes  Fatigue: no Cold intolerance: no Weight gain/loss: Yes, intentional weight loss w/ lifestyle changes  Constipation: no Lower extremity edema: no Palpitations: no Hoarseness: no Neck Pain/Compression: no Difficulty Swallowing: no  LAST TSH:  8.9 (10/07/2023)    DIABETES MELLITUS: Pam Mckee presents for the medical management of diabetes.  Current diabetes medication regimen: Mounjaro 15mg   Patient is  adhering to a diabetic diet.  Patient is  exercising regularly.  Patient is  checking their feet regularly.  Denies polydipsia, polyphagia, polyuria, open wounds or ulcers on feet.   Lab Results  Component Value Date   HGBA1C 6.1 (H) 10/29/2023    Foot Exam: 02/08/2024 Lab Results  Component Value Date   MICROALBUR 80 02/08/2024    Wt Readings from Last 3 Encounters:  02/08/24 261 lb (118.4 kg)   01/20/24 257 lb 14.4 oz (117 kg)  01/06/24 259 lb 3.2 oz (117.6 kg)    The following portions of the patient's history were reviewed and updated as appropriate: past medical history, past surgical history, family history, social history, allergies, medications, and problem list.   Patient Active Problem List   Diagnosis Date Noted   Chronic midline low back pain with left-sided sciatica 07/08/2023   Lower extremity edema 06/03/2023   Hypertension associated with type 2 diabetes mellitus (HCC) 06/03/2023   BMI 45.0-49.9, adult (HCC) 04/21/2023   OSA (obstructive sleep apnea) 04/21/2023   Other hyperlipidemia 04/21/2023   Health care maintenance 04/21/2023   SOB (shortness of breath) on exertion 04/21/2023   Other fatigue 04/21/2023   Colon cancer screening 08/19/2021   Morbid obesity (HCC) 08/19/2021   Right knee pain 07/13/2020   Uterine leiomyoma 08/11/2019   Type 2 diabetes mellitus (HCC)    Elevated lipids    Nutritional counseling 04/26/2019   Simple obesity 02/19/2018   Family history of aneurysm 12/09/2015   Tear of meniscus, knee, medial 2017   Left hip pain 07/10/2015   Back pain with left-sided sciatica 07/10/2015   Hypothyroidism 06/06/2015   Nontoxic single thyroid nodule 06/06/2015   Obstructive sleep apnea 05/18/2015   Breast calcifications on mammogram 05/18/2015   Menorrhagia with regular cycle 05/18/2015   Chronic pain of left knee 04/23/2015   Severe obesity (BMI >= 40) (HCC) 03/15/2015   Thyroid activity decreased 03/15/2015   Vitamin D deficiency 03/15/2015   Essential hypertension 02/15/2015   Past Medical History:  Diagnosis Date   Anemia  Back pain    Elevated lipids    High cholesterol    Hypertension    Olecranon bursitis of right elbow 02/15/2015   Suspect this was an acute gout flare triggered by HCTZ since she had no trauma    OSA on CPAP    Prediabetes    Primary osteoarthritis of left knee 05/18/2015   Nonweightbearing x-rays  revealed mild degenerative changes.  Referred to [facility]  - saw Lockie Pares , PA-C    Seizures [facility])    Thyroid disease    hypo   Torn LEFT medial meniscus 04/2015   Left knee MRI confirmed but no instability sxs. Seen at [facility] 04/2016 to review treatment plan. sxs responded to cortisone injection summer 2016 and 04/2016 so continue prn. Consider arthroscopy if sxs become unresponsive.   Past Surgical History:  Procedure Laterality Date   COLONOSCOPY WITH PROPOFOL N/A 04/05/2021   Procedure: COLONOSCOPY WITH PROPOFOL;  Surgeon: Jeani Hawking, MD;  Location: [facility];  Service: Endoscopy;  Laterality: N/A;   INTRAUTERINE DEVICE (IUD) INSERTION  06/2016   POLYPECTOMY  04/05/2021   Procedure: POLYPECTOMY;  Surgeon: Jeani Hawking, MD;  Location: [facility];  Service: Endoscopy;;   Family History  Problem Relation Age of Onset   Hypertension Mother    CVA Mother        occured while on lovenox   Stroke Mother    Renal Disease Father    Kidney disease Father    Hypertension Brother    Aneurysm Brother    Diabetes Brother    Diabetes Paternal Grandfather    Breast cancer Neg Hx    Outpatient Medications Prior to Visit  Medication Sig Dispense Refill   hydrochlorothiazide (HYDRODIURIL) 50 MG tablet Take 1 tablet (50 mg total) by mouth daily. 90 tablet 1   levonorgestrel (MIRENA) 20 MCG/24HR IUD 1 each by Intrauterine route once.     levothyroxine (SYNTHROID) 112 MCG tablet Take 1 tablet (112 mcg total) by mouth daily on an empty stomach. No food or drink for 30 mins after taking medication. 90 tablet 0   losartan (COZAAR) 100 MG tablet Take 1 tablet (100 mg total) by mouth daily. 90 tablet 1   rosuvastatin (CRESTOR) 10 MG tablet Take 1 tablet (10 mg total) by mouth daily. 90 tablet 0   tirzepatide (MOUNJARO) 15 MG/0.5ML Pen Inject 15 mg into the skin once a week. 2 mL 0   triamcinolone ointment (KENALOG) 0.1 % Apply 1 Application topically 2 (two) times daily. 30 g 0    Vitamin D, Ergocalciferol, (DRISDOL) 1.25 MG (50000 UNIT) CAPS capsule Take 1 capsule (50,000 Units total) by mouth every 14 (fourteen) days. 6 capsule 0   No facility-administered medications prior to visit.   No Known Allergies   ROS: A complete ROS was performed with pertinent positives/negatives noted in the HPI. The remainder of the ROS are negative.    Objective:   Today's Vitals   02/08/24 0814 02/08/24 0904  BP: (!) 144/73 138/84  Pulse: 76   SpO2: 97%   Weight: 261 lb (118.4 kg)   Height: 5\' 6"  (1.676 m)     Physical Exam          GENERAL: Well-appearing, in NAD. Well nourished.  SKIN: Pink, warm and dry.  Head: Normocephalic. NECK: Trachea midline. Full ROM w/o pain or tenderness.  RESPIRATORY: Chest wall symmetrical. Respirations even and non-labored. Breath sounds clear to auscultation bilaterally.  CARDIAC: S1, S2 present, regular rate and rhythm  without murmur or gallops. Peripheral pulses 2+ bilaterally.  MSK: Muscle tone and strength appropriate for age.  EXTREMITIES: Without clubbing, cyanosis. Mild +1 non pitting edema to bilateral lower extremities.  NEUROLOGIC: No motor or sensory deficits. Steady, even gait. C2-C12 intact.  PSYCH/MENTAL STATUS: Alert, oriented x 3. Cooperative, appropriate mood and affect.   Diabetic Foot Exam - Simple   Simple Foot Form Diabetic Foot exam was performed with the following findings: Yes 02/08/2024  9:07 AM  Visual Inspection No deformities, no ulcerations, no other skin breakdown bilaterally: Yes Sensation Testing Intact to touch and monofilament testing bilaterally: Yes Pulse Check Posterior Tibialis and Dorsalis pulse intact bilaterally: Yes Comments      Health Maintenance Due  Topic Date Due   Zoster Vaccines- Shingrix (1 of 2) Never done    Results for orders placed or performed in visit on 02/08/24  POCT UA - Microalbumin  Result Value Ref Range   Microalbumin Ur, POC 80 mg/L   Creatinine, POC 300  mg/dL   Albumin/Creatinine Ratio, Urine, POC 30-300     The 10-year ASCVD risk score (Arnett DK, et al., 2019) is: 11.8%     Assessment & Plan:  1. Essential hypertension (Primary) Controlled. Patient did not take medication this morning, but reports compliance otherwise. Discussed importance of monitoring BP and medication. She verbalized understanding.   2. Type 2 diabetes mellitus without complication, without long-term current use of insulin (HCC) Controlled and followed by [facility]. Patient doing well with current medications and glucose control by diet. Urine albumin and foot exam completed in office today. Pt to continue on medications and diet/exercise changes as directed. Recommended completion of Shingrix vaccines at pharmacy.  - POCT UA - Microalbumin  3. Other specified hypothyroidism Controlled and asymptomatic at this time. Will check thyroid function given recent weight loss and adjust medication pending lab results  - T4, free - T3, free - TSH   Lab Orders         T4, free         T3, free         TSH         POCT UA - Microalbumin      Return in about 6 months (around 08/10/2024) for ANNUAL PHYSICAL, HYPERTENSION, DIABETES CHECK UP, Thyroid .    Patient to reach out to office if new, worrisome, or unresolved symptoms arise or if no improvement in patient's condition. Patient verbalized understanding and is agreeable to treatment plan. All questions answered to patient's satisfaction.    Hilbert Bible, [place]

## 2024-02-09 ENCOUNTER — Other Ambulatory Visit (HOSPITAL_COMMUNITY): Payer: Self-pay

## 2024-02-09 ENCOUNTER — Encounter (HOSPITAL_BASED_OUTPATIENT_CLINIC_OR_DEPARTMENT_OTHER): Payer: Self-pay | Admitting: Family Medicine

## 2024-02-09 DIAGNOSIS — I10 Essential (primary) hypertension: Secondary | ICD-10-CM

## 2024-02-09 DIAGNOSIS — E038 Other specified hypothyroidism: Secondary | ICD-10-CM

## 2024-02-09 LAB — T3, FREE: T3, Free: 2.8 pg/mL (ref 2.0–4.4)

## 2024-02-09 LAB — T4, FREE: Free T4: 1.22 ng/dL (ref 0.82–1.77)

## 2024-02-09 LAB — TSH: TSH: 4.12 u[IU]/mL (ref 0.450–4.500)

## 2024-02-09 NOTE — Progress Notes (Signed)
 Thyroid stable. No adjustment to medication needed.

## 2024-02-09 NOTE — Telephone Encounter (Signed)
 Pt sent mychart checking about the results of the microalbumin that was done. Pam Mckee, please advise on this.

## 2024-02-10 NOTE — Telephone Encounter (Signed)
 Pt wants to know if there is a different medication that could be prescribed to help with swelling in feet/ankles other than the hydrochlorothiazide or do you think she would be okay to remain on that with the other meds she is on. Please advise.

## 2024-02-15 ENCOUNTER — Other Ambulatory Visit (HOSPITAL_BASED_OUTPATIENT_CLINIC_OR_DEPARTMENT_OTHER): Payer: Self-pay | Admitting: *Deleted

## 2024-02-15 ENCOUNTER — Other Ambulatory Visit (HOSPITAL_BASED_OUTPATIENT_CLINIC_OR_DEPARTMENT_OTHER)

## 2024-02-15 DIAGNOSIS — E038 Other specified hypothyroidism: Secondary | ICD-10-CM

## 2024-02-15 DIAGNOSIS — I10 Essential (primary) hypertension: Secondary | ICD-10-CM | POA: Diagnosis not present

## 2024-02-15 LAB — BASIC METABOLIC PANEL
BUN/Creatinine Ratio: 18 (ref 9–23)
BUN: 14 mg/dL (ref 6–24)
CO2: 23 mmol/L (ref 20–29)
Calcium: 9.3 mg/dL (ref 8.7–10.2)
Chloride: 98 mmol/L (ref 96–106)
Creatinine, Ser: 0.8 mg/dL (ref 0.57–1.00)
Glucose: 105 mg/dL — ABNORMAL HIGH (ref 70–99)
Potassium: 3.4 mmol/L — ABNORMAL LOW (ref 3.5–5.2)
Sodium: 138 mmol/L (ref 134–144)
eGFR: 89 mL/min/{1.73_m2} (ref >59)

## 2024-02-15 LAB — TSH RFX ON ABNORMAL TO FREE T4: TSH: 3.6 u[IU]/mL (ref 0.450–4.500)

## 2024-02-16 ENCOUNTER — Encounter (HOSPITAL_BASED_OUTPATIENT_CLINIC_OR_DEPARTMENT_OTHER): Payer: Self-pay | Admitting: *Deleted

## 2024-02-16 NOTE — Progress Notes (Signed)
 Please let patient know that her potassium is slightly lower than normal at 3.4.  This is likely due to her HCTZ.  Increasing her diet and potassium rich foods can help to avoid this in the future.  I can send in a potassium supplement for her to take for the next 3 days to increase this and recheck in 2 weeks.  If she is agreeable please let me know

## 2024-02-16 NOTE — Telephone Encounter (Signed)
 Pam Mckee, please see mychart messages sent by pt about results of recent labwork.

## 2024-02-17 ENCOUNTER — Ambulatory Visit (INDEPENDENT_AMBULATORY_CARE_PROVIDER_SITE_OTHER): Payer: 59 | Admitting: Obstetrics and Gynecology

## 2024-02-17 ENCOUNTER — Other Ambulatory Visit: Payer: Self-pay

## 2024-02-17 ENCOUNTER — Other Ambulatory Visit (HOSPITAL_BASED_OUTPATIENT_CLINIC_OR_DEPARTMENT_OTHER): Payer: Self-pay | Admitting: Family Medicine

## 2024-02-17 ENCOUNTER — Encounter: Payer: Self-pay | Admitting: Obstetrics and Gynecology

## 2024-02-17 VITALS — BP 118/78 | HR 92 | Wt 257.0 lb

## 2024-02-17 DIAGNOSIS — D219 Benign neoplasm of connective and other soft tissue, unspecified: Secondary | ICD-10-CM

## 2024-02-17 DIAGNOSIS — E876 Hypokalemia: Secondary | ICD-10-CM

## 2024-02-17 DIAGNOSIS — N950 Postmenopausal bleeding: Secondary | ICD-10-CM

## 2024-02-17 MED ORDER — POTASSIUM CHLORIDE CRYS ER 10 MEQ PO TBCR
10.0000 meq | EXTENDED_RELEASE_TABLET | Freq: Two times a day (BID) | ORAL | 0 refills | Status: DC
  Filled 2024-02-17: qty 6, 3d supply, fill #0

## 2024-02-17 NOTE — Progress Notes (Signed)
 Patient presents to discuss the [facility]. She is wondering if the IUD will be removed along with fibroids at the time of surgery and has more questions on the procedure. She is worried about having heavy bleeding again if the IUD is removed. She does not want to worry about the bleeding, fibroids or risk for cancer in the future as well.  She is sure she wants to proceed with the [facility].   Blood pressure 118/78, pulse 92, weight 257 lb (116.6 kg), SpO2 99%.      53 y.o. y.o. female here for TV US results with left ovarian mass.  She denies any heavy vaginal bleeding.   She has had the mirena IUD since 2017. 06/17/16 Dr. Oscar La TV US with multiple fibroids, no adnexal masses identified  Narrative & Impression  CLINICAL DATA:  Fibroids, IUD.   EXAM: TRANSABDOMINAL AND TRANSVAGINAL ULTRASOUND OF PELVIS   TECHNIQUE: Both transabdominal and transvaginal ultrasound examinations of the pelvis were performed. Transabdominal technique was performed for global imaging of the pelvis including uterus, ovaries, adnexal regions, and pelvic cul-de-sac. It was necessary to proceed with endovaginal exam following the transabdominal exam to visualize the uterus, endometrium, ovaries and adnexal regions.   COMPARISON:  06/17/2016.   FINDINGS: Uterus   Measurements: 8.6 x 6.1 x 9.2 cm = volume: 253 mL. Intrauterine contraceptive device is in appropriate position. Multiple hypoechoic masses in the uterus, measuring up to 4.5 x 3.3 x 3.4 cm.   Endometrium   Thickness: 5 mm, within normal limits for a perimenopausal female in the absence of abnormal uterine bleeding. No focal abnormality visualized.   Right ovary   Not visualized.   Left ovary   Measurements: 4.7 x 3.8 x 4.0 cm = volume: 38 mL. Image quality is limited by discomfort due to vaginal probe and body habitus. There is a 3.4 x 3.0 x 3.0 cm hypoechoic mass or anechoic mass with homogeneous low level internal echoes. Associated  increased through transmission.   Other findings   Trace pelvic free fluid.   IMPRESSION: 1. Difficult exam due to retroverted uterus, uterine fibroids, body habitus and discomfort. 2. Left ovarian hypoechoic mass versus anechoic mass with homogeneous low level internal echoes. Associated increased through transmission. Initial follow-up pelvic ultrasound in 6-12 weeks is recommended as malignancy cannot be excluded. These results will be called to the ordering clinician or representative by the Radiologist Assistant, and communication documented in the PACS or [employer]. 3. Uterine fibroids. 4. Intrauterine contraceptive device in appropriate position.     Electronically Signed   By: Leanna Battles M.D.   On: 10/22/2023 08:56    She has lost 30lbs with wellness program at [facility] Maintenance: Pap:   08/04/2018 WNL NEG HPV,05/18/2015 WNL  History of abnormal Pap:  no MMG:  09/03/21 density B Bi-rads 1 neg  BMD:   n/a Colonoscopy: 04/05/21 polyps follow up 7 years  TDaP:  2013 Gardasil: n/a  Blood pressure 118/78, pulse 92, weight 257 lb (116.6 kg), SpO2 99%.     Component Value Date/Time   DIAGPAP  09/21/2023 1729    - Negative for intraepithelial lesion or malignancy (NILM)   DIAGPAP  08/04/2018 0000    NEGATIVE FOR INTRAEPITHELIAL LESIONS OR MALIGNANCY.   HPVHIGH Negative 09/21/2023 1729   ADEQPAP  09/21/2023 1729    Satisfactory for evaluation; transformation zone component PRESENT.   ADEQPAP  08/04/2018 0000    Satisfactory for evaluation  endocervical/transformation zone component PRESENT.  GYN HISTORY:    Component Value Date/Time   DIAGPAP  09/21/2023 1729    - Negative for intraepithelial lesion or malignancy (NILM)   DIAGPAP  08/04/2018 0000    NEGATIVE FOR INTRAEPITHELIAL LESIONS OR MALIGNANCY.   HPVHIGH Negative 09/21/2023 1729   ADEQPAP  09/21/2023 1729    Satisfactory for evaluation; transformation zone component PRESENT.    ADEQPAP  08/04/2018 0000    Satisfactory for evaluation  endocervical/transformation zone component PRESENT.    OB History  Gravida Para Term Preterm AB Living  2 1 1  0 1 1  SAB IAB Ectopic Multiple Live Births  0 1 0 0     # Outcome Date GA Lbr Len/2nd Weight Sex Type Anes PTL Lv  2 IAB           1 Term             Past Medical History:  Diagnosis Date   Anemia    Back pain    Elevated lipids    High cholesterol    Hypertension    Olecranon bursitis of right elbow 02/15/2015   Suspect this was an acute gout flare triggered by HCTZ since she had no trauma    OSA on CPAP    Prediabetes    Primary osteoarthritis of left knee 05/18/2015   Nonweightbearing x-rays revealed mild degenerative changes.  [facility]  - saw Lockie Pares , PA-C    Seizures [facility])    Thyroid disease    hypo   Torn LEFT medial meniscus 04/2015   Left knee MRI confirmed but no instability sxs. Seen at [facility] 04/2016 to review treatment plan. sxs responded to cortisone injection summer 2016 and 04/2016 so continue prn. Consider arthroscopy if sxs become unresponsive.    Past Surgical History:  Procedure Laterality Date   COLONOSCOPY WITH PROPOFOL N/A 04/05/2021   Procedure: COLONOSCOPY WITH PROPOFOL;  Surgeon: Jeani Hawking, MD;  Location: [facility];  Service: Endoscopy;  Laterality: N/A;   INTRAUTERINE DEVICE (IUD) INSERTION  06/2016   POLYPECTOMY  04/05/2021   Procedure: POLYPECTOMY;  Surgeon: Jeani Hawking, MD;  Location: [facility];  Service: Endoscopy;;    Current Outpatient Medications on File Prior to Visit  Medication Sig Dispense Refill   hydrochlorothiazide (HYDRODIURIL) 50 MG tablet Take 1 tablet (50 mg total) by mouth daily. 90 tablet 1   levonorgestrel (MIRENA) 20 MCG/24HR IUD 1 each by Intrauterine route once.     levothyroxine (SYNTHROID) 112 MCG tablet Take 1 tablet (112 mcg total) by mouth daily on an empty stomach. No food or drink for 30 mins after taking  medication. 90 tablet 0   losartan (COZAAR) 100 MG tablet Take 1 tablet (100 mg total) by mouth daily. 90 tablet 1   rosuvastatin (CRESTOR) 10 MG tablet Take 1 tablet (10 mg total) by mouth daily. 90 tablet 0   tirzepatide (MOUNJARO) 15 MG/0.5ML Pen Inject 15 mg into the skin once a week. 2 mL 0   triamcinolone ointment (KENALOG) 0.1 % Apply 1 Application topically 2 (two) times daily. 30 g 0   Vitamin D, Ergocalciferol, (DRISDOL) 1.25 MG (50000 UNIT) CAPS capsule Take 1 capsule (50,000 Units total) by mouth every 14 (fourteen) days. 6 capsule 0   No current facility-administered medications on file prior to visit.    Social History   Socioeconomic History   Marital status: Married    Spouse name: Not on file   Number of children: 1   Years of  education: HS   Highest education level: 12th grade  Occupational History   Occupation: Arboriculturist: [place]  Tobacco Use   Smoking status: Never   Smokeless tobacco: Never  Vaping Use   Vaping status: Never Used  Substance and Sexual Activity   Alcohol use: No    Alcohol/week: 0.0 standard drinks of alcohol   Drug use: No   Sexual activity: Yes    Partners: Male    Birth control/protection: I.U.D.  Other Topics Concern   Not on file  Social History Narrative      Drinks about 2-3 cups of coffee/tea a day. Education: [employer].   Social Drivers of Corporate investment banker Strain: Low Risk  (02/04/2024)   Overall Financial Resource Strain (CARDIA)    Difficulty of Paying Living Expenses: Not very hard  Food Insecurity: Food Insecurity Present (02/04/2024)   Hunger Vital Sign    Worried About Running Out of Food in the Last Year: Sometimes true    Ran Out of Food in the Last Year: Sometimes true  Transportation Needs: No Transportation Needs (02/04/2024)   PRAPARE - Administrator, Civil Service (Medical): No    Lack of Transportation (Non-Medical): No  Physical Activity: Insufficiently  Active (02/04/2024)   Exercise Vital Sign    Days of Exercise per Week: 4 days    Minutes of Exercise per Session: 10 min  Stress: No Stress Concern Present (02/04/2024)   [employer] of Occupational Health - Occupational Stress Questionnaire    Feeling of Stress : Not at all  Social Connections: Moderately Integrated (02/04/2024)   Social Connection and Isolation Panel [NHANES]    Frequency of Communication with Friends and Family: More than three times a week    Frequency of Social Gatherings with Friends and Family: More than three times a week    Attends Religious Services: 1 to 4 times per year    Active Member of [employer] or Organizations: No    Attends Engineer, structural: Not on file    Marital Status: Married  Catering manager Violence: Not At Risk (02/08/2024)   Humiliation, Afraid, Rape, and Kick questionnaire    Fear of Current or Ex-Partner: No    Emotionally Abused: No    Physically Abused: No    Sexually Abused: No    Family History  Problem Relation Age of Onset   Hypertension Mother    CVA Mother        occured while on lovenox   Stroke Mother    Renal Disease Father    Kidney disease Father    Hypertension Brother    Aneurysm Brother    Diabetes Brother    Diabetes Paternal Grandfather    Breast cancer Neg Hx      No Known Allergies    Patient's last menstrual period was No LMP recorded. (Menstrual status: IUD)..            Review of Systems Alls systems reviewed and are negative.       A:         Symptomatic fibroids, mirena IUD in place, left ovarian mass with normal ova1 desires RLH, LSO, right salpingectomy, cystosocpy                             P:       Reviewed the RLH in detail. Discussed the uterus, fallopain tube and  left ovary and fibroids and IUD will be removed with this procedure.  Discussed possible RSO, if she desires or with an abnormal appearance.  Discussed current recommendations are to keep the ovaries or at least  one in until age 37 for heart protection. She agreed.  All questions were answered.  To return for EMB. She will take motrin an hour before the procedure.  She would still like to have the [facility] in July.  No follow-ups on file.   Earley Favor

## 2024-02-18 ENCOUNTER — Other Ambulatory Visit: Payer: Self-pay

## 2024-02-26 ENCOUNTER — Other Ambulatory Visit: Payer: Self-pay

## 2024-02-29 ENCOUNTER — Other Ambulatory Visit: Payer: Self-pay

## 2024-03-01 ENCOUNTER — Telehealth: Payer: Self-pay | Admitting: Skilled Nursing Facility1

## 2024-03-01 NOTE — Telephone Encounter (Signed)
 Pt arrived to get her weight taken.   Body Composition Scale 03/01/2024  Current Body Weight 258  Total Body Fat % 45.6  Visceral Fat 17  Fat-Free Mass % 54.3   Total Body Water % 41.6  Muscle-Mass lbs 32.5  BMI 41.6  Body Fat Displacement          Torso  lbs 72.9         Left Leg  lbs 14.5         Right Leg  lbs 14.5         Left Arm  lbs 7.2         Right Arm   lbs 7.2

## 2024-03-02 ENCOUNTER — Ambulatory Visit (INDEPENDENT_AMBULATORY_CARE_PROVIDER_SITE_OTHER): Payer: 59 | Admitting: Nurse Practitioner

## 2024-03-02 ENCOUNTER — Encounter: Payer: Self-pay | Admitting: Nurse Practitioner

## 2024-03-02 ENCOUNTER — Other Ambulatory Visit: Payer: Self-pay

## 2024-03-02 VITALS — BP 135/82 | HR 85 | Temp 98.1°F | Ht 66.0 in | Wt 252.0 lb

## 2024-03-02 DIAGNOSIS — E559 Vitamin D deficiency, unspecified: Secondary | ICD-10-CM

## 2024-03-02 DIAGNOSIS — E876 Hypokalemia: Secondary | ICD-10-CM

## 2024-03-02 DIAGNOSIS — D509 Iron deficiency anemia, unspecified: Secondary | ICD-10-CM

## 2024-03-02 DIAGNOSIS — R7989 Other specified abnormal findings of blood chemistry: Secondary | ICD-10-CM

## 2024-03-02 DIAGNOSIS — E66813 Obesity, class 3: Secondary | ICD-10-CM

## 2024-03-02 DIAGNOSIS — Z79899 Other long term (current) drug therapy: Secondary | ICD-10-CM

## 2024-03-02 DIAGNOSIS — Z6841 Body Mass Index (BMI) 40.0 and over, adult: Secondary | ICD-10-CM

## 2024-03-02 DIAGNOSIS — Z7985 Long-term (current) use of injectable non-insulin antidiabetic drugs: Secondary | ICD-10-CM

## 2024-03-02 DIAGNOSIS — E119 Type 2 diabetes mellitus without complications: Secondary | ICD-10-CM | POA: Diagnosis not present

## 2024-03-02 MED ORDER — VITAMIN D (ERGOCALCIFEROL) 1.25 MG (50000 UNIT) PO CAPS
50000.0000 [IU] | ORAL_CAPSULE | ORAL | 0 refills | Status: DC
  Filled 2024-03-02: qty 6, 84d supply, fill #0

## 2024-03-02 MED ORDER — TIRZEPATIDE 15 MG/0.5ML ~~LOC~~ SOAJ
15.0000 mg | SUBCUTANEOUS | 0 refills | Status: DC
  Filled 2024-03-02: qty 6, 84d supply, fill #0

## 2024-03-02 NOTE — Progress Notes (Signed)
 Office: [phone]  /  Fax: [phone]  WEIGHT SUMMARY AND BIOMETRICS  Weight Lost Since Last Visit: 4lb  Weight Gained Since Last Visit: 0lb   Vitals Temp: 98.1 F (36.7 C) BP: 135/82 Pulse Rate: 85 SpO2: 97 %   Anthropometric Measurements Height: 5\' 6"  (1.676 m) Weight: 252 lb (114.3 kg) BMI (Calculated): 40.69 Weight at Last Visit: 256lb Weight Lost Since Last Visit: 4lb Weight Gained Since Last Visit: 0lb Starting Weight: 290lb Total Weight Loss (lbs): 38 lb (17.2 kg)   Body Composition  Body Fat %: 48.1 % Fat Mass (lbs): 121.4 lbs Muscle Mass (lbs): 124.2 lbs Total Body Water (lbs): 87 lbs Visceral Fat Rating : 14   Other Clinical Data Fasting: Yes Labs: No Today's Visit #: 13 Starting Date: 04/21/23     HPI  Chief Complaint: OBESITY  Pam Mckee is here to discuss her progress with her obesity treatment plan. She is on the the Category 3 Plan and states she is following her eating plan approximately 40 % of the time. She states she is exercising 10 minutes 5 days per week.   Interval History:  Since last office visit she has lost 4 pounds.  She is following the meal plan but not on a regular basis.  She sometimes skip lunch or dinner.  She feels that she does well during the week but gets off track track on the weekends especially with skipping meals and not drinking enough water.  She is drinking water daily.    Next goal weight:  230 lbs   Her highest weight was 298 lbs.   Planning to have surgery in July (will need to stop Mounjaro 2 weeks prior to surgery)  Pharmacotherapy for weight loss: She is not currently taking medications  for medical weight loss.     Previous pharmacotherapy for medical weight loss:  Ozempic   Bariatric surgery:  Patient has not had bariatric surgery  Pharmacotherapy for DMT2:   She is currently taking Mounjaro 15mg .  Denies side effects.   Last A1c was 6.1 She is not checking BS at home.   Episodes of  hypoglycemia: denies She is on an ARB and statin.  Last eye exam:  May 2024 She has tried Ozempic in the past.       Lab Results  Component Value Date   HGBA1C 6.1 (H) 10/29/2023   HGBA1C 6.6 (H) 04/21/2023   HGBA1C 6.4 (H) 07/17/2020   Lab Results  Component Value Date   MICROALBUR 80 02/08/2024   LDLCALC 80 10/29/2023   CREATININE 0.80 02/15/2024    Hypokalemia Took potassium for 3 days.    Low Vit B12 She is taking a PNV.  Took Vit B12 injections in the past.    History of anemia She is taking a PNV.  No history of iron infusions.  Last colonoscopy 04/05/21  Vit D deficiency  She is taking Vit D 50,000 IU every 2 weeks.  Denies side effects.  Denies nausea, vomiting or muscle weakness.    Lab Results  Component Value Date   VD25OH 59.5 10/29/2023   VD25OH 20.4 (L) 04/21/2023   VD25OH 18.5 (L) 07/17/2020     PHYSICAL EXAM:  Blood pressure 135/82, pulse 85, temperature 98.1 F (36.7 C), height 5\' 6"  (1.676 m), weight 252 lb (114.3 kg), SpO2 97%. Body mass index is 40.67 kg/m.  General: She is overweight, cooperative, alert, well developed, and in no acute distress. PSYCH: Has normal mood, affect and thought process.  Extremities: No edema.  Neurologic: No gross sensory or motor deficits. No tremors or fasciculations noted.    DIAGNOSTIC DATA REVIEWED:  BMET    Component Value Date/Time   NA 138 02/15/2024 0943   K 3.4 (L) 02/15/2024 0943   CL 98 02/15/2024 0943   CO2 23 02/15/2024 0943   GLUCOSE 105 (H) 02/15/2024 0943   GLUCOSE 86 09/09/2016 0837   BUN 14 02/15/2024 0943   CREATININE 0.80 02/15/2024 0943   CREATININE 0.84 09/09/2016 0837   CALCIUM 9.3 02/15/2024 0943   GFRNONAA 93 07/17/2020 0835   GFRNONAA >89 01/14/2016 1328   GFRAA 107 07/17/2020 0835   GFRAA >89 01/14/2016 1328   Lab Results  Component Value Date   HGBA1C 6.1 (H) 10/29/2023   HGBA1C 5.5 01/14/2016   Lab Results  Component Value Date   INSULIN 74.3 (H) 04/21/2023    Lab Results  Component Value Date   TSH 3.600 02/15/2024   CBC    Component Value Date/Time   WBC 9.0 03/08/2018 1158   WBC 11.2 (A) 11/24/2016 1311   WBC 7.6 11/19/2016 1337   RBC 4.63 03/08/2018 1158   RBC 5.22 11/24/2016 1311   RBC 4.47 11/19/2016 1337   HGB 11.7 03/08/2018 1158   HCT 37.4 03/08/2018 1158   PLT 353 03/08/2018 1158   MCV 81 03/08/2018 1158   MCH 25.3 (L) 03/08/2018 1158   MCH 25.2 (A) 11/24/2016 1311   MCH 24.4 (L) 11/19/2016 1337   MCHC 31.3 (L) 03/08/2018 1158   MCHC 34.0 11/24/2016 1311   MCHC 31.3 (L) 11/19/2016 1337   RDW 15.0 03/08/2018 1158   Iron Studies    Component Value Date/Time   IRON 30 (L) 07/12/2015 1826   TIBC 371 07/12/2015 1826   FERRITIN 82 03/08/2018 1158   IRONPCTSAT 8 (L) 07/12/2015 1826   Lipid Panel     Component Value Date/Time   CHOL 140 10/29/2023 0747   TRIG 95 10/29/2023 0747   HDL 42 10/29/2023 0747   CHOLHDL 5.3 (H) 07/17/2020 0835   CHOLHDL 4.3 09/09/2016 0825   VLDL 10 09/09/2016 0825   LDLCALC 80 10/29/2023 0747   Hepatic Function Panel     Component Value Date/Time   PROT 7.2 10/29/2023 0747   ALBUMIN 4.0 10/29/2023 0747   AST 14 10/29/2023 0747   ALT 12 10/29/2023 0747   ALKPHOS 96 10/29/2023 0747   BILITOT 0.4 10/29/2023 0747      Component Value Date/Time   TSH 3.600 02/15/2024 0943   TSH 4.120 02/08/2024 0915   Nutritional Lab Results  Component Value Date   VD25OH 59.5 10/29/2023   VD25OH 20.4 (L) 04/21/2023   VD25OH 18.5 (L) 07/17/2020     ASSESSMENT AND PLAN  TREATMENT PLAN FOR OBESITY:  Recommended Dietary Goals  Pam Mckee is currently in the action stage of change. As such, her goal is to continue weight management plan. She has agreed to the Category 3 Plan.  Behavioral Intervention  We discussed the following Behavioral Modification Strategies today: increasing lean protein intake to established goals, decreasing simple carbohydrates , increasing vegetables, increasing  fiber rich foods, increasing water intake , work on meal planning and preparation, continue to work on implementation of reduced calorie nutritional plan, and continue to work on maintaining a reduced calorie state, getting the recommended amount of protein, incorporating whole foods, making healthy choices, staying well hydrated and practicing mindfulness when eating..  Additional resources provided today: NA  Recommended Physical Activity Goals  Pam Mckee has been advised to work up to 150 minutes of moderate intensity aerobic activity a week and strengthening exercises 2-3 times per week for cardiovascular health, weight loss maintenance and preservation of muscle mass.   She has agreed to Think about enjoyable ways to increase daily physical activity and overcoming barriers to exercise, Increase physical activity in their day and reduce sedentary time (increase NEAT)., Increase the intensity, frequency or duration of strengthening exercises , and Increase the intensity, frequency or duration of aerobic exercises     ASSOCIATED CONDITIONS ADDRESSED TODAY  Action/Plan  Type 2 diabetes mellitus without complication, without long-term current use of insulin (HCC) -     Hemoglobin A1c -     Comprehensive metabolic panel -     Tirzepatide; Inject 15 mg into the skin once a week.  Dispense: 6 mL; Refill: 0  Good blood sugar control is important to decrease the likelihood of diabetic complications such as nephropathy, neuropathy, limb loss, blindness, coronary artery disease, and death. Intensive lifestyle modification including diet, exercise and weight loss are the first line of treatment for diabetes.    Will need to stop Mounjaro 2 weeks prior to surgery  Hypokalemia -     Comprehensive metabolic panel  Needs to forward labs to PCP and to continue to follow-up with PCP.  Iron deficiency anemia, unspecified iron deficiency anemia type -     CBC with Differential/Platelet -     Ferritin -      Iron  Vitamin D deficiency -     Vitamin D (Ergocalciferol); Take 1 capsule (50,000 Units total) by mouth every 14 (fourteen) days.  Dispense: 6 capsule; Refill: 0 -     VITAMIN D 25 Hydroxy (Vit-D Deficiency, Fractures)  Low Vitamin D level contributes to fatigue and are associated with obesity, breast, and colon cancer. She agrees to continue to take prescription Vitamin D @50 ,000 IU every week and will follow-up for routine testing of Vitamin D, at least 2-3 times per year to avoid over-replacement.   Low vitamin B12 level -     Vitamin B12  Medication management -     Comprehensive metabolic panel  Class 3 severe obesity due to excess calories with serious comorbidity and body mass index (BMI) of 40.0 to 44.9 in adult [facility])     Goals:  Increase exercising- add resistance training and core exercises Eat more vegetables Increase water intake    Return in about 3 months (around 06/02/2024).Marland Kitchen She was informed of the importance of frequent follow up visits to maximize her success with intensive lifestyle modifications for her multiple health conditions.   ATTESTASTION STATEMENTS:  Reviewed by clinician on day of visit: allergies, medications, problem list, medical history, surgical history, family history, social history, and previous encounter notes.     Theodis Sato. Secret Kristensen FNP-C

## 2024-03-03 ENCOUNTER — Telehealth: Payer: Self-pay | Admitting: Nurse Practitioner

## 2024-03-03 ENCOUNTER — Encounter (HOSPITAL_BASED_OUTPATIENT_CLINIC_OR_DEPARTMENT_OTHER): Payer: Self-pay | Admitting: Family Medicine

## 2024-03-03 ENCOUNTER — Other Ambulatory Visit (HOSPITAL_BASED_OUTPATIENT_CLINIC_OR_DEPARTMENT_OTHER)

## 2024-03-03 LAB — CBC WITH DIFFERENTIAL/PLATELET
Basophils Absolute: 0.1 10*3/uL (ref 0.0–0.2)
Basos: 1 %
EOS (ABSOLUTE): 0.2 10*3/uL (ref 0.0–0.4)
Eos: 2 %
Hematocrit: 40.3 % (ref 34.0–46.6)
Hemoglobin: 12.8 g/dL (ref 11.1–15.9)
Immature Grans (Abs): 0 10*3/uL (ref 0.0–0.1)
Immature Granulocytes: 0 %
Lymphocytes Absolute: 3.1 10*3/uL (ref 0.7–3.1)
Lymphs: 29 %
MCH: 25.3 pg — ABNORMAL LOW (ref 26.6–33.0)
MCHC: 31.8 g/dL (ref 31.5–35.7)
MCV: 80 fL (ref 79–97)
Monocytes Absolute: 0.4 10*3/uL (ref 0.1–0.9)
Monocytes: 4 %
Neutrophils Absolute: 6.8 10*3/uL (ref 1.4–7.0)
Neutrophils: 64 %
Platelets: 347 10*3/uL (ref 150–450)
RBC: 5.06 x10E6/uL (ref 3.77–5.28)
RDW: 13.5 % (ref 11.7–15.4)
WBC: 10.6 10*3/uL (ref 3.4–10.8)

## 2024-03-03 LAB — COMPREHENSIVE METABOLIC PANEL WITH GFR
ALT: 14 IU/L (ref 0–32)
AST: 21 IU/L (ref 0–40)
Albumin: 4.1 g/dL (ref 3.8–4.9)
Alkaline Phosphatase: 101 IU/L (ref 44–121)
BUN/Creatinine Ratio: 23 (ref 9–23)
BUN: 20 mg/dL (ref 6–24)
Bilirubin Total: 0.5 mg/dL (ref 0.0–1.2)
CO2: 24 mmol/L (ref 20–29)
Calcium: 9.5 mg/dL (ref 8.7–10.2)
Chloride: 98 mmol/L (ref 96–106)
Creatinine, Ser: 0.88 mg/dL (ref 0.57–1.00)
Globulin, Total: 3.2 g/dL (ref 1.5–4.5)
Glucose: 95 mg/dL (ref 70–99)
Potassium: 3.3 mmol/L — ABNORMAL LOW (ref 3.5–5.2)
Sodium: 140 mmol/L (ref 134–144)
Total Protein: 7.3 g/dL (ref 6.0–8.5)
eGFR: 80 mL/min/{1.73_m2} (ref >59)

## 2024-03-03 LAB — FERRITIN: Ferritin: 222 ng/mL — ABNORMAL HIGH (ref 15–150)

## 2024-03-03 LAB — IRON: Iron: 74 ug/dL (ref 27–159)

## 2024-03-03 LAB — HEMOGLOBIN A1C
Est. average glucose Bld gHb Est-mCnc: 114 mg/dL
Hgb A1c MFr Bld: 5.6 % (ref 4.8–5.6)

## 2024-03-03 LAB — VITAMIN D 25 HYDROXY (VIT D DEFICIENCY, FRACTURES): Vit D, 25-Hydroxy: 44.4 ng/mL (ref 30.0–100.0)

## 2024-03-03 LAB — VITAMIN B12: Vitamin B-12: 295 pg/mL (ref 232–1245)

## 2024-03-03 NOTE — Telephone Encounter (Signed)
 Please see mychart message sent by pt and advise.

## 2024-03-03 NOTE — Telephone Encounter (Signed)
 Labs discussed with patient.  She is to reach out to PCP to discuss treatment options for hypokalemia.  Patient verbalizes understanding.  I also sent a staff note to Jerre Simon FNP.

## 2024-03-07 ENCOUNTER — Other Ambulatory Visit (HOSPITAL_BASED_OUTPATIENT_CLINIC_OR_DEPARTMENT_OTHER): Payer: Self-pay | Admitting: Family Medicine

## 2024-03-07 ENCOUNTER — Other Ambulatory Visit: Payer: Self-pay

## 2024-03-07 DIAGNOSIS — E876 Hypokalemia: Secondary | ICD-10-CM

## 2024-03-07 MED ORDER — POTASSIUM CHLORIDE CRYS ER 10 MEQ PO TBCR
10.0000 meq | EXTENDED_RELEASE_TABLET | Freq: Two times a day (BID) | ORAL | 0 refills | Status: DC
  Filled 2024-03-07: qty 10, 5d supply, fill #0

## 2024-03-07 NOTE — Progress Notes (Signed)
 Please let patient know that her potassium is still low after replacement.  This is likely due to her hydrochlorothiazide use for blood pressure control.  We could consider putting her on potassium supplement daily to mitigate this as a chronic issue or we could consider a different blood pressure medication besides the HCTZ.  Please let me know which she would prefer.  I have sent in potassium replacement for her for the next 5 days to take 1 tablet twice a day.

## 2024-03-08 ENCOUNTER — Encounter (HOSPITAL_BASED_OUTPATIENT_CLINIC_OR_DEPARTMENT_OTHER): Payer: Self-pay | Admitting: *Deleted

## 2024-03-08 ENCOUNTER — Other Ambulatory Visit (HOSPITAL_BASED_OUTPATIENT_CLINIC_OR_DEPARTMENT_OTHER): Payer: Self-pay | Admitting: Family Medicine

## 2024-03-08 DIAGNOSIS — E876 Hypokalemia: Secondary | ICD-10-CM

## 2024-03-08 DIAGNOSIS — E1159 Type 2 diabetes mellitus with other circulatory complications: Secondary | ICD-10-CM

## 2024-03-08 MED ORDER — HYDROCHLOROTHIAZIDE 25 MG PO TABS
25.0000 mg | ORAL_TABLET | Freq: Every day | ORAL | 3 refills | Status: DC
  Filled 2024-03-08: qty 30, 30d supply, fill #0

## 2024-03-08 NOTE — Telephone Encounter (Signed)
 Sent pt a mychart message about her labwork. Pt sent response back stating that she would like to switch from the hydrochlorothiazide to see if that would help.  Routing to [employer].

## 2024-03-09 ENCOUNTER — Other Ambulatory Visit: Payer: Self-pay

## 2024-04-05 ENCOUNTER — Other Ambulatory Visit (HOSPITAL_BASED_OUTPATIENT_CLINIC_OR_DEPARTMENT_OTHER): Payer: Self-pay | Admitting: Family Medicine

## 2024-04-05 DIAGNOSIS — I152 Hypertension secondary to endocrine disorders: Secondary | ICD-10-CM

## 2024-04-05 MED ORDER — ROSUVASTATIN CALCIUM 10 MG PO TABS
10.0000 mg | ORAL_TABLET | Freq: Every day | ORAL | 2 refills | Status: AC
  Filled 2024-04-05: qty 90, 90d supply, fill #0
  Filled 2024-06-28: qty 90, 90d supply, fill #1
  Filled 2024-09-30: qty 90, 90d supply, fill #2

## 2024-04-05 MED ORDER — LOSARTAN POTASSIUM 100 MG PO TABS
100.0000 mg | ORAL_TABLET | Freq: Every day | ORAL | 2 refills | Status: AC
  Filled 2024-04-05: qty 90, 90d supply, fill #0
  Filled 2024-06-28: qty 90, 90d supply, fill #1
  Filled 2024-09-30: qty 90, 90d supply, fill #2

## 2024-04-05 MED ORDER — LEVOTHYROXINE SODIUM 112 MCG PO TABS
112.0000 ug | ORAL_TABLET | Freq: Every day | ORAL | 2 refills | Status: AC
  Filled 2024-04-05: qty 30, 30d supply, fill #0
  Filled 2024-06-28: qty 30, 30d supply, fill #1
  Filled 2024-08-29: qty 30, 30d supply, fill #2
  Filled 2024-09-30: qty 30, 30d supply, fill #3
  Filled 2024-11-16: qty 30, 30d supply, fill #4
  Filled 2024-12-20: qty 30, 30d supply, fill #5

## 2024-04-06 ENCOUNTER — Other Ambulatory Visit: Payer: Self-pay

## 2024-04-06 ENCOUNTER — Ambulatory Visit (INDEPENDENT_AMBULATORY_CARE_PROVIDER_SITE_OTHER): Admitting: *Deleted

## 2024-04-06 DIAGNOSIS — Z7985 Long-term (current) use of injectable non-insulin antidiabetic drugs: Secondary | ICD-10-CM

## 2024-04-06 DIAGNOSIS — E1159 Type 2 diabetes mellitus with other circulatory complications: Secondary | ICD-10-CM | POA: Diagnosis not present

## 2024-04-06 DIAGNOSIS — E876 Hypokalemia: Secondary | ICD-10-CM | POA: Diagnosis not present

## 2024-04-06 DIAGNOSIS — I152 Hypertension secondary to endocrine disorders: Secondary | ICD-10-CM | POA: Diagnosis not present

## 2024-04-06 NOTE — Progress Notes (Signed)
 Patient is in office today for a nurse visit for Blood Pressure Check. Patient's BP was 120/69. Pt denies any symptoms.

## 2024-04-07 ENCOUNTER — Encounter (HOSPITAL_BASED_OUTPATIENT_CLINIC_OR_DEPARTMENT_OTHER): Payer: Self-pay | Admitting: Family Medicine

## 2024-04-07 ENCOUNTER — Other Ambulatory Visit: Payer: Self-pay

## 2024-04-07 LAB — BASIC METABOLIC PANEL WITH GFR
BUN/Creatinine Ratio: 18 (ref 9–23)
BUN: 14 mg/dL (ref 6–24)
CO2: 25 mmol/L (ref 20–29)
Calcium: 9.3 mg/dL (ref 8.7–10.2)
Chloride: 98 mmol/L (ref 96–106)
Creatinine, Ser: 0.78 mg/dL (ref 0.57–1.00)
Glucose: 96 mg/dL (ref 70–99)
Potassium: 3.4 mmol/L — ABNORMAL LOW (ref 3.5–5.2)
Sodium: 140 mmol/L (ref 134–144)
eGFR: 91 mL/min/{1.73_m2} (ref >59)

## 2024-04-07 NOTE — Progress Notes (Signed)
 Hi Pam Mckee,  Your potassium has improved but still remains mildly low (down by 0.1 from normal). Would you be open to trying a switch from hydrochlorothiazide  to Chlorthalidone ? Let me know. Your blood pressure looked great from the other day. We will monitor it with changing your medication too.

## 2024-04-08 ENCOUNTER — Other Ambulatory Visit: Payer: Self-pay

## 2024-04-08 ENCOUNTER — Other Ambulatory Visit (HOSPITAL_BASED_OUTPATIENT_CLINIC_OR_DEPARTMENT_OTHER): Payer: Self-pay | Admitting: Family Medicine

## 2024-04-08 DIAGNOSIS — E1159 Type 2 diabetes mellitus with other circulatory complications: Secondary | ICD-10-CM

## 2024-04-08 DIAGNOSIS — E876 Hypokalemia: Secondary | ICD-10-CM

## 2024-04-08 MED ORDER — POTASSIUM CHLORIDE CRYS ER 10 MEQ PO TBCR
10.0000 meq | EXTENDED_RELEASE_TABLET | Freq: Two times a day (BID) | ORAL | 0 refills | Status: DC
  Filled 2024-04-08: qty 30, 15d supply, fill #0

## 2024-04-08 MED ORDER — CHLORTHALIDONE 25 MG PO TABS
12.5000 mg | ORAL_TABLET | Freq: Every day | ORAL | 3 refills | Status: AC
Start: 2024-04-08 — End: ?
  Filled 2024-04-08: qty 45, 90d supply, fill #0
  Filled 2024-06-28: qty 45, 90d supply, fill #1
  Filled 2024-09-30: qty 45, 90d supply, fill #2
  Filled 2024-12-24: qty 45, 90d supply, fill #3

## 2024-04-08 NOTE — Progress Notes (Signed)
 Please let her know I have sent in the Chlorthalidone  for her to start. I also refilled her potassium to take 1 tablet by mouth twice daily for 3 days. Please schedule lab and BP check repeat in 2 weeks.

## 2024-04-12 ENCOUNTER — Encounter: Payer: Self-pay | Admitting: Obstetrics and Gynecology

## 2024-04-12 ENCOUNTER — Ambulatory Visit (INDEPENDENT_AMBULATORY_CARE_PROVIDER_SITE_OTHER): Admitting: Obstetrics and Gynecology

## 2024-04-12 ENCOUNTER — Other Ambulatory Visit: Payer: Self-pay

## 2024-04-12 ENCOUNTER — Other Ambulatory Visit (HOSPITAL_COMMUNITY)
Admission: RE | Admit: 2024-04-12 | Discharge: 2024-04-12 | Disposition: A | Discharge status: Home / Self Care | Source: Ambulatory Visit | Attending: Obstetrics and Gynecology | Admitting: Obstetrics and Gynecology

## 2024-04-12 VITALS — BP 120/78 | HR 75

## 2024-04-12 DIAGNOSIS — N950 Postmenopausal bleeding: Secondary | ICD-10-CM

## 2024-04-12 DIAGNOSIS — D219 Benign neoplasm of connective and other soft tissue, unspecified: Secondary | ICD-10-CM

## 2024-04-12 DIAGNOSIS — N898 Other specified noninflammatory disorders of vagina: Secondary | ICD-10-CM | POA: Diagnosis not present

## 2024-04-12 DIAGNOSIS — G4730 Sleep apnea, unspecified: Secondary | ICD-10-CM | POA: Diagnosis not present

## 2024-04-12 DIAGNOSIS — N83209 Unspecified ovarian cyst, unspecified side: Secondary | ICD-10-CM | POA: Diagnosis not present

## 2024-04-12 MED ORDER — METRONIDAZOLE 500 MG PO TABS
500.0000 mg | ORAL_TABLET | Freq: Two times a day (BID) | ORAL | 0 refills | Status: DC
  Filled 2024-04-12: qty 14, 7d supply, fill #0

## 2024-04-12 NOTE — Progress Notes (Signed)
 Patient presents for EMB prior to having the [facility]. She is wondering if the IUD will be removed along with fibroids at the time of surgery and has more questions on the procedure. She is worried about having heavy bleeding again if the IUD is removed. She does not want to worry about the bleeding, fibroids or risk for cancer in the future as well.  She is sure she wants to proceed with the [facility] in July  There were no vitals taken for this visit.      53 y.o. y.o. female here for TV US  results with left ovarian mass.  She denies any heavy vaginal bleeding.   She has had the mirena  IUD since 2017. 06/17/16 Dr. Connell Degree TV US  with multiple fibroids, no adnexal masses identified  Narrative & Impression  CLINICAL DATA:  Fibroids, IUD.   EXAM: TRANSABDOMINAL AND TRANSVAGINAL ULTRASOUND OF PELVIS   TECHNIQUE: Both transabdominal and transvaginal ultrasound examinations of the pelvis were performed. Transabdominal technique was performed for global imaging of the pelvis including uterus, ovaries, adnexal regions, and pelvic cul-de-sac. It was necessary to proceed with endovaginal exam following the transabdominal exam to visualize the uterus, endometrium, ovaries and adnexal regions.   COMPARISON:  06/17/2016.   FINDINGS: Uterus   Measurements: 8.6 x 6.1 x 9.2 cm = volume: 253 mL. Intrauterine contraceptive device is in appropriate position. Multiple hypoechoic masses in the uterus, measuring up to 4.5 x 3.3 x 3.4 cm.   Endometrium   Thickness: 5 mm, within normal limits for a perimenopausal female in the absence of abnormal uterine bleeding. No focal abnormality visualized.   Right ovary   Not visualized.   Left ovary   Measurements: 4.7 x 3.8 x 4.0 cm = volume: 38 mL. Image quality is limited by discomfort due to vaginal probe and body habitus. There is a 3.4 x 3.0 x 3.0 cm hypoechoic mass or anechoic mass with homogeneous low level internal echoes. Associated increased  through transmission.   Other findings   Trace pelvic free fluid.   IMPRESSION: 1. Difficult exam due to retroverted uterus, uterine fibroids, body habitus and discomfort. 2. Left ovarian hypoechoic mass versus anechoic mass with homogeneous low level internal echoes. Associated increased through transmission. Initial follow-up pelvic ultrasound in 6-12 weeks is recommended as malignancy cannot be excluded. These results will be called to the ordering clinician or representative by the Radiologist Assistant, and communication documented in the PACS or [employer]. 3. Uterine fibroids. 4. Intrauterine contraceptive device in appropriate position.     Electronically Signed   By: Shearon Denis M.D.   On: 10/22/2023 08:56    She has lost 30lbs with wellness program at [facility] Maintenance: Pap:   08/04/2018 WNL NEG HPV,05/18/2015 WNL  History of abnormal Pap:  no MMG:  09/03/21 density B Bi-rads 1 neg  BMD:   n/a Colonoscopy: 04/05/21 polyps follow up 7 years  TDaP:  2013 Gardasil: n/a  There were no vitals taken for this visit.     Component Value Date/Time   DIAGPAP  09/21/2023 1729    - Negative for intraepithelial lesion or malignancy (NILM)   DIAGPAP  08/04/2018 0000    NEGATIVE FOR INTRAEPITHELIAL LESIONS OR MALIGNANCY.   HPVHIGH Negative 09/21/2023 1729   ADEQPAP  09/21/2023 1729    Satisfactory for evaluation; transformation zone component PRESENT.   ADEQPAP  08/04/2018 0000    Satisfactory for evaluation  endocervical/transformation zone component PRESENT.    GYN HISTORY:  Component Value Date/Time   DIAGPAP  09/21/2023 1729    - Negative for intraepithelial lesion or malignancy (NILM)   DIAGPAP  08/04/2018 0000    NEGATIVE FOR INTRAEPITHELIAL LESIONS OR MALIGNANCY.   HPVHIGH Negative 09/21/2023 1729   ADEQPAP  09/21/2023 1729    Satisfactory for evaluation; transformation zone component PRESENT.   ADEQPAP  08/04/2018 0000     Satisfactory for evaluation  endocervical/transformation zone component PRESENT.    OB History  Gravida Para Term Preterm AB Living  2 1 1  0 1 1  SAB IAB Ectopic Multiple Live Births  0 1 0 0     # Outcome Date GA Lbr Len/2nd Weight Sex Type Anes PTL Lv  2 IAB           1 Term             Past Medical History:  Diagnosis Date   Anemia    Back pain    Elevated lipids    High cholesterol    Hypertension    Olecranon bursitis of right elbow 02/15/2015   Suspect this was an acute gout flare triggered by HCTZ since she had no trauma    OSA on CPAP    Prediabetes    Primary osteoarthritis of left knee 05/18/2015   Nonweightbearing x-rays revealed mild degenerative changes.  Referred to [facility]  - saw Jaclyn , PA-C    Seizures [facility])    Thyroid  disease    hypo   Torn LEFT medial meniscus 04/2015   Left knee MRI confirmed but no instability sxs. Seen at [facility] 04/2016 to review treatment plan. sxs responded to cortisone injection summer 2016 and 04/2016 so continue prn. Consider arthroscopy if sxs become unresponsive.    Past Surgical History:  Procedure Laterality Date   COLONOSCOPY WITH PROPOFOL  N/A 04/05/2021   Procedure: COLONOSCOPY WITH PROPOFOL ;  Surgeon: Alvis Jourdain, MD;  Location: [facility]COPY;  Service: Endoscopy;  Laterality: N/A;   INTRAUTERINE DEVICE (IUD) INSERTION  06/2016   POLYPECTOMY  04/05/2021   Procedure: POLYPECTOMY;  Surgeon: Alvis Jourdain, MD;  Location: [facility] ENDOSCOPY;  Service: Endoscopy;;    Current Outpatient Medications on File Prior to Visit  Medication Sig Dispense Refill   chlorthalidone  (HYGROTON ) 25 MG tablet Take 0.5 tablets (12.5 mg total) by mouth daily. 60 tablet 3   levonorgestrel  (MIRENA ) 20 MCG/24HR IUD 1 each by Intrauterine route once.     levothyroxine  (SYNTHROID ) 112 MCG tablet Take 1 tablet (112 mcg total) by mouth daily on an empty stomach. No food or drink for 30 mins after taking medication. 90 tablet 2   losartan   (COZAAR ) 100 MG tablet Take 1 tablet (100 mg total) by mouth daily. 90 tablet 2   potassium chloride  (KLOR-CON  M) 10 MEQ tablet Take 1 tablet (10 mEq total) by mouth 2 (two) times daily for 3 days. 30 tablet 0   rosuvastatin  (CRESTOR ) 10 MG tablet Take 1 tablet (10 mg total) by mouth daily. 90 tablet 2   tirzepatide  (MOUNJARO ) 15 MG/0.5ML Pen Inject 15 mg into the skin once a week. 6 mL 0   triamcinolone  ointment (KENALOG ) 0.1 % Apply 1 Application topically 2 (two) times daily. 30 g 0   Vitamin D , Ergocalciferol , (DRISDOL ) 1.25 MG (50000 UNIT) CAPS capsule Take 1 capsule (50,000 Units total) by mouth every 14 (fourteen) days. 6 capsule 0   No current facility-administered medications on file prior to visit.    Social History   Socioeconomic History  Marital status: Married    Spouse name: Not on file   Number of children: 1   Years of education: HS   Highest education level: 12th grade  Occupational History   Occupation: Arboriculturist: [place]  Tobacco Use   Smoking status: Never   Smokeless tobacco: Never  Vaping Use   Vaping status: Never Used  Substance and Sexual Activity   Alcohol use: No    Alcohol/week: 0.0 standard drinks of alcohol   Drug use: No   Sexual activity: Yes    Partners: Male    Birth control/protection: I.U.D.  Other Topics Concern   Not on file  Social History Narrative      Drinks about 2-3 cups of coffee/tea a day. Education: [employer].   Social Drivers of Corporate investment banker Strain: Low Risk  (02/04/2024)   Overall Financial Resource Strain (CARDIA)    Difficulty of Paying Living Expenses: Not very hard  Food Insecurity: Food Insecurity Present (02/04/2024)   Hunger Vital Sign    Worried About Running Out of Food in the Last Year: Sometimes true    Ran Out of Food in the Last Year: Sometimes true  Transportation Needs: No Transportation Needs (02/04/2024)   PRAPARE - Administrator, Civil Service  (Medical): No    Lack of Transportation (Non-Medical): No  Physical Activity: Insufficiently Active (02/04/2024)   Exercise Vital Sign    Days of Exercise per Week: 4 days    Minutes of Exercise per Session: 10 min  Stress: No Stress Concern Present (02/04/2024)   [employer] of Occupational Health - Occupational Stress Questionnaire    Feeling of Stress : Not at all  Social Connections: Moderately Integrated (02/04/2024)   Social Connection and Isolation Panel [NHANES]    Frequency of Communication with Friends and Family: More than three times a week    Frequency of Social Gatherings with Friends and Family: More than three times a week    Attends Religious Services: 1 to 4 times per year    Active Member of [employer] or Organizations: No    Attends Engineer, structural: Not on file    Marital Status: Married  Catering manager Violence: Not At Risk (02/08/2024)   Humiliation, Afraid, Rape, and Kick questionnaire    Fear of Current or Ex-Partner: No    Emotionally Abused: No    Physically Abused: No    Sexually Abused: No    Family History  Problem Relation Age of Onset   Hypertension Mother    CVA Mother        occured while on lovenox   Stroke Mother    Renal Disease Father    Kidney disease Father    Hypertension Brother    Aneurysm Brother    Diabetes Brother    Diabetes Paternal Grandfather    Breast cancer Neg Hx      No Known Allergies    Patient's last menstrual period was No LMP recorded. (Menstrual status: IUD)..            Review of Systems Alls systems reviewed and are negative.        ENDOMETRIAL BIOPSY       PROCEDURE: EMB Consent obtained for the procedure. Time out performed.  A bivalve speculum was placed in the vagina.  The cervix was grasped with a single tooth tenaculum.  Pipelle was inserted and rotated twice.  Adequate specimen was obtained and sent  to pathology.  All instruments were removed.  Patient tolerated the procedure  well.  To notify patient of the results.      A:         Symptomatic fibroids, mirena  IUD in place, left ovarian mass with normal ova1 desires RLH, LSO, right salpingectomy, cystosocpy presenting for preop EMB                             P:       EMB collected Patient is ready to schedule surgery for July.  To notify Mallie Seal Sleep apnea: has not been tested in several years and not using a mask. Referral placed Stop monjaro one week prior to surgery   Reinaldo Caras

## 2024-04-14 ENCOUNTER — Encounter: Payer: Self-pay | Admitting: Obstetrics and Gynecology

## 2024-04-14 ENCOUNTER — Other Ambulatory Visit (HOSPITAL_COMMUNITY): Payer: Self-pay

## 2024-04-14 LAB — SURGICAL PATHOLOGY

## 2024-04-14 NOTE — Addendum Note (Signed)
 Addended by: Reinaldo Caras on: 04/14/2024 01:41 PM   Modules accepted: Orders, Level of Service

## 2024-04-18 ENCOUNTER — Encounter (HOSPITAL_COMMUNITY): Payer: Self-pay

## 2024-04-18 DIAGNOSIS — N950 Postmenopausal bleeding: Secondary | ICD-10-CM

## 2024-04-18 DIAGNOSIS — D219 Benign neoplasm of connective and other soft tissue, unspecified: Secondary | ICD-10-CM

## 2024-04-18 DIAGNOSIS — N921 Excessive and frequent menstruation with irregular cycle: Secondary | ICD-10-CM

## 2024-04-25 ENCOUNTER — Ambulatory Visit (HOSPITAL_BASED_OUTPATIENT_CLINIC_OR_DEPARTMENT_OTHER): Admitting: *Deleted

## 2024-04-25 ENCOUNTER — Other Ambulatory Visit (HOSPITAL_BASED_OUTPATIENT_CLINIC_OR_DEPARTMENT_OTHER): Payer: Self-pay | Admitting: Family Medicine

## 2024-04-25 DIAGNOSIS — E876 Hypokalemia: Secondary | ICD-10-CM | POA: Diagnosis not present

## 2024-04-25 LAB — BASIC METABOLIC PANEL WITH GFR
BUN/Creatinine Ratio: 23 (ref 9–23)
BUN: 20 mg/dL (ref 6–24)
CO2: 24 mmol/L (ref 20–29)
Calcium: 9.5 mg/dL (ref 8.7–10.2)
Chloride: 101 mmol/L (ref 96–106)
Creatinine, Ser: 0.87 mg/dL (ref 0.57–1.00)
Glucose: 97 mg/dL (ref 70–99)
Potassium: 3.7 mmol/L (ref 3.5–5.2)
Sodium: 140 mmol/L (ref 134–144)
eGFR: 80 mL/min/{1.73_m2} (ref >59)

## 2024-04-25 NOTE — Progress Notes (Signed)
 Patient is in office today for a nurse visit for Blood Pressure Check. Patient blood pressure was 111/73, Patient No chest pain, No shortness of breath, No dyspnea on exertion, No orthopnea, No paroxysmal nocturnal dyspnea, No edema, No palpitations, No syncope

## 2024-04-26 ENCOUNTER — Ambulatory Visit (HOSPITAL_BASED_OUTPATIENT_CLINIC_OR_DEPARTMENT_OTHER): Payer: Self-pay | Admitting: Family Medicine

## 2024-04-26 NOTE — Progress Notes (Signed)
 Hi [name], Your potassium has returned to baseline.  I believe the chlorthalidone  will work much better for you compared to the HCTZ.  Your blood pressure looked great as well.  We will plan to follow-up as scheduled.

## 2024-05-25 ENCOUNTER — Other Ambulatory Visit: Payer: Self-pay

## 2024-06-01 ENCOUNTER — Other Ambulatory Visit: Payer: Self-pay

## 2024-06-01 ENCOUNTER — Encounter: Payer: Self-pay | Admitting: Nurse Practitioner

## 2024-06-01 ENCOUNTER — Ambulatory Visit: Admitting: Nurse Practitioner

## 2024-06-01 ENCOUNTER — Other Ambulatory Visit (HOSPITAL_COMMUNITY): Payer: Self-pay

## 2024-06-01 VITALS — BP 135/85 | HR 75 | Temp 98.4°F | Ht 66.0 in | Wt 250.0 lb

## 2024-06-01 DIAGNOSIS — Z7985 Long-term (current) use of injectable non-insulin antidiabetic drugs: Secondary | ICD-10-CM

## 2024-06-01 DIAGNOSIS — E119 Type 2 diabetes mellitus without complications: Secondary | ICD-10-CM | POA: Diagnosis not present

## 2024-06-01 DIAGNOSIS — E559 Vitamin D deficiency, unspecified: Secondary | ICD-10-CM | POA: Diagnosis not present

## 2024-06-01 DIAGNOSIS — Z6841 Body Mass Index (BMI) 40.0 and over, adult: Secondary | ICD-10-CM

## 2024-06-01 DIAGNOSIS — E66813 Obesity, class 3: Secondary | ICD-10-CM | POA: Diagnosis not present

## 2024-06-01 DIAGNOSIS — E876 Hypokalemia: Secondary | ICD-10-CM | POA: Diagnosis not present

## 2024-06-01 MED ORDER — VITAMIN D (ERGOCALCIFEROL) 1.25 MG (50000 UNIT) PO CAPS
50000.0000 [IU] | ORAL_CAPSULE | ORAL | 0 refills | Status: DC
  Filled 2024-06-01 – 2024-07-15 (×2): qty 6, 84d supply, fill #0

## 2024-06-01 MED ORDER — TIRZEPATIDE 15 MG/0.5ML ~~LOC~~ SOAJ
15.0000 mg | SUBCUTANEOUS | 0 refills | Status: DC
  Filled 2024-06-01: qty 6, 84d supply, fill #0

## 2024-06-01 NOTE — Progress Notes (Signed)
 Office: [phone]  /  Fax: [phone]  WEIGHT SUMMARY AND BIOMETRICS  Weight Lost Since Last Visit: 2lb  Weight Gained Since Last Visit: 0lb   Vitals Temp: 98.4 F (36.9 C) BP: 135/85 Pulse Rate: 75 SpO2: 98 %   Anthropometric Measurements Height: 5' 6 (1.676 m) Weight: 250 lb (113.4 kg) BMI (Calculated): 40.37 Weight at Last Visit: 252lb Weight Lost Since Last Visit: 2lb Weight Gained Since Last Visit: 0lb Starting Weight: 290lb Total Weight Loss (lbs): 40 lb (18.1 kg)   Body Composition  Body Fat %: 48.7 % Fat Mass (lbs): 122.2 lbs Muscle Mass (lbs): 122.2 lbs Total Body Water (lbs): 92.4 lbs Visceral Fat Rating : 15   Other Clinical Data Fasting: Yes Labs: No Today's Visit #: 14 Starting Date: 04/21/23     HPI  Chief Complaint: OBESITY  Darren is here to discuss her progress with her obesity treatment plan. She is on the the Category 3 Plan and states she is following her eating plan approximately 50 % of the time. She states she is exercising 10 minutes 5 days per week.   Interval History:  Since last office visit she has lost 2 pounds.  She is not skipping meals.  She is averaging around 50 grams of protein daily.  She is drinking water daily.  She is walking 5 days per week.    Next goal weight:  230 lbs   Her highest weight was 298 lbs.    Planning to have surgery in July (will need to stop Mounjaro  2 weeks prior to surgery)  She has lost a total of 40 pounds since her initial visit on 03/24/2023   Pharmacotherapy for weight loss: She is not currently taking medications  for medical weight loss.     Previous pharmacotherapy for medical weight loss:  Ozempic    Bariatric surgery:  Patient has not had bariatric surgery  Pharmacotherapy for DMT2:  She is currently taking Mounjaro  15mg .  Denies side effects.   Last A1c was 5.6 on 03/02/24 She is not checking BS at home.   Episodes of hypoglycemia: denies She is on an ARB and statin.   Last eye exam:  May 2024-needs to schedule She has tried Ozempic  in the past   Lab Results  Component Value Date   HGBA1C 5.6 03/02/2024   HGBA1C 6.1 (H) 10/29/2023   HGBA1C 6.6 (H) 04/21/2023   Lab Results  Component Value Date   MICROALBUR 80 02/08/2024   LDLCALC 80 10/29/2023   CREATININE 0.87 04/25/2024     Vit D deficiency  She is taking Vit D 50,000 IU every 2 weeks.  Denies side effects.  Denies nausea, vomiting or muscle weakness.    Lab Results  Component Value Date   VD25OH 44.4 03/02/2024   VD25OH 59.5 10/29/2023   VD25OH 20.4 (L) 04/21/2023     Hypokalemia She has been seeing her PCP on a regular basis.  Has been taking potassium as directed.  She is currently not potassium.  Last BMP was on 04/25/24 and potassium was 3.7.  PHYSICAL EXAM:  Blood pressure 135/85, pulse 75, temperature 98.4 F (36.9 C), height 5' 6 (1.676 m), weight 250 lb (113.4 kg), SpO2 98%. Body mass index is 40.35 kg/m.  General: She is overweight, cooperative, alert, well developed, and in no acute distress. PSYCH: Has normal mood, affect and thought process.   Extremities: No edema.  Neurologic: No gross sensory or motor deficits. No tremors or fasciculations noted.  DIAGNOSTIC DATA REVIEWED:  BMET    Component Value Date/Time   NA 140 04/25/2024 0857   K 3.7 04/25/2024 0857   CL 101 04/25/2024 0857   CO2 24 04/25/2024 0857   GLUCOSE 97 04/25/2024 0857   GLUCOSE 86 09/09/2016 0837   BUN 20 04/25/2024 0857   CREATININE 0.87 04/25/2024 0857   CREATININE 0.84 09/09/2016 0837   CALCIUM  9.5 04/25/2024 0857   GFRNONAA 93 07/17/2020 0835   GFRNONAA >89 01/14/2016 1328   GFRAA 107 07/17/2020 0835   GFRAA >89 01/14/2016 1328   Lab Results  Component Value Date   HGBA1C 5.6 03/02/2024   HGBA1C 5.5 01/14/2016   Lab Results  Component Value Date   INSULIN  74.3 (H) 04/21/2023   Lab Results  Component Value Date   TSH 3.600 02/15/2024   CBC    Component Value Date/Time    WBC 10.6 03/02/2024 0751   WBC 11.2 (A) 11/24/2016 1311   WBC 7.6 11/19/2016 1337   RBC 5.06 03/02/2024 0751   RBC 5.22 11/24/2016 1311   RBC 4.47 11/19/2016 1337   HGB 12.8 03/02/2024 0751   HCT 40.3 03/02/2024 0751   PLT 347 03/02/2024 0751   MCV 80 03/02/2024 0751   MCH 25.3 (L) 03/02/2024 0751   MCH 25.2 (A) 11/24/2016 1311   MCH 24.4 (L) 11/19/2016 1337   MCHC 31.8 03/02/2024 0751   MCHC 34.0 11/24/2016 1311   MCHC 31.3 (L) 11/19/2016 1337   RDW 13.5 03/02/2024 0751   Iron  Studies    Component Value Date/Time   IRON  74 03/02/2024 0751   TIBC 371 07/12/2015 1826   FERRITIN 222 (H) 03/02/2024 0751   IRONPCTSAT 8 (L) 07/12/2015 1826   Lipid Panel     Component Value Date/Time   CHOL 140 10/29/2023 0747   TRIG 95 10/29/2023 0747   HDL 42 10/29/2023 0747   CHOLHDL 5.3 (H) 07/17/2020 0835   CHOLHDL 4.3 09/09/2016 0825   VLDL 10 09/09/2016 0825   LDLCALC 80 10/29/2023 0747   Hepatic Function Panel     Component Value Date/Time   PROT 7.3 03/02/2024 0751   ALBUMIN 4.1 03/02/2024 0751   AST 21 03/02/2024 0751   ALT 14 03/02/2024 0751   ALKPHOS 101 03/02/2024 0751   BILITOT 0.5 03/02/2024 0751      Component Value Date/Time   TSH 3.600 02/15/2024 0943   TSH 4.120 02/08/2024 0915   Nutritional Lab Results  Component Value Date   VD25OH 44.4 03/02/2024   VD25OH 59.5 10/29/2023   VD25OH 20.4 (L) 04/21/2023     ASSESSMENT AND PLAN  TREATMENT PLAN FOR OBESITY:  Recommended Dietary Goals  Taneika is currently in the action stage of change. As such, her goal is to continue weight management plan. She has agreed to keeping a food journal and adhering to recommended goals of 1500 calories and 100+ grams of  protein.  I have asked her to track and send me her macros next week to review.  Discussed the importance of meeting protein and calories goals.  Her muscle mass is decreasing and her body fat % is increasing.    Behavioral Intervention  We discussed the  following Behavioral Modification Strategies today: increasing lean protein intake to established goals, increasing vegetables, increasing fiber rich foods, avoiding skipping meals, increasing water intake , work on tracking and journaling calories using tracking application, and continue to work on maintaining a reduced calorie state, getting the recommended amount of protein, incorporating whole foods,  making healthy choices, staying well hydrated and practicing mindfulness when eating..  Additional resources provided today: NA  Recommended Physical Activity Goals  Yaret has been advised to work up to 150 minutes of moderate intensity aerobic activity a week and strengthening exercises 2-3 times per week for cardiovascular health, weight loss maintenance and preservation of muscle mass.   She has agreed to Think about enjoyable ways to increase daily physical activity and overcoming barriers to exercise, Increase physical activity in their day and reduce sedentary time (increase NEAT)., and continue to gradually increase the amount and intensity of exercise routine    ASSOCIATED CONDITIONS ADDRESSED TODAY  Action/Plan  Type 2 diabetes mellitus without complication, without long-term current use of insulin  (HCC) -     Tirzepatide ; Inject 15 mg into the skin once a week.  Dispense: 6 mL; Refill: 0.  Side effects discussed  Vitamin D  deficiency -     Vitamin D  (Ergocalciferol ); Take 1 capsule (50,000 Units total) by mouth every 14 (fourteen) days.  Dispense: 6 capsule; Refill: 0  Hypokalemia Continue follow-up with PCP.  Class 3 severe obesity due to excess calories with serious comorbidity and body mass index (BMI) of 40.0 to 44.9 in adult     Goals Increase protein intake Track macros Increase water intake Add in resistance training    Return in about 3 months (around 09/01/2024).Pam Mckee She was informed of the importance of frequent follow up visits to maximize her success with  intensive lifestyle modifications for her multiple health conditions.   ATTESTASTION STATEMENTS:  Reviewed by clinician on day of visit: allergies, medications, problem list, medical history, surgical history, family history, social history, and previous encounter notes.    Corean SAUNDERS. Treva Huyett FNP-C

## 2024-06-03 ENCOUNTER — Other Ambulatory Visit: Payer: Self-pay

## 2024-06-14 ENCOUNTER — Other Ambulatory Visit: Payer: Self-pay

## 2024-06-28 ENCOUNTER — Other Ambulatory Visit: Payer: Self-pay

## 2024-07-04 ENCOUNTER — Encounter (HOSPITAL_BASED_OUTPATIENT_CLINIC_OR_DEPARTMENT_OTHER): Payer: Self-pay | Admitting: Family Medicine

## 2024-07-04 ENCOUNTER — Other Ambulatory Visit: Payer: Self-pay

## 2024-07-06 ENCOUNTER — Ambulatory Visit (HOSPITAL_BASED_OUTPATIENT_CLINIC_OR_DEPARTMENT_OTHER): Payer: 59 | Admitting: Family Medicine

## 2024-07-07 ENCOUNTER — Ambulatory Visit (HOSPITAL_BASED_OUTPATIENT_CLINIC_OR_DEPARTMENT_OTHER): Admitting: Family Medicine

## 2024-07-07 ENCOUNTER — Ambulatory Visit (INDEPENDENT_AMBULATORY_CARE_PROVIDER_SITE_OTHER)

## 2024-07-07 ENCOUNTER — Encounter (HOSPITAL_BASED_OUTPATIENT_CLINIC_OR_DEPARTMENT_OTHER): Payer: Self-pay | Admitting: Family Medicine

## 2024-07-07 ENCOUNTER — Ambulatory Visit (HOSPITAL_BASED_OUTPATIENT_CLINIC_OR_DEPARTMENT_OTHER): Payer: Self-pay | Admitting: Family Medicine

## 2024-07-07 ENCOUNTER — Ambulatory Visit (HOSPITAL_BASED_OUTPATIENT_CLINIC_OR_DEPARTMENT_OTHER)

## 2024-07-07 VITALS — BP 130/82 | HR 78 | Temp 97.7°F | Ht 66.0 in | Wt 249.4 lb

## 2024-07-07 DIAGNOSIS — M25512 Pain in left shoulder: Secondary | ICD-10-CM

## 2024-07-07 DIAGNOSIS — H524 Presbyopia: Secondary | ICD-10-CM | POA: Diagnosis not present

## 2024-07-07 DIAGNOSIS — M79652 Pain in left thigh: Secondary | ICD-10-CM | POA: Diagnosis not present

## 2024-07-07 DIAGNOSIS — M25552 Pain in left hip: Secondary | ICD-10-CM | POA: Diagnosis not present

## 2024-07-07 DIAGNOSIS — E119 Type 2 diabetes mellitus without complications: Secondary | ICD-10-CM | POA: Diagnosis not present

## 2024-07-07 DIAGNOSIS — M1612 Unilateral primary osteoarthritis, left hip: Secondary | ICD-10-CM | POA: Diagnosis not present

## 2024-07-07 DIAGNOSIS — S4992XD Unspecified injury of left shoulder and upper arm, subsequent encounter: Secondary | ICD-10-CM | POA: Diagnosis not present

## 2024-07-07 NOTE — Progress Notes (Signed)
 Hi Pam Mckee,  Your xray is negative for a fracture. I am still concerned about a possible tear. Please use the sling, ice/heat and ibuprofen  or tylenol . I have sent in a referral to Dr. Genelle to get you in with Ortho. Let me know if you need anything else.

## 2024-07-07 NOTE — Progress Notes (Signed)
 Acute Care Office Visit  Subjective:   Pam Mckee 01-Feb-1971 07/07/2024  Chief Complaint  Patient presents with   Fall    Onset @ weeks ago Meds taken: Naproxen  and using Muscle Rub    HPI: Pt presents with left arm pain onset 2wks ago s/p fall. 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 She reports hearing a pop in her shoulder. Since then, she has been taking Naproxen , applying topical muscle rub, and icing her shoulder with relief, however states since the fall she hasn't been able to extend her arm at the shoulder joint. She reports re-injuring her arm 4days ago when she slipped down the stairs at her house, and put her LUE out again to catch herself. This time, she also injured the outer portion of her left thigh. She reports pain with walking.   The following portions of the patient's history were reviewed and updated as appropriate: past medical history, past surgical history, family history, social history, allergies, medications, and problem list.   Patient Active Problem List   Diagnosis Date Noted   Chronic midline low back pain with left-sided sciatica 07/08/2023   Lower extremity edema 06/03/2023   Hypertension associated with type 2 diabetes mellitus (HCC) 06/03/2023   BMI 45.0-49.9, adult (HCC) 04/21/2023   OSA (obstructive sleep apnea) 04/21/2023   Other hyperlipidemia 04/21/2023   Health care maintenance 04/21/2023   SOB (shortness of breath) on exertion 04/21/2023   Other fatigue 04/21/2023   Colon cancer screening 08/19/2021   Morbid obesity (HCC) 08/19/2021   Right knee pain 07/13/2020   Uterine leiomyoma 08/11/2019   Type 2 diabetes mellitus (HCC)    Elevated lipids    Nutritional counseling 04/26/2019   Simple obesity 02/19/2018   Family history of aneurysm 12/09/2015   Tear of meniscus, knee, medial 2017   Left hip pain 07/10/2015   Back pain with left-sided sciatica  07/10/2015   Hypothyroidism 06/06/2015   Nontoxic single thyroid  nodule 06/06/2015   Obstructive sleep apnea 05/18/2015   Breast calcifications on mammogram 05/18/2015   Menorrhagia with regular cycle 05/18/2015   Chronic pain of left knee 04/23/2015   Severe obesity (BMI >= 40) (HCC) 03/15/2015   Thyroid  activity decreased 03/15/2015   Vitamin D  deficiency 03/15/2015   Essential hypertension 02/15/2015   Past Medical History:  Diagnosis Date   Anemia    Back pain    Elevated lipids    High cholesterol    Hypertension    Olecranon bursitis of right elbow 02/15/2015   Suspect this was an acute gout flare triggered by HCTZ since she had no trauma    OSA on CPAP    Prediabetes    Primary osteoarthritis of left knee 05/18/2015   Nonweightbearing x-rays revealed mild degenerative changes.  Referred to [facility]  - saw Jaclyn , PA-C    Seizures [facility])    Thyroid  disease    hypo   Torn LEFT medial meniscus 04/2015   Left knee MRI confirmed but no instability sxs. Seen at [facility] 04/2016 to review treatment plan. sxs responded to cortisone injection summer 2016 and 04/2016 so continue prn. Consider arthroscopy if sxs become unresponsive.   Past Surgical History:  Procedure Laterality Date   COLONOSCOPY WITH PROPOFOL  N/A 04/05/2021   Procedure: COLONOSCOPY WITH PROPOFOL ;  Surgeon: Rollin Dover, MD;  Location: [facility];  Service: Endoscopy;  Laterality: N/A;   INTRAUTERINE  DEVICE (IUD) INSERTION  06/2016   POLYPECTOMY  04/05/2021   Procedure: POLYPECTOMY;  Surgeon: Rollin Dover, MD;  Location: [facility];  Service: Endoscopy;;   Family History  Problem Relation Age of Onset   Hypertension Mother    CVA Mother        occured while on lovenox   Stroke Mother    Renal Disease Father    Kidney disease Father    Hypertension Brother    Aneurysm Brother    Diabetes Brother    Diabetes Paternal Grandfather    Breast cancer Neg Hx    Outpatient Medications Prior  to Visit  Medication Sig Dispense Refill   chlorthalidone  (HYGROTON ) 25 MG tablet Take 0.5 tablets (12.5 mg total) by mouth daily. 60 tablet 3   levonorgestrel  (MIRENA ) 20 MCG/24HR IUD 1 each by Intrauterine route once.     levothyroxine  (SYNTHROID ) 112 MCG tablet Take 1 tablet (112 mcg total) by mouth daily on an empty stomach. No food or drink for 30 mins after taking medication. 90 tablet 2   losartan  (COZAAR ) 100 MG tablet Take 1 tablet (100 mg total) by mouth daily. 90 tablet 2   rosuvastatin  (CRESTOR ) 10 MG tablet Take 1 tablet (10 mg total) by mouth daily. 90 tablet 2   tirzepatide  (MOUNJARO ) 15 MG/0.5ML Pen Inject 15 mg into the skin once a week. 6 mL 0   triamcinolone  ointment (KENALOG ) 0.1 % Apply 1 Application topically 2 (two) times daily. 30 g 0   Vitamin D , Ergocalciferol , (DRISDOL ) 1.25 MG (50000 UNIT) CAPS capsule Take 1 capsule (50,000 Units total) by mouth every 14 (fourteen) days. 6 capsule 0   potassium chloride  (KLOR-CON  M) 10 MEQ tablet Take 1 tablet (10 mEq total) by mouth 2 (two) times daily for 3 days. (Patient not taking: Reported on 06/01/2024) 30 tablet 0   No facility-administered medications prior to visit.   No Known Allergies   ROS: A complete ROS was performed with pertinent positives/negatives noted in the HPI. The remainder of the ROS are negative.    Objective:   Today's Vitals   07/07/24 1052  BP: 130/82  Pulse: 78  Temp: 97.7 F (36.5 C)  TempSrc: Oral  SpO2: 100%  Weight: 249 lb 6.4 oz (113.1 kg)  Height: 5' 6 (1.676 m)  PainSc: 5   PainLoc: Arm    GENERAL: Well-appearing, in NAD. Well nourished.  SKIN: Pink, warm and dry. No rash, lesion, ulceration, or ecchymoses.  Head: Normocephalic. NECK: Trachea midline. Full ROM w/o pain or tenderness.  THROAT: Uvula midline. Mucous membranes pink and moist.  RESPIRATORY: Chest wall symmetrical. Respirations even and non-labored. CARDIAC: Peripheral pulses 2+ bilaterally.  MSK: Muscle tone and  strength appropriate for age. Pain with palpation over the left anterior and posterior shoulder, including over the humeral head. Limited ROM left shoudler. FROM left elbow, left wrist, left hip. Grip strength intact. No pain with palpation over the left hip or IT band. + apprehension, + empty can to LUE.  EXTREMITIES: Without clubbing, cyanosis, or edema.  NEUROLOGIC: No motor or sensory deficits. Steady, even gait. C2-C12 intact.  PSYCH/MENTAL STATUS: Alert, oriented x 3. Cooperative, appropriate mood and affect.   No results found for any visits on 07/07/24.    Assessment & Plan:  1. Left anterior shoulder pain (Primary) STAT XR ordered. Concern for possible ligament tear given difficulty with lifting LUE.  Encouraged pt to continue Naproxen  and muscle cream, advised pt she can add in Tylenol  while taking  Naproxen . Continue ice and heat therapy. Recommend using sling and referral placed to Ortho pending xrays.  - DG Shoulder Left - Ambulatory referral to Orthopedic Surgery  2. Left thigh pain Possible strain. Will obtain Left hip xray to rule out out fracture given injury and fall.  - DG HIP UNILAT W OR W/O PELVIS 2-3 VIEWS LEFT  Return if symptoms worsen or fail to improve.    Patient to reach out to office if new, worrisome, or unresolved symptoms arise or if no improvement in patient's condition. Patient verbalized understanding and is agreeable to treatment plan. All questions answered to patient's satisfaction.   Thersia Stark, FNP

## 2024-07-11 ENCOUNTER — Telehealth (INDEPENDENT_AMBULATORY_CARE_PROVIDER_SITE_OTHER): Payer: Self-pay | Admitting: *Deleted

## 2024-07-11 NOTE — Telephone Encounter (Signed)
 Pam Mckee (Key: [id number])  Your information has been sent to [employer].

## 2024-07-13 NOTE — Telephone Encounter (Signed)
 Message from Plan The request has been approved. The authorization is effective from 07/11/2024 to 07/10/2025, as long as the member is enrolled in their current health plan. The request was approved as submitted. This request is approved for 2mL per 28 days. A written notification letter will follow with additional details.. Authorization Expiration Date: July 10, 2025.   Patient notified via Mychart message.

## 2024-07-15 ENCOUNTER — Other Ambulatory Visit: Payer: Self-pay

## 2024-07-18 ENCOUNTER — Other Ambulatory Visit: Payer: Self-pay

## 2024-07-28 ENCOUNTER — Ambulatory Visit (HOSPITAL_BASED_OUTPATIENT_CLINIC_OR_DEPARTMENT_OTHER): Admitting: Orthopaedic Surgery

## 2024-08-31 ENCOUNTER — Encounter: Payer: Self-pay | Admitting: Nurse Practitioner

## 2024-08-31 ENCOUNTER — Other Ambulatory Visit: Payer: Self-pay

## 2024-08-31 ENCOUNTER — Ambulatory Visit: Admitting: Nurse Practitioner

## 2024-08-31 VITALS — BP 125/84 | HR 78 | Temp 97.9°F | Ht 66.0 in | Wt 241.0 lb

## 2024-08-31 DIAGNOSIS — E039 Hypothyroidism, unspecified: Secondary | ICD-10-CM | POA: Diagnosis not present

## 2024-08-31 DIAGNOSIS — E119 Type 2 diabetes mellitus without complications: Secondary | ICD-10-CM | POA: Diagnosis not present

## 2024-08-31 DIAGNOSIS — Z7985 Long-term (current) use of injectable non-insulin antidiabetic drugs: Secondary | ICD-10-CM | POA: Diagnosis not present

## 2024-08-31 DIAGNOSIS — E559 Vitamin D deficiency, unspecified: Secondary | ICD-10-CM | POA: Diagnosis not present

## 2024-08-31 DIAGNOSIS — E785 Hyperlipidemia, unspecified: Secondary | ICD-10-CM

## 2024-08-31 DIAGNOSIS — Z79899 Other long term (current) drug therapy: Secondary | ICD-10-CM

## 2024-08-31 DIAGNOSIS — E1169 Type 2 diabetes mellitus with other specified complication: Secondary | ICD-10-CM | POA: Diagnosis not present

## 2024-08-31 DIAGNOSIS — E66812 Obesity, class 2: Secondary | ICD-10-CM | POA: Diagnosis not present

## 2024-08-31 DIAGNOSIS — R5383 Other fatigue: Secondary | ICD-10-CM

## 2024-08-31 DIAGNOSIS — R7989 Other specified abnormal findings of blood chemistry: Secondary | ICD-10-CM

## 2024-08-31 MED ORDER — TIRZEPATIDE 15 MG/0.5ML ~~LOC~~ SOAJ
15.0000 mg | SUBCUTANEOUS | 0 refills | Status: DC
  Filled 2024-08-31: qty 6, 84d supply, fill #0

## 2024-08-31 NOTE — Telephone Encounter (Signed)
 Emailed to patient at [email or web address]

## 2024-08-31 NOTE — Progress Notes (Signed)
 Office: [phone]  /  Fax: [phone]  WEIGHT SUMMARY AND BIOMETRICS  Weight Lost Since Last Visit: 9lb  Weight Gained Since Last Visit: 0lb   Vitals Temp: 97.9 F (36.6 C) BP: 125/84 Pulse Rate: 78 SpO2: 100 %   Anthropometric Measurements Height: 5' 6 (1.676 m) Weight: 241 lb (109.3 kg) BMI (Calculated): 38.92 Weight at Last Visit: 250lb Weight Lost Since Last Visit: 9lb Weight Gained Since Last Visit: 0lb Starting Weight: 290lb Total Weight Loss (lbs): 49 lb (22.2 kg)   Body Composition  Body Fat %: 47.7 % Fat Mass (lbs): 115 lbs Muscle Mass (lbs): 119.6 lbs Total Body Water (lbs): 87.6 lbs Visceral Fat Rating : 14   Other Clinical Data Fasting: Yes Labs: No Today's Visit #: 15 Starting Date: 04/21/23     HPI  Chief Complaint: OBESITY  Alpha is here to discuss her progress with her obesity treatment plan. She is on the the Category 3 Plan and states she is following her eating plan approximately 50 % of the time. She states she is exercising 10 minutes 7 days per week.   Interval History:  Since last office visit on 06/01/24 she has lost 9 pounds.  She has postponed her hysterectomy to next year. She went to [place]  since her last visit.  She is not skipping meals and is eating a protein with each meal. She is struggling with fatigue.  She is working on increasing her water intake. She occ drinks a soda.  She is walking daily for 10 mins.    Her highest weight was 298 lbs.   Pharmacotherapy for weight loss: She is not currently taking medications  for medical weight loss.     Previous pharmacotherapy for medical weight loss:  Ozempic    Bariatric surgery:  Patient has not had bariatric surgery  Pharmacotherapy for DMT2:   She is currently taking Mounjaro  15mg .  Denies side effects.   Last A1c was 5.6 on 03/02/24 She is not checking BS at home.   Episodes of hypoglycemia: Denies  She is on an ARB and statin.  Last eye exam:  July 2025 She  has tried Ozempic  in the past   Mounjaro  approved from 07/11/2024 to 07/10/2025  Lab Results  Component Value Date   HGBA1C 5.6 03/02/2024   HGBA1C 6.1 (H) 10/29/2023   HGBA1C 6.6 (H) 04/21/2023   Lab Results  Component Value Date   MICROALBUR 80 02/08/2024   LDLCALC 80 10/29/2023   CREATININE 0.87 04/25/2024     Low Vit B12 She is not currently taking Vit B12.  She is taking a PNV.  Reports fatigue.    Vit D deficiency  She is taking Vit D 50,000 IU weekly.  Denies side effects.  Denies nausea, vomiting or muscle weakness. Reports fatigue.    Lab Results  Component Value Date   VD25OH 44.4 03/02/2024   VD25OH 59.5 10/29/2023   VD25OH 20.4 (L) 04/21/2023     Hyperlipidemia Medication(s): Crestor  10mg . Denies side effects.    Lab Results  Component Value Date   CHOL 140 10/29/2023   HDL 42 10/29/2023   LDLCALC 80 10/29/2023   TRIG 95 10/29/2023   CHOLHDL 5.3 (H) 07/17/2020   Lab Results  Component Value Date   ALT 14 03/02/2024   AST 21 03/02/2024   ALKPHOS 101 03/02/2024   BILITOT 0.5 03/02/2024   The 10-year ASCVD risk score (Arnett DK, et al., 2019) is: 8.5%   Values used to calculate the score:  Age: 53 years     Clincally relevant sex: Female     Is Non-Hispanic African American: Yes     Diabetic: Yes     Tobacco smoker: No     Systolic Blood Pressure: 125 mmHg     Is BP treated: Yes     HDL Cholesterol: 42 mg/dL     Total Cholesterol: 140 mg/dL    Hypothyroidism Stable.  Does not report symptoms associated with uncontrolled hypothyroidism.  Reports fatigue.  Medication(s): Levothyroxine  112 mcg daily. Lab Results  Component Value Date   TSH 3.600 02/15/2024    Elevated ferritin She is taking a PNV.    PHYSICAL EXAM:  Blood pressure 125/84, pulse 78, temperature 97.9 F (36.6 C), height 5' 6 (1.676 m), weight 241 lb (109.3 kg), SpO2 100%. Body mass index is 38.9 kg/m.  General: She is overweight, cooperative, alert, well  developed, and in no acute distress. PSYCH: Has normal mood, affect and thought process.   Extremities: No edema.  Neurologic: No gross sensory or motor deficits. No tremors or fasciculations noted.    DIAGNOSTIC DATA REVIEWED:  BMET    Component Value Date/Time   NA 140 04/25/2024 0857   K 3.7 04/25/2024 0857   CL 101 04/25/2024 0857   CO2 24 04/25/2024 0857   GLUCOSE 97 04/25/2024 0857   GLUCOSE 86 09/09/2016 0837   BUN 20 04/25/2024 0857   CREATININE 0.87 04/25/2024 0857   CREATININE 0.84 09/09/2016 0837   CALCIUM  9.5 04/25/2024 0857   GFRNONAA 93 07/17/2020 0835   GFRNONAA >89 01/14/2016 1328   GFRAA 107 07/17/2020 0835   GFRAA >89 01/14/2016 1328   Lab Results  Component Value Date   HGBA1C 5.6 03/02/2024   HGBA1C 5.5 01/14/2016   Lab Results  Component Value Date   INSULIN  74.3 (H) 04/21/2023   Lab Results  Component Value Date   TSH 3.600 02/15/2024   CBC    Component Value Date/Time   WBC 10.6 03/02/2024 0751   WBC 11.2 (A) 11/24/2016 1311   WBC 7.6 11/19/2016 1337   RBC 5.06 03/02/2024 0751   RBC 5.22 11/24/2016 1311   RBC 4.47 11/19/2016 1337   HGB 12.8 03/02/2024 0751   HCT 40.3 03/02/2024 0751   PLT 347 03/02/2024 0751   MCV 80 03/02/2024 0751   MCH 25.3 (L) 03/02/2024 0751   MCH 25.2 (A) 11/24/2016 1311   MCH 24.4 (L) 11/19/2016 1337   MCHC 31.8 03/02/2024 0751   MCHC 34.0 11/24/2016 1311   MCHC 31.3 (L) 11/19/2016 1337   RDW 13.5 03/02/2024 0751   Iron  Studies    Component Value Date/Time   IRON  74 03/02/2024 0751   TIBC 371 07/12/2015 1826   FERRITIN 222 (H) 03/02/2024 0751   IRONPCTSAT 8 (L) 07/12/2015 1826   Lipid Panel     Component Value Date/Time   CHOL 140 10/29/2023 0747   TRIG 95 10/29/2023 0747   HDL 42 10/29/2023 0747   CHOLHDL 5.3 (H) 07/17/2020 0835   CHOLHDL 4.3 09/09/2016 0825   VLDL 10 09/09/2016 0825   LDLCALC 80 10/29/2023 0747   Hepatic Function Panel     Component Value Date/Time   PROT 7.3 03/02/2024  0751   ALBUMIN 4.1 03/02/2024 0751   AST 21 03/02/2024 0751   ALT 14 03/02/2024 0751   ALKPHOS 101 03/02/2024 0751   BILITOT 0.5 03/02/2024 0751      Component Value Date/Time   TSH 3.600 02/15/2024 0943   TSH  4.120 02/08/2024 0915   Nutritional Lab Results  Component Value Date   VD25OH 44.4 03/02/2024   VD25OH 59.5 10/29/2023   VD25OH 20.4 (L) 04/21/2023     ASSESSMENT AND PLAN  TREATMENT PLAN FOR OBESITY:  Recommended Dietary Goals  Elonda is currently in the action stage of change. As such, her goal is to continue weight management plan. She has agreed to the Category 3 Plan.  Behavioral Intervention  We discussed the following Behavioral Modification Strategies today: increasing lean protein intake to established goals, decreasing simple carbohydrates , increasing vegetables, increasing fiber rich foods, increasing water intake , reading food labels , keeping healthy foods at home, continue to work on maintaining a reduced calorie state, getting the recommended amount of protein, incorporating whole foods, making healthy choices, staying well hydrated and practicing mindfulness when eating., and increase protein intake, fibrous foods (25 grams per day for women, 30 grams for men) and water to improve satiety and decrease hunger signals. .  Additional resources provided today: NA  Recommended Physical Activity Goals  Najai has been advised to work up to 150 minutes of moderate intensity aerobic activity a week and strengthening exercises 2-3 times per week for cardiovascular health, weight loss maintenance and preservation of muscle mass.   She has agreed to Think about enjoyable ways to increase daily physical activity and overcoming barriers to exercise, Increase physical activity in their day and reduce sedentary time (increase NEAT)., Continue to gradually increase the amount and intensity of exercise routine, Increase volume of physical activity to a goal of 240  minutes a week, and Combine aerobic and strengthening exercises for efficiency and improved cardiometabolic health.   ASSOCIATED CONDITIONS ADDRESSED TODAY  Action/Plan  Type 2 diabetes mellitus without complication, without long-term current use of insulin  (HCC) -     Hemoglobin A1c -     Continue Tirzepatide ; Inject 15 mg into the skin once a week.  Dispense: 6 mL; Refill: 0  Elevated ferritin -     Ferritin  Low vitamin B12 level -     Vitamin B12  Vitamin D  deficiency -     VITAMIN D  25 Hydroxy (Vit-D Deficiency, Fractures)  Hyperlipidemia associated with type 2 diabetes mellitus (HCC) -     Lipid Panel With LDL/HDL Ratio  Medication management -     Comprehensive metabolic panel with GFR -     TSH  Hypothyroidism, unspecified type -     TSH  Other fatigue -     TSH -     CBC with Differential/Platelet  Obesity, Class II, BMI 35-39.9         Return in about 3 months (around 11/30/2024).SABRA She was informed of the importance of frequent follow up visits to maximize her success with intensive lifestyle modifications for her multiple health conditions.   ATTESTASTION STATEMENTS:  Reviewed by clinician on day of visit: allergies, medications, problem list, medical history, surgical history, family history, social history, and previous encounter notes.    Corean SAUNDERS. Cordarrel Stiefel FNP-C

## 2024-09-01 ENCOUNTER — Other Ambulatory Visit: Payer: Self-pay

## 2024-09-01 LAB — CBC WITH DIFFERENTIAL/PLATELET
Basophils Absolute: 0.1 x10E3/uL (ref 0.0–0.2)
Basos: 1 %
EOS (ABSOLUTE): 0.3 x10E3/uL (ref 0.0–0.4)
Eos: 3 %
Hematocrit: 41.1 % (ref 34.0–46.6)
Hemoglobin: 12.5 g/dL (ref 11.1–15.9)
Immature Grans (Abs): 0 x10E3/uL (ref 0.0–0.1)
Immature Granulocytes: 0 %
Lymphocytes Absolute: 3.2 x10E3/uL — ABNORMAL HIGH (ref 0.7–3.1)
Lymphs: 31 %
MCH: 25.2 pg — ABNORMAL LOW (ref 26.6–33.0)
MCHC: 30.4 g/dL — ABNORMAL LOW (ref 31.5–35.7)
MCV: 83 fL (ref 79–97)
Monocytes Absolute: 0.5 x10E3/uL (ref 0.1–0.9)
Monocytes: 5 %
Neutrophils Absolute: 6.2 x10E3/uL (ref 1.4–7.0)
Neutrophils: 60 %
Platelets: 348 x10E3/uL (ref 150–450)
RBC: 4.97 x10E6/uL (ref 3.77–5.28)
RDW: 13.6 % (ref 11.7–15.4)
WBC: 10.3 x10E3/uL (ref 3.4–10.8)

## 2024-09-01 LAB — COMPREHENSIVE METABOLIC PANEL WITH GFR
ALT: 13 IU/L (ref 0–32)
AST: 14 IU/L (ref 0–40)
Albumin: 4.1 g/dL (ref 3.8–4.9)
Alkaline Phosphatase: 98 IU/L (ref 49–135)
BUN/Creatinine Ratio: 24 — ABNORMAL HIGH (ref 9–23)
BUN: 18 mg/dL (ref 6–24)
Bilirubin Total: 0.4 mg/dL (ref 0.0–1.2)
CO2: 24 mmol/L (ref 20–29)
Calcium: 9.2 mg/dL (ref 8.7–10.2)
Chloride: 100 mmol/L (ref 96–106)
Creatinine, Ser: 0.75 mg/dL (ref 0.57–1.00)
Globulin, Total: 2.7 g/dL (ref 1.5–4.5)
Glucose: 90 mg/dL (ref 70–99)
Potassium: 3.7 mmol/L (ref 3.5–5.2)
Sodium: 138 mmol/L (ref 134–144)
Total Protein: 6.8 g/dL (ref 6.0–8.5)
eGFR: 96 mL/min/1.73 (ref >59)

## 2024-09-01 LAB — LIPID PANEL WITH LDL/HDL RATIO
Cholesterol, Total: 151 mg/dL (ref 100–199)
HDL: 42 mg/dL (ref >39)
LDL Chol Calc (NIH): 90 mg/dL (ref 0–99)
LDL/HDL Ratio: 2.1 ratio (ref 0.0–3.2)
Triglycerides: 101 mg/dL (ref 0–149)
VLDL Cholesterol Cal: 19 mg/dL (ref 5–40)

## 2024-09-01 LAB — VITAMIN D 25 HYDROXY (VIT D DEFICIENCY, FRACTURES): Vit D, 25-Hydroxy: 33.3 ng/mL (ref 30.0–100.0)

## 2024-09-01 LAB — TSH: TSH: 3.72 u[IU]/mL (ref 0.450–4.500)

## 2024-09-01 LAB — FERRITIN: Ferritin: 160 ng/mL — ABNORMAL HIGH (ref 15–150)

## 2024-09-01 LAB — VITAMIN B12: Vitamin B-12: 254 pg/mL (ref 232–1245)

## 2024-09-01 LAB — HEMOGLOBIN A1C
Est. average glucose Bld gHb Est-mCnc: 117 mg/dL
Hgb A1c MFr Bld: 5.7 % — ABNORMAL HIGH (ref 4.8–5.6)

## 2024-09-20 ENCOUNTER — Other Ambulatory Visit: Payer: Self-pay | Admitting: Family Medicine

## 2024-09-20 DIAGNOSIS — Z1231 Encounter for screening mammogram for malignant neoplasm of breast: Secondary | ICD-10-CM

## 2024-10-03 ENCOUNTER — Other Ambulatory Visit: Payer: Self-pay

## 2024-10-10 ENCOUNTER — Encounter: Payer: Self-pay | Admitting: Radiology

## 2024-10-13 ENCOUNTER — Encounter: Payer: Self-pay | Admitting: Obstetrics and Gynecology

## 2024-10-13 ENCOUNTER — Ambulatory Visit
Admission: RE | Admit: 2024-10-13 | Discharge: 2024-10-13 | Disposition: A | Discharge status: Home / Self Care | Source: Ambulatory Visit | Attending: Family Medicine | Admitting: Family Medicine

## 2024-10-13 DIAGNOSIS — Z1231 Encounter for screening mammogram for malignant neoplasm of breast: Secondary | ICD-10-CM

## 2024-10-17 ENCOUNTER — Ambulatory Visit (HOSPITAL_BASED_OUTPATIENT_CLINIC_OR_DEPARTMENT_OTHER): Payer: Self-pay | Admitting: Family Medicine

## 2024-10-28 ENCOUNTER — Ambulatory Visit: Admitting: Obstetrics and Gynecology

## 2024-10-28 DIAGNOSIS — Z0289 Encounter for other administrative examinations: Secondary | ICD-10-CM

## 2024-11-02 ENCOUNTER — Encounter: Payer: Self-pay | Admitting: *Deleted

## 2024-11-11 ENCOUNTER — Encounter (HOSPITAL_COMMUNITY): Payer: Self-pay | Admitting: Obstetrics and Gynecology

## 2024-11-11 NOTE — Progress Notes (Signed)
 Surgical Instructions  Your procedure is scheduled on :  Friday September 12th, 2025 Report to [facility] Main Entrance A at 9:45 AM, then check in the Admitting office. Any questions or running late day of surgery :  call [phone]  Questions prior to your surgery day:  call [phone], Monday -- Friday 8am - 4pm. If you experience any cold or flu symptoms such as cough, fever, chills, shortness of breath, etc. between now and you scheduled surgery, please notify your surgeon office.   Remember: Do Not eat any food after midnight the night before surgery. You may have clear liquids from midnight night before surgery until 8:45 AM.   Clear liquids allowed are:  Water             Carbonated Beverages (diabetics choose diet or no sugar options)  Clear Tea ( no milk, no honey, etc.)  Black coffee ( NO MILK, CREAM OR POWDERED CREAMER OF ANY KIND)  Sport drinks, like Gatorade (diabetes choose diet or no sugar options)  NO clear liquid after 8:45 AM day of surgery.  This includes No water,  candy,  gum, and mints.  Take these medicines the morning of surgery with A SIPS OF WATER:  Levothyroxine    May take these medicines IF NEEDED:     One week prior to surgery, STOP taking any Aspirin (unless otherwise instructed by your surgeon) Aleve , Naproxen , ibuprofen , Motrin , Advil , Goody's, BC's, all herbal medications/ supplements, fish oil, and non-prescription vitamins.  Do NOT Smoke (tobacco/ vaping) and Do Not drink alcohol for 24 hours prior to your procedure.  For those patients that use a CPAP.  Please bring your CPAP/ mask/ tubing with them day of surgery . Anesthesia may ask recovery room nurse to use and if you stay the night you be asked to use it.  You will be asked to removed any contacts, glasses, piercing's, hearing aid's, dentures/ partials prior to surgery.  Please bring cases/ container/ solution/ etc., for them day of surgery.   Patients discharged the day of surgery will  NOT be allowed to drive home.  You must have responsible driver and caregiver to stay at home with you the next 24 hours.  SURGICAL WAITING ROOM VISITATION Patients may have no more than 2 support people in the waiting area - if more than 2 , these visitors may rotate.  Pre-op nurse will coordinate an appropriate time for 1 Adult support person, who may not rotate, to accompany patient in pre-op.  Aware some patients may have certain circumstances, speak to pre-op nurse day of surgery.  Children under the age 47 must have an adult with them who is not the patient and must remain in the main waiting area with an adult.  If the patient needs to stay at the hospital during part of their recovery, the visitor guidelines for inpatient rooms apply.  Please refer to the [facility] website for the visitor guidelines for any additional information.  If you received a COVID test during your pre-op visit it is requested that you wear a mask when out in public, stay away from anyone that may not be feeling well and notify your surgeon if you develop symptoms.  If you have been in contact with anyone that has tested positive in the past 10 days notify your surgeon.     [place] - Preparing for Surgery  Before surgery, you can play an important role. Because skin is not sterile, it needs to be as  free of germs as possible. You can reduce the number of germs on your skin by washing with CHG (chlorhexidine gluconate) soap before surgery. CHG is an antiseptic cleaner which kills germs and bonds with the skin to continue killing germs even after washing. Oral hygiene is also important in reducing the risk of infection. Remember to brush your teeth with your regular toothpaste the morning of surgery.  Please DO NOT use if you have an allergy to CHG or antibacterial soaps. If your skin becomes reddened/irritated stop using the CHG and inform your Pre-op nurse day of surgery.  DO NOT shave (including legs and  genital area) for at least 48 hours prior to your CHG shower.   Please follow these instructions carefully:  Shower with CHG soap the night before surgery. If you choose to wash your hair, wash your hair first as usual with your normal shampoo. After you shampoo, rinse your hair and body thoroughly to remove the shampoo. Use CHG as you would any other liquid soap. You can apply CHG directly to the skin and wash gently with a clean washcloth or shower sponge. Apply the CHG soap to your body ONLY FROM THE NECK DOWN. Do not use on open wounds or open sores. Avoid contact with your eyes, ears, mouth, and genitals (private parts). Wash genitals (private parts) with your normal soap. Wash thoroughly, paying special attention to the area where your surgery will be performed. Thoroughly rinse your body with warm water from the neck down. DO NOT shower/wash with your normal soap after using and rinsing off the CHG soap. DO NOT use lotions, oils, etc., after showering with CHG. Pat yourself dry with a clean towel. Wear clean pajamas. Place clean sheets on your bed the night of your CHG shower and do not sleep with pets.  Day of Surgery  DO NOT Apply any lotions,  powder,  oils,  deodorants (may use underarm deodorant),  cologne/  perfumes  or makeup Do Not wear jewelry /  piercing's/  metal/  permanent jewelry must be removed prior to arrival day of surgery. (No plastic piercing) Do Not wear nail polish,  gel polish,  artificial nails, or any other type of covering on natural finger nails (toe nails are okay) Remember to brush your teeth and rinse mouth out. Put on clean / comfortable clothes. [place] is not responsible for valuables/ personal belongings

## 2024-11-11 NOTE — Progress Notes (Signed)
 Spoke w/ via phone for pre-op interview--- Pam Mckee needs dos---- CBC, T&S, BMP and EKG lab appt 12/10 at 1130. UPT day of surgery.        Lab results------ COVID test -----patient states asymptomatic no test needed Arrive at -------0945 NPO after MN NO Solid Food.  Clear liquids from MN until---0845 Pre-Surgery Ensure or G2:  Med rec completed Medications to take morning of surgery -----Levothyroxine  Diabetic medication -----  GLP1 agonist last dose: Mounjaro  last injection 11/02/24. GLP1 instructions:No further doses until after surgery.  Patient instructed no nail polish to be worn day of surgery Patient instructed to bring photo id and insurance card day of surgery Patient aware to have Driver (ride ) / caregiver    for 24 hours after surgery - Husband Pam Mckee Patient Special Instructions -----CHG shower night before surgery.Bring CPAP mask and tubing day of surgery. Pre-Op special Instructions -----  Patient verbalized understanding of instructions that were given at this phone interview. Patient denies chest pain, sob, fever, cough at the interview.

## 2024-11-15 ENCOUNTER — Encounter: Payer: Self-pay | Admitting: Obstetrics and Gynecology

## 2024-11-15 ENCOUNTER — Other Ambulatory Visit (HOSPITAL_COMMUNITY)
Admission: RE | Admit: 2024-11-15 | Discharge: 2024-11-15 | Disposition: A | Source: Ambulatory Visit | Attending: Obstetrics and Gynecology | Admitting: Obstetrics and Gynecology

## 2024-11-15 ENCOUNTER — Other Ambulatory Visit: Payer: Self-pay

## 2024-11-15 ENCOUNTER — Ambulatory Visit: Admitting: Obstetrics and Gynecology

## 2024-11-15 VITALS — BP 114/78 | HR 80 | Ht 66.0 in | Wt 244.0 lb

## 2024-11-15 DIAGNOSIS — Z01810 Encounter for preprocedural cardiovascular examination: Secondary | ICD-10-CM | POA: Diagnosis not present

## 2024-11-15 DIAGNOSIS — Z01419 Encounter for gynecological examination (general) (routine) without abnormal findings: Secondary | ICD-10-CM

## 2024-11-15 DIAGNOSIS — N898 Other specified noninflammatory disorders of vagina: Secondary | ICD-10-CM

## 2024-11-15 DIAGNOSIS — Z01818 Encounter for other preprocedural examination: Secondary | ICD-10-CM

## 2024-11-15 MED ORDER — IBUPROFEN 800 MG PO TABS
800.0000 mg | ORAL_TABLET | Freq: Three times a day (TID) | ORAL | 1 refills | Status: DC | PRN
  Filled 2024-11-15: qty 30, 10d supply, fill #0

## 2024-11-15 MED ORDER — OXYCODONE HCL 5 MG PO TABS
5.0000 mg | ORAL_TABLET | ORAL | 0 refills | Status: DC | PRN
  Filled 2024-11-15: qty 5, 1d supply, fill #0

## 2024-11-15 MED ORDER — METOCLOPRAMIDE HCL 10 MG PO TABS
10.0000 mg | ORAL_TABLET | Freq: Three times a day (TID) | ORAL | 0 refills | Status: DC | PRN
  Filled 2024-11-15: qty 5, 2d supply, fill #0

## 2024-11-15 NOTE — H&P (View-Only) (Signed)
 53 y.o. y.o. female here for annual exam and preop for RLH, bilateral salpingectomy, cystoscopy, removal of IUD  No LMP recorded. (Menstrual status: IUD).   Exam Status  Status  Final [99]   PACS Intelerad Image Link   Show images for US  PELVIC COMPLETE WITH TRANSVAGINAL Study Result  Narrative & Impression  CLINICAL DATA:  Fibroids, IUD.   EXAM: TRANSABDOMINAL AND TRANSVAGINAL ULTRASOUND OF PELVIS   TECHNIQUE: Both transabdominal and transvaginal ultrasound examinations of the pelvis were performed. Transabdominal technique was performed for global imaging of the pelvis including uterus, ovaries, adnexal regions, and pelvic cul-de-sac. It was necessary to proceed with endovaginal exam following the transabdominal exam to visualize the uterus, endometrium, ovaries and adnexal regions.   COMPARISON:  06/17/2016.   FINDINGS: Uterus   Measurements: 8.6 x 6.1 x 9.2 cm = volume: 253 mL. Intrauterine contraceptive device is in appropriate position. Multiple hypoechoic masses in the uterus, measuring up to 4.5 x 3.3 x 3.4 cm.   Endometrium   Thickness: 5 mm, within normal limits for a perimenopausal female in the absence of abnormal uterine bleeding. No focal abnormality visualized.   Right ovary   Not visualized.   Left ovary   Measurements: 4.7 x 3.8 x 4.0 cm = volume: 38 mL. Image quality is limited by discomfort due to vaginal probe and body habitus. There is a 3.4 x 3.0 x 3.0 cm hypoechoic mass or anechoic mass with homogeneous low level internal echoes. Associated increased through transmission.   Other findings   Trace pelvic free fluid.   IMPRESSION: 1. Difficult exam due to retroverted uterus, uterine fibroids, body habitus and discomfort. 2. Left ovarian hypoechoic mass versus anechoic mass with homogeneous low level internal echoes. Associated increased through transmission. Initial follow-up pelvic ultrasound in 6-12 weeks is    She has  lost 30lbs with wellness program at [facility] scheduled on 11/18/24.   Health Maintenance: Pap:   08/04/2018 WNL NEG HPV,05/18/2015 WNL  History of abnormal Pap:  no MMG:  10/13/24 BMD:   n/a Colonoscopy: 04/05/21 polyps follow up 7 years  TDaP:  2013 Gardasil: n/a Mirena  IUD 06/24/16 EMB 04/12/24  There is no height or weight on file to calculate BMI.     02/08/2024    8:17 AM 06/03/2023   10:02 AM 07/20/2020    1:28 PM  Depression screen PHQ 2/9  Decreased Interest 0 1 0  Down, Depressed, Hopeless 0 0 0  PHQ - 2 Score 0 1 0  Altered sleeping 0 1   Tired, decreased energy 0 0   Change in appetite 0 0   Feeling bad or failure about yourself  0 0   Trouble concentrating 0 0   Moving slowly or fidgety/restless 0 0   Suicidal thoughts 0 0   PHQ-9 Score 0  2    Difficult doing work/chores Not difficult at all Not difficult at all      Data saved with a previous flowsheet row definition    There were no vitals taken for this visit.     Component Value Date/Time   DIAGPAP  09/21/2023 1729    - Negative for intraepithelial lesion or malignancy (NILM)   DIAGPAP  08/04/2018 0000    NEGATIVE FOR INTRAEPITHELIAL LESIONS OR MALIGNANCY.   HPVHIGH Negative 09/21/2023 1729   ADEQPAP  09/21/2023 1729    Satisfactory for evaluation; transformation zone component PRESENT.   ADEQPAP  08/04/2018 0000    Satisfactory for evaluation  endocervical/transformation  zone component PRESENT.    GYN HISTORY:    Component Value Date/Time   DIAGPAP  09/21/2023 1729    - Negative for intraepithelial lesion or malignancy (NILM)   DIAGPAP  08/04/2018 0000    NEGATIVE FOR INTRAEPITHELIAL LESIONS OR MALIGNANCY.   HPVHIGH Negative 09/21/2023 1729   ADEQPAP  09/21/2023 1729    Satisfactory for evaluation; transformation zone component PRESENT.   ADEQPAP  08/04/2018 0000    Satisfactory for evaluation  endocervical/transformation zone component PRESENT.    OB History  Gravida Para Term Preterm AB  Living  2 1 1  0 1 1  SAB IAB Ectopic Multiple Live Births  0 1 0 0     # Outcome Date GA Lbr Len/2nd Weight Sex Type Anes PTL Lv  2 IAB           1 Term             Past Medical History:  Diagnosis Date   Anemia    Back pain    Elevated lipids    High cholesterol    Hypertension    Olecranon bursitis of right elbow 02/15/2015   Suspect this was an acute gout flare triggered by HCTZ since she had no trauma    OSA on CPAP    Pre-diabetes    Prediabetes    Primary osteoarthritis of left knee 05/18/2015   Nonweightbearing x-rays revealed mild degenerative changes.  Referred to [facility]  - saw Jaclyn , PA-C    Seizures [facility])    Thyroid  disease    hypo   Torn LEFT medial meniscus 04/2015   Left knee MRI confirmed but no instability sxs. Seen at [facility] 04/2016 to review treatment plan. sxs responded to cortisone injection summer 2016 and 04/2016 so continue prn. Consider arthroscopy if sxs become unresponsive.    Past Surgical History:  Procedure Laterality Date   COLONOSCOPY WITH PROPOFOL  N/A 04/05/2021   Procedure: COLONOSCOPY WITH PROPOFOL ;  Surgeon: Rollin Dover, MD;  Location: [facility];  Service: Endoscopy;  Laterality: N/A;   INTRAUTERINE DEVICE (IUD) INSERTION  06/2016   POLYPECTOMY  04/05/2021   Procedure: POLYPECTOMY;  Surgeon: Rollin Dover, MD;  Location: [facility];  Service: Endoscopy;;    Current Outpatient Medications on File Prior to Visit  Medication Sig Dispense Refill   chlorthalidone  (HYGROTON ) 25 MG tablet Take 0.5 tablets (12.5 mg total) by mouth daily. 60 tablet 3   levonorgestrel  (MIRENA ) 20 MCG/24HR IUD 1 each by Intrauterine route once.     levothyroxine  (SYNTHROID ) 112 MCG tablet Take 1 tablet (112 mcg total) by mouth daily on an empty stomach. No food or drink for 30 mins after taking medication. 90 tablet 2   losartan  (COZAAR ) 100 MG tablet Take 1 tablet (100 mg total) by mouth daily. 90 tablet 2   NON FORMULARY Pt uses a cpap      Prenatal MV & Min w/FA-DHA (PRENATAL GUMMIES PO) Take 1 Piece by mouth daily.     rosuvastatin  (CRESTOR ) 10 MG tablet Take 1 tablet (10 mg total) by mouth daily. 90 tablet 2   tirzepatide  (MOUNJARO ) 15 MG/0.5ML Pen Inject 15 mg into the skin once a week. 6 mL 0   triamcinolone  ointment (KENALOG ) 0.1 % Apply 1 Application topically 2 (two) times daily. (Patient not taking: Reported on 11/11/2024) 30 g 0   Vitamin D , Ergocalciferol , (DRISDOL ) 1.25 MG (50000 UNIT) CAPS capsule Take 1 capsule (50,000 Units total) by mouth every 14 (fourteen) days. (Patient not  taking: Reported on 11/11/2024) 6 capsule 0   No current facility-administered medications on file prior to visit.    Social History   Socioeconomic History   Marital status: Married    Spouse name: Not on file   Number of children: 1   Years of education: HS   Highest education level: 12th grade  Occupational History   Occupation: Arboriculturist: [employer]  Tobacco Use   Smoking status: Never   Smokeless tobacco: Never  Vaping Use   Vaping status: Never Used  Substance and Sexual Activity   Alcohol use: No    Alcohol/week: 0.0 standard drinks of alcohol   Drug use: No   Sexual activity: Yes    Partners: Male    Birth control/protection: I.U.D.  Other Topics Concern   Not on file  Social History Narrative      Drinks about 2-3 cups of coffee/tea a day. Education: [employer].   Social Drivers of Corporate Investment Banker Strain: Low Risk  (07/04/2024)   Overall Financial Resource Strain (CARDIA)    Difficulty of Paying Living Expenses: Not very hard  Food Insecurity: Patient Declined (07/04/2024)   Hunger Vital Sign    Worried About Running Out of Food in the Last Year: Patient declined    Ran Out of Food in the Last Year: Patient declined  Transportation Needs: No Transportation Needs (07/04/2024)   PRAPARE - Administrator, Civil Service (Medical): No    Lack of Transportation  (Non-Medical): No  Physical Activity: Insufficiently Active (07/04/2024)   Exercise Vital Sign    Days of Exercise per Week: 5 days    Minutes of Exercise per Session: 10 min  Stress: No Stress Concern Present (07/04/2024)   [employer] of Occupational Health - Occupational Stress Questionnaire    Feeling of Stress: Not at all  Social Connections: Moderately Integrated (07/04/2024)   Social Connection and Isolation Panel    Frequency of Communication with Friends and Family: Three times a week    Frequency of Social Gatherings with Friends and Family: More than three times a week    Attends Religious Services: 1 to 4 times per year    Active Member of [employer] or Organizations: No    Attends Engineer, Structural: Not on file    Marital Status: Married  Catering Manager Violence: Not At Risk (02/08/2024)   Humiliation, Afraid, Rape, and Kick questionnaire    Fear of Current or Ex-Partner: No    Emotionally Abused: No    Physically Abused: No    Sexually Abused: No    Family History  Problem Relation Age of Onset   Hypertension Mother    CVA Mother        occured while on lovenox   Stroke Mother    Renal Disease Father    Kidney disease Father    Hypertension Brother    Aneurysm Brother    Diabetes Brother    Diabetes Paternal Grandfather    Breast cancer Neg Hx      No Known Allergies    Patient's last menstrual period was No LMP recorded. (Menstrual status: IUD)..             Review of Systems Alls systems reviewed and are negative.     Physical Exam Constitutional:      Appearance: Normal appearance.  Genitourinary:     Vulva and urethral meatus normal.     No lesions in  the vagina.     Genitourinary Comments: Low IUD  US : for uterus evaluation recently     Right Labia: No rash, lesions or skin changes.    Left Labia: No lesions, skin changes or rash.    No vaginal discharge or tenderness.     No vaginal prolapse present.    No vaginal atrophy  present.     Right Adnexa: not tender, not palpable and no mass present.    Left Adnexa: not tender, not palpable and no mass present.    No cervical motion tenderness or discharge.     IUD strings visualized.  Breasts:    Right: Normal.     Left: Normal.  HENT:     Head: Normocephalic.  Neck:     Thyroid : No thyroid  mass, thyromegaly or thyroid  tenderness.  Cardiovascular:     Rate and Rhythm: Normal rate and regular rhythm.     Heart sounds: Normal heart sounds, S1 normal and S2 normal.  Pulmonary:     Effort: Pulmonary effort is normal.     Breath sounds: Normal breath sounds and air entry.  Abdominal:     General: There is no distension.     Palpations: Abdomen is soft. There is no mass.     Tenderness: There is no abdominal tenderness. There is no guarding or rebound.  Musculoskeletal:        General: Normal range of motion.     Cervical back: Full passive range of motion without pain, normal range of motion and neck supple. No tenderness.     Right lower leg: No edema.     Left lower leg: No edema.  Neurological:     Mental Status: She is alert.  Skin:    General: Skin is warm.  Psychiatric:        Mood and Affect: Mood normal.        Behavior: Behavior normal.        Thought Content: Thought content normal.  Vitals and nursing note reviewed. Exam conducted with a chaperone present.       A:         Well Woman GYN exam                             P:        Pap smear collected today Encouraged annual mammogram screening Colon cancer screening up-to-date DXA not indicated Labs and immunizations to do with PMD Encouraged healthy lifestyle practices Encouraged Vit D and Calcium    The risks of surgery were discussed in detail with the patient including but not limited to: bleeding which may require transfusion or reoperation; infection which may require prolonged hospitalization or re-hospitalization and antibiotic therapy; injury to bowel, bladder, ureters and  major vessels or other surrounding organs which may lead to other procedures; formation of adhesions; need for additional procedures including laparotomy or subsequent procedures secondary to intraoperative injury or abnormal pathology; thromboembolic phenomenon; incisional problems and other postoperative or anesthesia complications.  The postoperative expectations were also discussed in detail. The patient also understands the alternative treatment options which were discussed in full. All questions were answered.  Patient would like to proceed with the procedure.  32ounseling patient and documentation Dr. Glennon  Dr. Glennon   No follow-ups on file.  Pam Mckee

## 2024-11-15 NOTE — Progress Notes (Signed)
 53 y.o. y.o. female here for annual exam and preop for RLH, bilateral salpingectomy, cystoscopy, removal of IUD  No LMP recorded. (Menstrual status: IUD).   Exam Status  Status  Final [99]   PACS Intelerad Image Link   Show images for US  PELVIC COMPLETE WITH TRANSVAGINAL Study Result  Narrative & Impression  CLINICAL DATA:  Fibroids, IUD.   EXAM: TRANSABDOMINAL AND TRANSVAGINAL ULTRASOUND OF PELVIS   TECHNIQUE: Both transabdominal and transvaginal ultrasound examinations of the pelvis were performed. Transabdominal technique was performed for global imaging of the pelvis including uterus, ovaries, adnexal regions, and pelvic cul-de-sac. It was necessary to proceed with endovaginal exam following the transabdominal exam to visualize the uterus, endometrium, ovaries and adnexal regions.   COMPARISON:  06/17/2016.   FINDINGS: Uterus   Measurements: 8.6 x 6.1 x 9.2 cm = volume: 253 mL. Intrauterine contraceptive device is in appropriate position. Multiple hypoechoic masses in the uterus, measuring up to 4.5 x 3.3 x 3.4 cm.   Endometrium   Thickness: 5 mm, within normal limits for a perimenopausal female in the absence of abnormal uterine bleeding. No focal abnormality visualized.   Right ovary   Not visualized.   Left ovary   Measurements: 4.7 x 3.8 x 4.0 cm = volume: 38 mL. Image quality is limited by discomfort due to vaginal probe and body habitus. There is a 3.4 x 3.0 x 3.0 cm hypoechoic mass or anechoic mass with homogeneous low level internal echoes. Associated increased through transmission.   Other findings   Trace pelvic free fluid.   IMPRESSION: 1. Difficult exam due to retroverted uterus, uterine fibroids, body habitus and discomfort. 2. Left ovarian hypoechoic mass versus anechoic mass with homogeneous low level internal echoes. Associated increased through transmission. Initial follow-up pelvic ultrasound in 6-12 weeks is    She has  lost 30lbs with wellness program at [facility] scheduled on 11/18/24.   Health Maintenance: Pap:   08/04/2018 WNL NEG HPV,05/18/2015 WNL  History of abnormal Pap:  no MMG:  10/13/24 BMD:   n/a Colonoscopy: 04/05/21 polyps follow up 7 years  TDaP:  2013 Gardasil: n/a Mirena  IUD 06/24/16 EMB 04/12/24  There is no height or weight on file to calculate BMI.     02/08/2024    8:17 AM 06/03/2023   10:02 AM 07/20/2020    1:28 PM  Depression screen PHQ 2/9  Decreased Interest 0 1 0  Down, Depressed, Hopeless 0 0 0  PHQ - 2 Score 0 1 0  Altered sleeping 0 1   Tired, decreased energy 0 0   Change in appetite 0 0   Feeling bad or failure about yourself  0 0   Trouble concentrating 0 0   Moving slowly or fidgety/restless 0 0   Suicidal thoughts 0 0   PHQ-9 Score 0  2    Difficult doing work/chores Not difficult at all Not difficult at all      Data saved with a previous flowsheet row definition    There were no vitals taken for this visit.     Component Value Date/Time   DIAGPAP  09/21/2023 1729    - Negative for intraepithelial lesion or malignancy (NILM)   DIAGPAP  08/04/2018 0000    NEGATIVE FOR INTRAEPITHELIAL LESIONS OR MALIGNANCY.   HPVHIGH Negative 09/21/2023 1729   ADEQPAP  09/21/2023 1729    Satisfactory for evaluation; transformation zone component PRESENT.   ADEQPAP  08/04/2018 0000    Satisfactory for evaluation  endocervical/transformation  zone component PRESENT.    GYN HISTORY:    Component Value Date/Time   DIAGPAP  09/21/2023 1729    - Negative for intraepithelial lesion or malignancy (NILM)   DIAGPAP  08/04/2018 0000    NEGATIVE FOR INTRAEPITHELIAL LESIONS OR MALIGNANCY.   HPVHIGH Negative 09/21/2023 1729   ADEQPAP  09/21/2023 1729    Satisfactory for evaluation; transformation zone component PRESENT.   ADEQPAP  08/04/2018 0000    Satisfactory for evaluation  endocervical/transformation zone component PRESENT.    OB History  Gravida Para Term Preterm AB  Living  2 1 1  0 1 1  SAB IAB Ectopic Multiple Live Births  0 1 0 0     # Outcome Date GA Lbr Len/2nd Weight Sex Type Anes PTL Lv  2 IAB           1 Term             Past Medical History:  Diagnosis Date   Anemia    Back pain    Elevated lipids    High cholesterol    Hypertension    Olecranon bursitis of right elbow 02/15/2015   Suspect this was an acute gout flare triggered by HCTZ since she had no trauma    OSA on CPAP    Pre-diabetes    Prediabetes    Primary osteoarthritis of left knee 05/18/2015   Nonweightbearing x-rays revealed mild degenerative changes.  Referred to [facility]  - saw Jaclyn , PA-C    Seizures [facility])    Thyroid  disease    hypo   Torn LEFT medial meniscus 04/2015   Left knee MRI confirmed but no instability sxs. Seen at [facility] 04/2016 to review treatment plan. sxs responded to cortisone injection summer 2016 and 04/2016 so continue prn. Consider arthroscopy if sxs become unresponsive.    Past Surgical History:  Procedure Laterality Date   COLONOSCOPY WITH PROPOFOL  N/A 04/05/2021   Procedure: COLONOSCOPY WITH PROPOFOL ;  Surgeon: Rollin Dover, MD;  Location: [facility];  Service: Endoscopy;  Laterality: N/A;   INTRAUTERINE DEVICE (IUD) INSERTION  06/2016   POLYPECTOMY  04/05/2021   Procedure: POLYPECTOMY;  Surgeon: Rollin Dover, MD;  Location: [facility];  Service: Endoscopy;;    Current Outpatient Medications on File Prior to Visit  Medication Sig Dispense Refill   chlorthalidone  (HYGROTON ) 25 MG tablet Take 0.5 tablets (12.5 mg total) by mouth daily. 60 tablet 3   levonorgestrel  (MIRENA ) 20 MCG/24HR IUD 1 each by Intrauterine route once.     levothyroxine  (SYNTHROID ) 112 MCG tablet Take 1 tablet (112 mcg total) by mouth daily on an empty stomach. No food or drink for 30 mins after taking medication. 90 tablet 2   losartan  (COZAAR ) 100 MG tablet Take 1 tablet (100 mg total) by mouth daily. 90 tablet 2   NON FORMULARY Pt uses a cpap      Prenatal MV & Min w/FA-DHA (PRENATAL GUMMIES PO) Take 1 Piece by mouth daily.     rosuvastatin  (CRESTOR ) 10 MG tablet Take 1 tablet (10 mg total) by mouth daily. 90 tablet 2   tirzepatide  (MOUNJARO ) 15 MG/0.5ML Pen Inject 15 mg into the skin once a week. 6 mL 0   triamcinolone  ointment (KENALOG ) 0.1 % Apply 1 Application topically 2 (two) times daily. (Patient not taking: Reported on 11/11/2024) 30 g 0   Vitamin D , Ergocalciferol , (DRISDOL ) 1.25 MG (50000 UNIT) CAPS capsule Take 1 capsule (50,000 Units total) by mouth every 14 (fourteen) days. (Patient not  taking: Reported on 11/11/2024) 6 capsule 0   No current facility-administered medications on file prior to visit.    Social History   Socioeconomic History   Marital status: Married    Spouse name: Not on file   Number of children: 1   Years of education: HS   Highest education level: 12th grade  Occupational History   Occupation: Arboriculturist: [employer]  Tobacco Use   Smoking status: Never   Smokeless tobacco: Never  Vaping Use   Vaping status: Never Used  Substance and Sexual Activity   Alcohol use: No    Alcohol/week: 0.0 standard drinks of alcohol   Drug use: No   Sexual activity: Yes    Partners: Male    Birth control/protection: I.U.D.  Other Topics Concern   Not on file  Social History Narrative      Drinks about 2-3 cups of coffee/tea a day. Education: [employer].   Social Drivers of Corporate Investment Banker Strain: Low Risk  (07/04/2024)   Overall Financial Resource Strain (CARDIA)    Difficulty of Paying Living Expenses: Not very hard  Food Insecurity: Patient Declined (07/04/2024)   Hunger Vital Sign    Worried About Running Out of Food in the Last Year: Patient declined    Ran Out of Food in the Last Year: Patient declined  Transportation Needs: No Transportation Needs (07/04/2024)   PRAPARE - Administrator, Civil Service (Medical): No    Lack of Transportation  (Non-Medical): No  Physical Activity: Insufficiently Active (07/04/2024)   Exercise Vital Sign    Days of Exercise per Week: 5 days    Minutes of Exercise per Session: 10 min  Stress: No Stress Concern Present (07/04/2024)   [employer] of Occupational Health - Occupational Stress Questionnaire    Feeling of Stress: Not at all  Social Connections: Moderately Integrated (07/04/2024)   Social Connection and Isolation Panel    Frequency of Communication with Friends and Family: Three times a week    Frequency of Social Gatherings with Friends and Family: More than three times a week    Attends Religious Services: 1 to 4 times per year    Active Member of [employer] or Organizations: No    Attends Engineer, Structural: Not on file    Marital Status: Married  Catering Manager Violence: Not At Risk (02/08/2024)   Humiliation, Afraid, Rape, and Kick questionnaire    Fear of Current or Ex-Partner: No    Emotionally Abused: No    Physically Abused: No    Sexually Abused: No    Family History  Problem Relation Age of Onset   Hypertension Mother    CVA Mother        occured while on lovenox   Stroke Mother    Renal Disease Father    Kidney disease Father    Hypertension Brother    Aneurysm Brother    Diabetes Brother    Diabetes Paternal Grandfather    Breast cancer Neg Hx      No Known Allergies    Patient's last menstrual period was No LMP recorded. (Menstrual status: IUD)..             Review of Systems Alls systems reviewed and are negative.     Physical Exam Constitutional:      Appearance: Normal appearance.  Genitourinary:     Vulva and urethral meatus normal.     No lesions in  the vagina.     Genitourinary Comments: Low IUD  US : for uterus evaluation recently     Right Labia: No rash, lesions or skin changes.    Left Labia: No lesions, skin changes or rash.    No vaginal discharge or tenderness.     No vaginal prolapse present.    No vaginal atrophy  present.     Right Adnexa: not tender, not palpable and no mass present.    Left Adnexa: not tender, not palpable and no mass present.    No cervical motion tenderness or discharge.     IUD strings visualized.  Breasts:    Right: Normal.     Left: Normal.  HENT:     Head: Normocephalic.  Neck:     Thyroid : No thyroid  mass, thyromegaly or thyroid  tenderness.  Cardiovascular:     Rate and Rhythm: Normal rate and regular rhythm.     Heart sounds: Normal heart sounds, S1 normal and S2 normal.  Pulmonary:     Effort: Pulmonary effort is normal.     Breath sounds: Normal breath sounds and air entry.  Abdominal:     General: There is no distension.     Palpations: Abdomen is soft. There is no mass.     Tenderness: There is no abdominal tenderness. There is no guarding or rebound.  Musculoskeletal:        General: Normal range of motion.     Cervical back: Full passive range of motion without pain, normal range of motion and neck supple. No tenderness.     Right lower leg: No edema.     Left lower leg: No edema.  Neurological:     Mental Status: She is alert.  Skin:    General: Skin is warm.  Psychiatric:        Mood and Affect: Mood normal.        Behavior: Behavior normal.        Thought Content: Thought content normal.  Vitals and nursing note reviewed. Exam conducted with a chaperone present.       A:         Well Woman GYN exam                             P:        Pap smear collected today Encouraged annual mammogram screening Colon cancer screening up-to-date DXA not indicated Labs and immunizations to do with PMD Encouraged healthy lifestyle practices Encouraged Vit D and Calcium    The risks of surgery were discussed in detail with the patient including but not limited to: bleeding which may require transfusion or reoperation; infection which may require prolonged hospitalization or re-hospitalization and antibiotic therapy; injury to bowel, bladder, ureters and  major vessels or other surrounding organs which may lead to other procedures; formation of adhesions; need for additional procedures including laparotomy or subsequent procedures secondary to intraoperative injury or abnormal pathology; thromboembolic phenomenon; incisional problems and other postoperative or anesthesia complications.  The postoperative expectations were also discussed in detail. The patient also understands the alternative treatment options which were discussed in full. All questions were answered.  Patient would like to proceed with the procedure.  32ounseling patient and documentation Dr. Glennon  Dr. Glennon   No follow-ups on file.  Pam Mckee

## 2024-11-15 NOTE — Patient Instructions (Signed)
 Robotic Laparoscopic Hysterectomy, Care After  The following information offers guidance on how to care for yourself after your procedure. Your health care provider may also give you more specific instructions. If you have problems or questions, contact your health care provider. What can I expect after the procedure? After the procedure, it is common to have: Pain, bruising, and numbness around your incisions. Tiredness (fatigue).  Abdominal bloating Poor appetite. Chest discomfort that radiates to your shoulder from the carbon dioxide gas for a few days after  Vaginal discharge or spotting. You will need to use a sanitary pad after this procedure.  HEAVY BLEEDING LIKE A PERIOD IS NOT NORMAL.  PLEASE CALL YOUR PROVIDER IF SOAKING A PAD or have copious discharge and or pain.  Feelings of sadness or other emotions.  If your ovaries were also removed, it is also common to have symptoms of menopause, such as hot flashes, night sweats, and lack of sleep (insomnia).  Ovaries should stay in if at all possible until at least the age of 62. Follow these instructions at home: Medicines Take over-the-counter and prescription medicines only as told by your health care provider. Ask your health care provider if the medicine prescribed to you: Requires you to avoid driving or using machinery. You cannot drive for 24 hours after anesthesia Can cause constipation. You may need to take these actions to prevent or treat constipation: Drink enough fluid to keep your urine pale yellow. Take over-the-counter or prescription medicines. Eat foods that are high in fiber, such as beans, whole grains, and fresh fruits and vegetables. Limit foods that are high in fat and processed sugars, such as fried or sweet foods.  Also, avoid spicy foods.  NAUSEA IS COMMON THE FIRST NIGHT OF SURGERY.  IF IT LASTS BEYOND 24 HOURS, CALL YOUR PROVIDER.  NAUSEA MEDICATION WAS GIVEN AT YOUR PREOP APPOINTMENT THAT YOU CAN TAKE  AFTER SURGERY. Incision care  Follow instructions from your health care provider about how to take care of your incisions. Make sure you: LEAVE INCISION OPEN AND DRY-NO BANDAGES Leave stitches (sutures), skin glue, or adhesive strips in place UNTIL 2 WEEKS THEN REMOVE IN THE SHOWER.  If adhesive strip edges start to loosen and curl up, you may trim the loose edges. Check your incision areas every day for signs of infection. Check for: More redness, swelling, or pain. Fluid or blood. Warmth. Pus or a bad smell. Activity  Rest as told by your health care provider. Avoid sitting for a long time without moving. Get up to take short walks every 1-2 hours. This is important to improve blood flow and breathing. Ask for help if you feel weak or unsteady.  If you are sore or tired, rest. Return to your normal activities as told by your health care provider. Ask your health care provider what activities are safe for you. Do not lift, push or pull anything that is heavier than 13 lb (4.5 kg), or the limit that you are told, for 6 WEEKS after surgery or until your health care provider says that it is safe. If you were given a sedative during the procedure, it can affect you for several hours. Do not drive or operate machinery until your health care provider says that it is safe. Lifestyle Do not use any products that contain nicotine or tobacco. These products include cigarettes, chewing tobacco, and vaping devices, such as e-cigarettes. These can delay healing after surgery. If you need help quitting, ask your health care provider.  Do not drink alcohol until your health care provider approves. Take a daily multivitamin and keep a high protein diet for wound healing  DO NOT HAVE INTERCOURSE UNTIL YOU ARE INSTRUCTED THAT IT IS SAFE TO DO SO  Post operative appointments need to be scheduled at 2, 6 and 10 weeks.  You can come anytime before these with any concerns.   Discussed and reviewed with patient  risks with early intercourse or use of any foreign objects (externally or internally) can increase your risk including but not limited to the risk of vaginal cuff separation and or infection, risks for bowel involvement, risk for emergent surgery, and hospital admission with need for antibiotics.  Discussed in cases with cuff separation and bowel involvement there may be the need for colostomy placement as well.  In no situation should she have intercourse unless cleared to do so.  This can be anywhere from 10 weeks or longer after surgery.  General instructions YOU MAY TAKE SHOWERS ONLY FOR 2 WEEKS AFTER SURGERY, THEN YOU MAY USE TUBS AND HOT TUBS OR SWIM AFTER THAT Do not douche, use tampons, or have sex for at least 10 weeks, or possibly longer. You will need to have an exam done in the office to be cleared to have intercourse. If you struggle with physical or emotional changes after your procedure, speak with your health care provider or a therapist.  IF YOU HAVE BURNING WITH URINATION, PLEASE CALL YOUR DOCTOR. BLADDER INFECTIONS MAY OCCUR AFTER SURGERY Try to have someone at home with you for the first week to help with your daily chores.  Most patients are driving by the end of the first week after the robotic hysterectomy. Wear compression stockings as told by your health care provider. These stockings help to prevent blood clots and reduce swelling in your legs. Keep all follow-up visits. This is important. Contact a health care provider if: You have any of these signs of infection: Chills or a fever 148f OR GREATER. More redness, swelling, or pain around an incision. Fluid or blood coming from an incision. Warmth coming from an incision. Pus or a bad smell coming from an incision. Burning with urination. Urinary frequency or cramping.   IF YOU HAVE THESE SYMPTOMS, PLEASE CALL THE OFFICE TO COME EVALUATE FOR A BLADDER INFECTION AT [phone] An incision opens. You feel dizzy or  light-headed. You have pain or bleeding when you urinate, or you are unable to urinate. You have abnormal vaginal discharge. You have pain that does not get better with medicine. Get help right away if: You have a fever and your symptoms suddenly get worse. You have severe abdominal pain. Heavy vaginal bleeding, like a period You have chest pain or shortness of breath. You may have chest pain and shortness of breath from the CO2 gas for a few days after surgery.  This is very common.  Walking, Gas-X and motrin  will usually help relieve this discomfort You faint. You have pain, swelling, or redness in your leg.  These symptoms may represent a serious problem that is an emergency. Do not wait to see if the symptoms will go away. Get medical help right away. Call your local emergency services (911 in the [place] Do not drive yourself to the hospital. Summary  CONSTIPATION MEDICATION AFTER SURGERY: COLACE, MOM, MIRALAX , GAS X are all helpful to have on hand, if needed.  FILL ALL POSTOP MEDICATION BEFORE SURGERY   [email or web address] is a nice blog site for women preparing for the  robotic hysterectomy

## 2024-11-16 ENCOUNTER — Ambulatory Visit: Payer: Self-pay | Admitting: Obstetrics and Gynecology

## 2024-11-16 ENCOUNTER — Encounter: Payer: Self-pay | Admitting: Obstetrics and Gynecology

## 2024-11-16 ENCOUNTER — Inpatient Hospital Stay (HOSPITAL_COMMUNITY): Admission: RE | Admit: 2024-11-16 | Discharge: 2024-11-16 | Attending: Obstetrics and Gynecology

## 2024-11-16 ENCOUNTER — Other Ambulatory Visit: Payer: Self-pay

## 2024-11-16 DIAGNOSIS — Z01818 Encounter for other preprocedural examination: Secondary | ICD-10-CM | POA: Diagnosis not present

## 2024-11-16 DIAGNOSIS — I10 Essential (primary) hypertension: Secondary | ICD-10-CM | POA: Diagnosis not present

## 2024-11-16 LAB — BASIC METABOLIC PANEL WITH GFR
Anion gap: 7 (ref 5–15)
BUN: 15 mg/dL (ref 6–20)
CO2: 28 mmol/L (ref 22–32)
Calcium: 8.8 mg/dL — ABNORMAL LOW (ref 8.9–10.3)
Chloride: 102 mmol/L (ref 98–111)
Creatinine, Ser: 0.88 mg/dL (ref 0.44–1.00)
GFR, Estimated: >60 mL/min (ref >60)
Glucose, Bld: 91 mg/dL (ref 70–99)
Potassium: 2.9 mmol/L — ABNORMAL LOW (ref 3.5–5.1)
Sodium: 137 mmol/L (ref 135–145)

## 2024-11-16 LAB — TYPE AND SCREEN
ABO/RH(D): A POS
Antibody Screen: NEGATIVE

## 2024-11-16 LAB — CBC
HCT: 37.4 % (ref 36.0–46.0)
Hemoglobin: 12.2 g/dL (ref 12.0–15.0)
MCH: 26 pg (ref 26.0–34.0)
MCHC: 32.6 g/dL (ref 30.0–36.0)
MCV: 79.7 fL — ABNORMAL LOW (ref 80.0–100.0)
Platelets: 291 K/uL (ref 150–400)
RBC: 4.69 MIL/uL (ref 3.87–5.11)
RDW: 15.1 % (ref 11.5–15.5)
WBC: 7.3 K/uL (ref 4.0–10.5)
nRBC: 0 % (ref 0.0–0.2)

## 2024-11-17 ENCOUNTER — Ambulatory Visit: Payer: Self-pay | Admitting: Obstetrics and Gynecology

## 2024-11-17 LAB — SURESWAB® ADVANCED VAGINITIS PLUS,TMA
C. trachomatis RNA, TMA: NOT DETECTED
CANDIDA SPECIES: NOT DETECTED
Candida glabrata: NOT DETECTED
N. gonorrhoeae RNA, TMA: NOT DETECTED
SURESWAB(R) ADV BACTERIAL VAGINOSIS(BV),TMA: NEGATIVE
TRICHOMONAS VAGINALIS (TV),TMA: NOT DETECTED

## 2024-11-17 MED ORDER — SODIUM CHLORIDE 0.9 % IV SOLN
Freq: Once | INTRAVENOUS | Status: DC
  Filled 2024-11-17: qty 10

## 2024-11-18 ENCOUNTER — Ambulatory Visit (HOSPITAL_COMMUNITY)
Admission: RE | Admit: 2024-11-18 | Discharge: 2024-11-18 | Disposition: A | Attending: Obstetrics and Gynecology | Admitting: Obstetrics and Gynecology

## 2024-11-18 ENCOUNTER — Ambulatory Visit (HOSPITAL_COMMUNITY): Admitting: Anesthesiology

## 2024-11-18 ENCOUNTER — Other Ambulatory Visit: Payer: Self-pay

## 2024-11-18 ENCOUNTER — Encounter: Admission: RE | Disposition: A | Payer: Self-pay | Attending: Obstetrics and Gynecology

## 2024-11-18 DIAGNOSIS — D219 Benign neoplasm of connective and other soft tissue, unspecified: Secondary | ICD-10-CM | POA: Diagnosis not present

## 2024-11-18 DIAGNOSIS — N888 Other specified noninflammatory disorders of cervix uteri: Secondary | ICD-10-CM | POA: Diagnosis not present

## 2024-11-18 DIAGNOSIS — Z09 Encounter for follow-up examination after completed treatment for conditions other than malignant neoplasm: Secondary | ICD-10-CM

## 2024-11-18 DIAGNOSIS — I10 Essential (primary) hypertension: Secondary | ICD-10-CM

## 2024-11-18 DIAGNOSIS — D259 Leiomyoma of uterus, unspecified: Secondary | ICD-10-CM | POA: Diagnosis not present

## 2024-11-18 DIAGNOSIS — N950 Postmenopausal bleeding: Secondary | ICD-10-CM | POA: Diagnosis not present

## 2024-11-18 DIAGNOSIS — G4730 Sleep apnea, unspecified: Secondary | ICD-10-CM | POA: Diagnosis not present

## 2024-11-18 DIAGNOSIS — N920 Excessive and frequent menstruation with regular cycle: Secondary | ICD-10-CM | POA: Diagnosis not present

## 2024-11-18 DIAGNOSIS — R569 Unspecified convulsions: Secondary | ICD-10-CM | POA: Diagnosis not present

## 2024-11-18 DIAGNOSIS — N838 Other noninflammatory disorders of ovary, fallopian tube and broad ligament: Secondary | ICD-10-CM | POA: Diagnosis not present

## 2024-11-18 DIAGNOSIS — Z7985 Long-term (current) use of injectable non-insulin antidiabetic drugs: Secondary | ICD-10-CM | POA: Diagnosis not present

## 2024-11-18 DIAGNOSIS — D649 Anemia, unspecified: Secondary | ICD-10-CM | POA: Diagnosis not present

## 2024-11-18 DIAGNOSIS — N921 Excessive and frequent menstruation with irregular cycle: Secondary | ICD-10-CM | POA: Diagnosis present

## 2024-11-18 DIAGNOSIS — N946 Dysmenorrhea, unspecified: Secondary | ICD-10-CM | POA: Diagnosis not present

## 2024-11-18 DIAGNOSIS — Z01818 Encounter for other preprocedural examination: Secondary | ICD-10-CM

## 2024-11-18 DIAGNOSIS — E119 Type 2 diabetes mellitus without complications: Secondary | ICD-10-CM | POA: Diagnosis not present

## 2024-11-18 HISTORY — PX: CYSTOSCOPY: SHX5120

## 2024-11-18 HISTORY — DX: Prediabetes: R73.03

## 2024-11-18 HISTORY — PX: IUD REMOVAL: SHX5392

## 2024-11-18 HISTORY — PX: HYSTERECTOMY, TOTAL, LAPAROSCOPIC, ROBOT-ASSISTED WITH SALPINGECTOMY: SHX7587

## 2024-11-18 LAB — BASIC METABOLIC PANEL WITH GFR
Anion gap: 9 (ref 5–15)
BUN: 18 mg/dL (ref 6–20)
CO2: 26 mmol/L (ref 22–32)
Calcium: 8.3 mg/dL — ABNORMAL LOW (ref 8.9–10.3)
Chloride: 101 mmol/L (ref 98–111)
Creatinine, Ser: 0.79 mg/dL (ref 0.44–1.00)
GFR, Estimated: >60 mL/min (ref >60)
Glucose, Bld: 90 mg/dL (ref 70–99)
Potassium: 3.3 mmol/L — ABNORMAL LOW (ref 3.5–5.1)
Sodium: 136 mmol/L (ref 135–145)

## 2024-11-18 LAB — CYTOLOGY - PAP: Diagnosis: REACTIVE

## 2024-11-18 LAB — ABO/RH: ABO/RH(D): A POS

## 2024-11-18 LAB — POCT PREGNANCY, URINE: Preg Test, Ur: NEGATIVE

## 2024-11-18 SURGERY — HYSTERECTOMY, TOTAL, LAPAROSCOPIC, ROBOT-ASSISTED WITH SALPINGECTOMY
Anesthesia: General | Site: Pelvis

## 2024-11-18 MED ORDER — ACETAMINOPHEN 500 MG PO TABS
ORAL_TABLET | ORAL | Status: AC
  Filled 2024-11-18: qty 2

## 2024-11-18 MED ORDER — FENTANYL CITRATE (PF) 100 MCG/2ML IJ SOLN
25.0000 ug | INTRAMUSCULAR | Status: DC | PRN
  Administered 2024-11-18: 25 ug via INTRAVENOUS
  Administered 2024-11-18 (×2): 50 ug via INTRAVENOUS

## 2024-11-18 MED ORDER — SUGAMMADEX SODIUM 200 MG/2ML IV SOLN
INTRAVENOUS | Status: DC | PRN
  Administered 2024-11-18: 300 mg via INTRAVENOUS

## 2024-11-18 MED ORDER — LIDOCAINE 2% (20 MG/ML) 5 ML SYRINGE
INTRAMUSCULAR | Status: DC | PRN
  Administered 2024-11-18: 80 mg via INTRAVENOUS

## 2024-11-18 MED ORDER — FENTANYL CITRATE (PF) 100 MCG/2ML IJ SOLN
INTRAMUSCULAR | Status: AC
  Filled 2024-11-18: qty 2

## 2024-11-18 MED ORDER — ONDANSETRON HCL 4 MG/2ML IJ SOLN
INTRAMUSCULAR | Status: DC | PRN
  Administered 2024-11-18: 4 mg via INTRAVENOUS

## 2024-11-18 MED ORDER — METRONIDAZOLE 500 MG/100ML IV SOLN
INTRAVENOUS | Status: AC
  Filled 2024-11-18: qty 100

## 2024-11-18 MED ORDER — FENTANYL CITRATE (PF) 250 MCG/5ML IJ SOLN
INTRAMUSCULAR | Status: DC | PRN
  Administered 2024-11-18 (×2): 100 ug via INTRAVENOUS
  Administered 2024-11-18: 50 ug via INTRAVENOUS

## 2024-11-18 MED ORDER — MIDAZOLAM HCL (PF) 2 MG/2ML IJ SOLN
INTRAMUSCULAR | Status: DC | PRN
  Administered 2024-11-18: 2 mg via INTRAVENOUS

## 2024-11-18 MED ORDER — MIDAZOLAM HCL 2 MG/2ML IJ SOLN
INTRAMUSCULAR | Status: AC
  Filled 2024-11-18: qty 2

## 2024-11-18 MED ORDER — CHLORHEXIDINE GLUCONATE 0.12 % MT SOLN
15.0000 mL | Freq: Once | OROMUCOSAL | Status: AC
  Administered 2024-11-18: 15 mL via OROMUCOSAL

## 2024-11-18 MED ORDER — SUGAMMADEX SODIUM 200 MG/2ML IV SOLN
INTRAVENOUS | Status: AC
  Filled 2024-11-18: qty 2

## 2024-11-18 MED ORDER — ACETAMINOPHEN 500 MG PO TABS
1000.0000 mg | ORAL_TABLET | ORAL | Status: AC
  Administered 2024-11-18: 1000 mg via ORAL

## 2024-11-18 MED ORDER — POVIDONE-IODINE 10 % EX SWAB
2.0000 | Freq: Once | CUTANEOUS | Status: AC
  Administered 2024-11-18: 2 via TOPICAL

## 2024-11-18 MED ORDER — AMISULPRIDE (ANTIEMETIC) 5 MG/2ML IV SOLN: 10.0000 mg | Freq: Once | INTRAVENOUS | Status: DC | PRN

## 2024-11-18 MED ORDER — CHLORHEXIDINE GLUCONATE 0.12 % MT SOLN
OROMUCOSAL | Status: AC
  Filled 2024-11-18: qty 15

## 2024-11-18 MED ORDER — PHENYLEPHRINE 80 MCG/ML (10ML) SYRINGE FOR IV PUSH (FOR BLOOD PRESSURE SUPPORT)
PREFILLED_SYRINGE | INTRAVENOUS | Status: AC
  Filled 2024-11-18: qty 10

## 2024-11-18 MED ORDER — PROPOFOL 10 MG/ML IV BOLUS
INTRAVENOUS | Status: DC | PRN
  Administered 2024-11-18: 160 mg via INTRAVENOUS

## 2024-11-18 MED ORDER — LACTATED RINGERS IV SOLN: INTRAVENOUS | Status: DC

## 2024-11-18 MED ORDER — PHENYLEPHRINE HCL-NACL 20-0.9 MG/250ML-% IV SOLN
INTRAVENOUS | Status: DC | PRN
  Administered 2024-11-18: 30 ug/min via INTRAVENOUS

## 2024-11-18 MED ORDER — SODIUM CHLORIDE 0.9 % IV SOLN
INTRAVENOUS | Status: DC | PRN
  Administered 2024-11-18: 1000 mL

## 2024-11-18 MED ORDER — OXYCODONE HCL 5 MG PO TABS
5.0000 mg | ORAL_TABLET | Freq: Once | ORAL | Status: AC
  Administered 2024-11-18: 5 mg via ORAL

## 2024-11-18 MED ORDER — PHENYLEPHRINE 80 MCG/ML (10ML) SYRINGE FOR IV PUSH (FOR BLOOD PRESSURE SUPPORT)
PREFILLED_SYRINGE | INTRAVENOUS | Status: DC | PRN
  Administered 2024-11-18: 160 ug via INTRAVENOUS

## 2024-11-18 MED ORDER — ONDANSETRON HCL 4 MG/2ML IJ SOLN: 4.0000 mg | Freq: Once | INTRAMUSCULAR | Status: DC | PRN

## 2024-11-18 MED ORDER — SCOPOLAMINE 1 MG/3DAYS TD PT72
MEDICATED_PATCH | TRANSDERMAL | Status: AC
  Filled 2024-11-18: qty 1

## 2024-11-18 MED ORDER — SODIUM CHLORIDE 0.9 % IV SOLN
2.0000 g | INTRAVENOUS | Status: AC
  Administered 2024-11-18: 2 g via INTRAVENOUS
  Filled 2024-11-18: qty 2

## 2024-11-18 MED ORDER — ORAL CARE MOUTH RINSE: 15.0000 mL | Freq: Once | OROMUCOSAL | Status: AC

## 2024-11-18 MED ORDER — OXYCODONE HCL 5 MG PO TABS
ORAL_TABLET | ORAL | Status: AC
  Filled 2024-11-18: qty 1

## 2024-11-18 MED ORDER — SCOPOLAMINE 1 MG/3DAYS TD PT72
1.0000 | MEDICATED_PATCH | TRANSDERMAL | Status: DC
  Administered 2024-11-18: 1 mg via TRANSDERMAL

## 2024-11-18 MED ORDER — BUPIVACAINE HCL (PF) 0.5 % IJ SOLN
INTRAMUSCULAR | Status: DC | PRN
  Administered 2024-11-18: 17 mL

## 2024-11-18 MED ORDER — FENTANYL CITRATE (PF) 250 MCG/5ML IJ SOLN
INTRAMUSCULAR | Status: AC
  Filled 2024-11-18: qty 5

## 2024-11-18 MED ORDER — METRONIDAZOLE 500 MG/100ML IV SOLN
500.0000 mg | INTRAVENOUS | Status: AC
  Administered 2024-11-18: 500 mg via INTRAVENOUS
  Filled 2024-11-18: qty 100

## 2024-11-18 MED ORDER — ROCURONIUM BROMIDE 10 MG/ML (PF) SYRINGE
PREFILLED_SYRINGE | INTRAVENOUS | Status: DC | PRN
  Administered 2024-11-18: 60 mg via INTRAVENOUS

## 2024-11-18 MED ADMIN — Dexamethasone Sod Phosphate Preservative Free Inj 10 MG/ML: 10 mg | INTRAVENOUS | NDC 70069002101

## 2024-11-18 MED ADMIN — Sodium Chloride Irrigation Soln 0.9%: 1000 mL | NDC 99999050048

## 2024-11-18 MED FILL — Lidocaine HCl Local Soln Prefilled Syringe 100 MG/5ML (2%): INTRAMUSCULAR | Qty: 5 | Status: AC

## 2024-11-18 MED FILL — Ondansetron HCl Inj 4 MG/2ML (2 MG/ML): INTRAMUSCULAR | Qty: 2 | Status: AC

## 2024-11-18 MED FILL — Propofol IV Emul 200 MG/20ML (10 MG/ML): INTRAVENOUS | Qty: 20 | Status: AC

## 2024-11-18 MED FILL — Rocuronium Bromide IV Soln Pref Syr 100 MG/10ML (10 MG/ML): INTRAVENOUS | Qty: 10 | Status: AC

## 2024-11-18 SURGICAL SUPPLY — 54 items
BARRIER ADHS 3X4 INTERCEED (GAUZE/BANDAGES/DRESSINGS) IMPLANT
CATH ROBINSON RED A/P 16FR (CATHETERS) ×3 IMPLANT
COVER BACK TABLE 60X90IN (DRAPES) ×3 IMPLANT
COVER TIP SHEARS 8 DVNC (MISCELLANEOUS) ×3 IMPLANT
DEFOGGER SCOPE WARM SEASHARP (MISCELLANEOUS) ×3 IMPLANT
DERMABOND ADVANCED .7 DNX12 (GAUZE/BANDAGES/DRESSINGS) ×3 IMPLANT
DRAPE ARM DVNC X/XI (DISPOSABLE) ×12 IMPLANT
DRAPE COLUMN DVNC XI (DISPOSABLE) ×3 IMPLANT
DRAPE SURG IRRIG POUCH 19X23 (DRAPES) ×3 IMPLANT
DRAPE UTILITY XL STRL (DRAPES) ×3 IMPLANT
DRIVER NDL MEGA SUTCUT DVNCXI (INSTRUMENTS) ×3 IMPLANT
DURAPREP 26ML APPLICATOR (WOUND CARE) ×3 IMPLANT
ELECTRODE REM PT RTRN 9FT ADLT (ELECTROSURGICAL) ×3 IMPLANT
FORCEPS PROGRASP DVNC XI (FORCEP) ×3 IMPLANT
GAUZE 4X4 16PLY ~~LOC~~+RFID DBL (SPONGE) IMPLANT
GLOVE BIOGEL PI IND STRL 6.5 (GLOVE) ×3 IMPLANT
GLOVE BIOGEL PI IND STRL 7.0 (GLOVE) ×6 IMPLANT
GLOVE NEODERM STER SZ 7 (GLOVE) ×9 IMPLANT
GOWN STRL REUS W/ TWL LRG LVL3 (GOWN DISPOSABLE) ×6 IMPLANT
HOLDER FOLEY CATH W/STRAP (MISCELLANEOUS) IMPLANT
IRRIGATION SUCT STRKRFLW 2 WTP (MISCELLANEOUS) ×3 IMPLANT
KIT PINK PAD W/HEAD ARM REST (MISCELLANEOUS) ×3 IMPLANT
KIT TURNOVER KIT B (KITS) ×3 IMPLANT
LEGGING LITHOTOMY PAIR STRL (DRAPES) ×3 IMPLANT
MANIFOLD NEPTUNE II (INSTRUMENTS) ×3 IMPLANT
OBTURATOR OPTICALSTD 8 DVNC (TROCAR) ×3 IMPLANT
OCCLUDER COLPOPNEUMO (BALLOONS) IMPLANT
PACK CYSTO (CUSTOM PROCEDURE TRAY) ×3 IMPLANT
PACK ROBOT WH (CUSTOM PROCEDURE TRAY) ×3 IMPLANT
PACK ROBOTIC GOWN (GOWN DISPOSABLE) ×3 IMPLANT
PACK VAGINAL MINOR WOMEN LF (CUSTOM PROCEDURE TRAY) ×3 IMPLANT
PAD OB MATERNITY 11 LF (PERSONAL CARE ITEMS) ×3 IMPLANT
POWDER SURGICEL 3.0 GRAM (HEMOSTASIS) IMPLANT
RUMI II 3.0CM BLUE KOH-EFFICIE (DISPOSABLE) IMPLANT
RUMI II GYRUS 2.5CM BLUE (DISPOSABLE) IMPLANT
RUMI II GYRUS 3.5CM BLUE (DISPOSABLE) IMPLANT
RUMI II GYRUS 4.0CM BLUE (DISPOSABLE) IMPLANT
SCISSORS MNPLR CVD DVNC XI (INSTRUMENTS) ×3 IMPLANT
SEAL UNIV 5-12 XI (MISCELLANEOUS) ×9 IMPLANT
SEALER VESSEL EXT DVNC XI (MISCELLANEOUS) IMPLANT
SET CYSTO IRRIGATION (SET/KITS/TRAYS/PACK) ×3 IMPLANT
SET TUBE SMOKE EVAC HIGH FLOW (TUBING) ×3 IMPLANT
SOLN 0.9% NACL POUR BTL 1000ML (IV SOLUTION) ×3 IMPLANT
SPIKE FLUID TRANSFER (MISCELLANEOUS) ×3 IMPLANT
SUT MNCRL AB 4-0 PS2 18 (SUTURE) ×3 IMPLANT
SUT VLOC 180 0 9IN GS21 (SUTURE) ×6 IMPLANT
TIP ENDOSCOPIC SURGICEL (TIP) IMPLANT
TIP RUMI ORANGE 6.7MMX12CM (TIP) IMPLANT
TIP UTERINE 6.7X10CM GRN DISP (MISCELLANEOUS) IMPLANT
TIP UTERINE 6.7X8CM BLUE DISP (MISCELLANEOUS) IMPLANT
TOWEL GREEN STERILE (TOWEL DISPOSABLE) ×3 IMPLANT
TOWEL GREEN STERILE FF (TOWEL DISPOSABLE) ×3 IMPLANT
TRAY FOLEY W/BAG SLVR 14FR (SET/KITS/TRAYS/PACK) ×3 IMPLANT
UNDERPAD 30X36 HEAVY ABSORB (UNDERPADS AND DIAPERS) ×3 IMPLANT

## 2024-11-18 NOTE — Anesthesia Preprocedure Evaluation (Addendum)
 Anesthesia Evaluation  Patient identified by MRN, date of birth, ID band Patient awake    Reviewed: Allergy & Precautions, NPO status , Patient's Chart, lab work & pertinent test results  Airway Mallampati: III  TM Distance: >3 FB Neck ROM: Full    Dental  (+) Teeth Intact, Dental Advisory Given   Pulmonary sleep apnea    Pulmonary exam normal breath sounds clear to auscultation       Cardiovascular hypertension, Pt. on medications (-) angina (-) Past MI Normal cardiovascular exam Rhythm:Regular Rate:Normal     Neuro/Psych Seizures - (Childhood), Well Controlled,   Neuromuscular disease    GI/Hepatic negative GI ROS, Neg liver ROS,,,  Endo/Other  diabetes, Type 2Hypothyroidism  Class 3 obesity  Renal/GU negative Renal ROS     Musculoskeletal  (+) Arthritis ,    Abdominal   Peds  Hematology negative hematology ROS (+)   Anesthesia Other Findings Day of surgery medications reviewed with the patient.  Reproductive/Obstetrics Fibroids  PMB (postmenopausal bleeding)  Menorrhagia with irregular cycle                                Anesthesia Physical Anesthesia Plan  ASA: 3  Anesthesia Plan: General   Post-op Pain Management: Tylenol  PO (pre-op)* and Toradol IV (intra-op)*   Induction: Intravenous  PONV Risk Score and Plan: 4 or greater and Scopolamine patch - Pre-op, Midazolam, Dexamethasone and Ondansetron   Airway Management Planned: Oral ETT  Additional Equipment:   Intra-op Plan:   Post-operative Plan: Extubation in OR  Informed Consent: I have reviewed the patients History and Physical, chart, labs and discussed the procedure including the risks, benefits and alternatives for the proposed anesthesia with the patient or authorized representative who has indicated his/her understanding and acceptance.     Dental advisory given  Plan Discussed with: CRNA  Anesthesia Plan  Comments: (2nd PIV)         Anesthesia Quick Evaluation

## 2024-11-18 NOTE — Discharge Instructions (Addendum)
°  Post Anesthesia Home Care Instructions  Activity: Get plenty of rest for the remainder of the day. A responsible individual must stay with you for 24 hours following the procedure.  For the next 24 hours, DO NOT: -Drive a car -Advertising copywriter -Drink alcoholic beverages -Take any medication unless instructed by your physician -Make any legal decisions or sign important papers.  Meals: Start with liquid foods such as gelatin or soup. Progress to regular foods as tolerated. Avoid greasy, spicy, heavy foods. If nausea and/or vomiting occur, drink only clear liquids until the nausea and/or vomiting subsides. Call your physician if vomiting continues.  Special Instructions/Symptoms: Your throat may feel dry or sore from the anesthesia or the breathing tube placed in your throat during surgery. If this causes discomfort, gargle with warm salt water. The discomfort should disappear within 24 hours.  If you had a scopolamine patch placed behind your ear for the management of post- operative nausea and/or vomiting:  1. The medication in the patch is effective for 72 hours, after which it should be removed.  Wrap patch in a tissue and discard in the trash. Wash hands thoroughly with soap and water. 2. You may remove the patch earlier than 72 hours if you experience unpleasant side effects which may include dry mouth, dizziness or visual disturbances. 3. Avoid touching the patch. Wash your hands with soap and water after contact with the patch.  Do not take any Tylenol  until after 3:30 pm

## 2024-11-18 NOTE — Interval H&P Note (Signed)
 History and Physical Interval Note:  11/18/2024 9:12 AM  Pam Mckee  has presented today for surgery, with the diagnosis of fibroids, PMB, menorrhagia with irregular cycle.  The various methods of treatment have been discussed with the patient and family. After consideration of risks, benefits and other options for treatment, the patient has consented to  Procedures: HYSTERECTOMY, TOTAL, LAPAROSCOPIC, ROBOT-ASSISTED WITH SALPINGECTOMY (Bilateral) CYSTOSCOPY (N/A) REMOVAL, INTRAUTERINE DEVICE (N/A) as a surgical intervention.  The patient's history has been reviewed, patient examined, no change in status, stable for surgery.  I have reviewed the patient's chart and labs.  Questions were answered to the patient's satisfaction.     Pam Mckee

## 2024-11-18 NOTE — Anesthesia Postprocedure Evaluation (Signed)
 Anesthesia Post Note  Patient: EVYN KOOYMAN  Procedure(s) Performed: HYSTERECTOMY, TOTAL, LAPAROSCOPIC, ROBOT-ASSISTED WITH SALPINGECTOMY (Bilateral: Pelvis) CYSTOSCOPY (Bladder) REMOVAL, INTRAUTERINE DEVICE (Vagina )     Patient location during evaluation: PACU Anesthesia Type: General Level of consciousness: awake and alert Pain management: pain level controlled Vital Signs Assessment: post-procedure vital signs reviewed and stable Respiratory status: spontaneous breathing, nonlabored ventilation and respiratory function stable Cardiovascular status: blood pressure returned to baseline and stable Postop Assessment: no apparent nausea or vomiting Anesthetic complications: no   There were no known notable events for this encounter.  Last Vitals:  Vitals:   11/18/24 1315 11/18/24 1319  BP: (!) 140/82   Pulse: 67 71  Resp: 13 20  Temp:    SpO2: 99% 98%    Last Pain:  Vitals:   11/18/24 1338  TempSrc:   PainSc: 6                  Garnette FORBES Skillern

## 2024-11-18 NOTE — Op Note (Signed)
 11/18/2024  [id number] Arland LOISE Hutchinson        OPERATIVE REPORT   Preop Diagnosis: menorrhagia, dysmenorrhea, anemia, fibroids  Procedure: robotic hysterectomy, bilateral salpingectomy, cystoscopy   Surgeon: Dr. Almarie Rollo Carpen Assistant:  Circulator: Raguel Sherra CROME, RN Relief Circulator: Waddell Silvano HERO, RN Relief Scrub: Neysa Joesph BROCKS Scrub Person: Janell, Marina  CINDI Rubin Hadassah HERO RN First Assistant: Starla Duwaine BROCKS, RN    Fluids: please see anesthesia report   Complications: None Anesthesia: General     Findings:  fibroid 12cm uterus, normal ovaries and tubes Cystoscopy at the end of the case with normal bladder and patent ureters bilaterally.   Estimated blood loss: Minimal <15cc   Specimens: Uterus with fibroids, cervix and bilateral tubes   Disposition of specimen: Pathology           Patient is taken to the operating room. She is placed in the supine position. She is a running IV in place. Informed consent was present on the chart. SCDs on her lower extremities and functioning properly. Patient was positioned while she was awake.  Her legs were placed in the low lithotomy position in [place] stirrups. Her arms were tucked by the side.  General endotracheal anesthesia was administered by the anesthesia staff without difficulty.       Dura prep was then used to prep the abdomen and Hibiclens was used to prep the inner thighs, perineum and vagina. Once 3 minutes had past the patient was draped in a normal standard fashion. A proper time out was performed and everyone agreed.  The legs were lifted to the high lithotomy position. A bivalve speculum was inserted into the vagina and the anterior lip of the cervix was grasped with single-tooth tenaculum.  The uterus sounded to 12 cm. Pratt dilators were used to dilate the cervix.  The RUMI uterine manipulator was obtained inserted into the endometrial cavity and the bulb of the disposable tip was inflated with 8 cc  of normal saline. There was a good fit of the KOH ring around the cervix. The tenaculum and bivavle speculum was removed. There is also good manipulation of the uterus.  A Foley catheter was placed to straight drain.  Clear urine was noted. Legs were lowered to the low lithotomy position and attention was turned the abdomen.   Superior to the umbilicus, marcaine 0.25% used to anesthetize the skin.  Using #11 blade, 8mm skin incision was made.  The 8mm robotic trocar and sleeve was inserted under direct visualization.  CO2 gas was  started and patient was placed in trendelenburg position.  Two additional 8mm ports were placed under direct visualization in the left and right lower quadrant.     Ureters were identifies.  Attention was turned to the left side. The left tube was elevated and the mesosalpinx was desiccated with the vessel sealer.  The left uterine ovarian pedicle was serially clamped cauterized and incised. Left round ligament was serially clamped cauterized and incised. The anterior and posterior peritoneum of the inferior leaf of the broad ligament were opened. The beginning of the bladder flap was created.  The bladder was taken down below the level of the KOH ring. The left uterine artery skeletonized and then just superior to the KOH ring this vessel was serially clamped, cauterized, and incised.   Attention was turned the right side.  The uterus was placed on stretch to the opposite side.    The mesosalpinx was incised freeing the tube. Then the right  uterine ovarian pedicle was serially clamped cauterized and incised. Next the right round ligament was serially clamped cauterized and incised. The anterior posterior peritoneum of the inferiorly for the broad ligament were opened. The anterior peritoneum was carried across to the dissection on the left side. The remainder of the bladder flap was created using sharp dissection. The bladder was well below the level of the KOH ring. The right  uterine artery skeletonized. Then the right uterine artery, above the level of the KOH ring, was serially clamped cauterized and incised. The uterus was devascularized at this point.   The colpotomy was performed.  This was carried around a circumferential fashion until the vaginal mucosa was completely incised in the specimen was freed.  The specimen was then delivered to the vagina intact.  A vaginal occlusive device was used to maintain the pneumoperitoneum   Instruments were changed with a needle driver and prograsp.  Using a 9 inch  zero V-lock suture, the cuff was closed by incorporating the anterior and posterior vaginal mucosa in each stitch. This was carried across all the way to the left corner and a running fashion. Two stitches were brought back towards the midline and the suture was cut flush with the vagina. The needle was brought out the pelvis. The pelvis was irrigated. All pedicles were inspected. No bleeding was noted.   Co2 pressures were lowered to 8mm Hg.  Again, no bleeding was noted.  Ureters were noted deep in the pelvis to be peristalsing.  At this point the procedure was completed.  The remaining instruments were removed.  The ports were removed under direct visualization of the laparoscope and the pneumoperitoneum was relieved.   The skin was then closed with subcuticular stitches of 3-0 Vicryl. The skin was cleansed Dermabond was applied. Attention was then turned the vagina and the cuff was inspected. No bleeding was noted.  The Foley catheter was removed.  Cystoscopy was performed.  No sutures or bladder injuries were noted.  Ureters were noted with normal urine jets from each one was seen.  Foley was left out after the cystoscopic fluid was drained and cystoscope removed.  Sponge, lap, needle, instrument counts were correct x2. Patient tolerated the procedure very well. She was awakened from anesthesia, extubated and taken to recovery in stable condition.      Dr. Glennon

## 2024-11-18 NOTE — Transfer of Care (Signed)
 Immediate Anesthesia Transfer of Care Note  Patient: Pam Mckee  Procedure(s) Performed: HYSTERECTOMY, TOTAL, LAPAROSCOPIC, ROBOT-ASSISTED WITH SALPINGECTOMY (Bilateral: Pelvis) CYSTOSCOPY (Bladder) REMOVAL, INTRAUTERINE DEVICE (Vagina )  Patient Location: PACU  Anesthesia Type:General  Level of Consciousness: awake, alert , oriented, drowsy, and patient cooperative  Airway & Oxygen Therapy: Patient Spontanous Breathing and Patient connected to face mask oxygen  Post-op Assessment: Report given to RN, Post -op Vital signs reviewed and stable, and Patient moving all extremities  Post vital signs: Reviewed and stable  Last Vitals:  Vitals Value Taken Time  BP 140/88 11/18/24 13:00  Temp 36.6 C 11/18/24 13:04  Pulse 64 11/18/24 13:03  Resp 9 11/18/24 13:03  SpO2 100 % 11/18/24 13:03  Vitals shown include unfiled device data.  Last Pain:  Vitals:   11/18/24 0924  TempSrc:   PainSc: 0-No pain      Patients Stated Pain Goal: 6 (11/18/24 9075)  Complications: There were no known notable events for this encounter.

## 2024-11-19 ENCOUNTER — Encounter (HOSPITAL_COMMUNITY): Payer: Self-pay | Admitting: Obstetrics and Gynecology

## 2024-11-21 ENCOUNTER — Ambulatory Visit: Payer: Self-pay | Admitting: Obstetrics and Gynecology

## 2024-11-21 LAB — SURGICAL PATHOLOGY, GROSS ONLY (NOT ARMC)

## 2024-11-28 ENCOUNTER — Other Ambulatory Visit: Payer: Self-pay

## 2024-11-28 ENCOUNTER — Encounter: Payer: Self-pay | Admitting: Nurse Practitioner

## 2024-11-28 ENCOUNTER — Ambulatory Visit: Admitting: Nurse Practitioner

## 2024-11-28 VITALS — BP 128/83 | HR 68 | Temp 98.0°F | Ht 66.0 in | Wt 243.0 lb

## 2024-11-28 DIAGNOSIS — E119 Type 2 diabetes mellitus without complications: Secondary | ICD-10-CM | POA: Diagnosis not present

## 2024-11-28 DIAGNOSIS — Z7985 Long-term (current) use of injectable non-insulin antidiabetic drugs: Secondary | ICD-10-CM

## 2024-11-28 DIAGNOSIS — E66812 Obesity, class 2: Secondary | ICD-10-CM | POA: Diagnosis not present

## 2024-11-28 DIAGNOSIS — Z6839 Body mass index (BMI) 39.0-39.9, adult: Secondary | ICD-10-CM

## 2024-11-28 DIAGNOSIS — E559 Vitamin D deficiency, unspecified: Secondary | ICD-10-CM | POA: Diagnosis not present

## 2024-11-28 DIAGNOSIS — R7989 Other specified abnormal findings of blood chemistry: Secondary | ICD-10-CM

## 2024-11-28 MED ORDER — TIRZEPATIDE 5 MG/0.5ML ~~LOC~~ SOAJ
5.0000 mg | SUBCUTANEOUS | 0 refills | Status: DC
  Filled 2024-11-28: qty 2, 28d supply, fill #0

## 2024-11-28 MED ORDER — "BD DISP NEEDLE 25G X 1"" MISC"
0 refills | Status: AC
  Filled 2024-11-28: qty 6, 6d supply, fill #0

## 2024-11-28 MED ORDER — VITAMIN D (ERGOCALCIFEROL) 1.25 MG (50000 UNIT) PO CAPS
50000.0000 [IU] | ORAL_CAPSULE | ORAL | 2 refills | Status: AC
  Filled 2024-11-28 – 2024-12-02 (×2): qty 4, 28d supply, fill #0
  Filled 2024-12-20: qty 5, 35d supply, fill #0
  Filled 2024-12-22: qty 4, 28d supply, fill #0

## 2024-11-28 MED ORDER — CYANOCOBALAMIN 1000 MCG/ML IJ SOLN
1000.0000 ug | INTRAMUSCULAR | 2 refills | Status: AC
  Filled 2024-11-28: qty 1, 30d supply, fill #0
  Filled 2025-01-09 – 2025-01-11 (×3): qty 1, 30d supply, fill #1

## 2024-11-28 NOTE — Progress Notes (Signed)
 "  Office: [phone]  /  Fax: [phone]  WEIGHT SUMMARY AND BIOMETRICS  Weight Lost Since Last Visit: 0lb  Weight Gained Since Last Visit: 2lb   Vitals Temp: 98 F (36.7 C) BP: 128/83 Pulse Rate: 68 SpO2: 100 %   Anthropometric Measurements Height: 5' 6 (1.676 m) Weight: 243 lb (110.2 kg) BMI (Calculated): 39.24 Weight at Last Visit: 241lb Weight Lost Since Last Visit: 0lb Weight Gained Since Last Visit: 2lb Starting Weight: 290lb Total Weight Loss (lbs): 47 lb (21.3 kg)   Body Composition  Body Fat %: 48.5 % Fat Mass (lbs): 118 lbs Muscle Mass (lbs): 118.8 lbs Total Body Water (lbs): 89.6 lbs Visceral Fat Rating : 14   Other Clinical Data Fasting: Yes Labs: No Today's Visit #: 16 Starting Date: 04/21/23     HPI  Chief Complaint: OBESITY  Spruha is here to discuss her progress with her obesity treatment plan. She is on the the Category 3 Plan and states she is following her eating plan approximately 50 % of the time. She states she is exercising 0 minutes 0 days per week.   Interval History:  Since last office visit she has gained 2 pounds.  She had a hysterectomy 11/18/24.  She has a follow up post op appt on 12/02/24. She is not currently following a meal plan.  She is planning to start back Cat 3 meal plan.  She is drinking water.   She is not currently exercising due to recent surgery.    Pharmacotherapy for weight loss: She is not currently taking medications  for medical weight loss.     Previous pharmacotherapy for medical weight loss:  Ozempic    Bariatric surgery:  Patient has not had bariatric surgery  Pharmacotherapy for DMT2:   She is currently taking Mounjaro  15mg  (took last Nov 28th for her surgery).  Denies side effects.   Last A1c was 5.7 on 08/31/24 She is not checking BS at home.   Episodes of hypoglycemia: Denies  She is on an ARB and statin-Cozaar  100mg  and Crestor  10mg  Last eye exam:  July 2025 She has tried Ozempic  in the  past    Mounjaro  approved from 07/11/2024 to 07/10/2025  Lab Results  Component Value Date   HGBA1C 5.7 (H) 08/31/2024   HGBA1C 5.6 03/02/2024   HGBA1C 6.1 (H) 10/29/2023   Lab Results  Component Value Date   MICROALBUR 80 02/08/2024   LDLCALC 90 08/31/2024   CREATININE 0.79 11/18/2024     Vit D deficiency  She is not taking Vit D.  Reports fatigue.    Lab Results  Component Value Date   VD25OH 33.3 08/31/2024   VD25OH 44.4 03/02/2024   VD25OH 59.5 10/29/2023    Vit B12 def She is not currently taking Vit B12.  She is taking a MVI. Reports fatigue   PHYSICAL EXAM:  Blood pressure 128/83, pulse 68, temperature 98 F (36.7 C), height 5' 6 (1.676 m), weight 243 lb (110.2 kg), SpO2 100%. Body mass index is 39.22 kg/m.  General: She is overweight, cooperative, alert, well developed, and in no acute distress. PSYCH: Has normal mood, affect and thought process.   Extremities: No edema.  Neurologic: No gross sensory or motor deficits. No tremors or fasciculations noted.    DIAGNOSTIC DATA REVIEWED:  BMET    Component Value Date/Time   NA 136 11/18/2024 0930   NA 138 08/31/2024 0753   K 3.3 (L) 11/18/2024 0930   CL 101 11/18/2024 0930  CO2 26 11/18/2024 0930   GLUCOSE 90 11/18/2024 0930   BUN 18 11/18/2024 0930   BUN 18 08/31/2024 0753   CREATININE 0.79 11/18/2024 0930   CREATININE 0.84 09/09/2016 0837   CALCIUM  8.3 (L) 11/18/2024 0930   GFRNONAA >60 11/18/2024 0930   GFRNONAA >89 01/14/2016 1328   GFRAA 107 07/17/2020 0835   GFRAA >89 01/14/2016 1328   Lab Results  Component Value Date   HGBA1C 5.7 (H) 08/31/2024   HGBA1C 5.5 01/14/2016   Lab Results  Component Value Date   INSULIN  74.3 (H) 04/21/2023   Lab Results  Component Value Date   TSH 3.720 08/31/2024   CBC    Component Value Date/Time   WBC 7.3 11/16/2024 1140   RBC 4.69 11/16/2024 1140   HGB 12.2 11/16/2024 1140   HGB 12.5 08/31/2024 0753   HCT 37.4 11/16/2024 1140   HCT 41.1  08/31/2024 0753   PLT 291 11/16/2024 1140   PLT 348 08/31/2024 0753   MCV 79.7 (L) 11/16/2024 1140   MCV 83 08/31/2024 0753   MCH 26.0 11/16/2024 1140   MCHC 32.6 11/16/2024 1140   RDW 15.1 11/16/2024 1140   RDW 13.6 08/31/2024 0753   Iron  Studies    Component Value Date/Time   IRON  74 03/02/2024 0751   TIBC 371 07/12/2015 1826   FERRITIN 160 (H) 08/31/2024 0753   IRONPCTSAT 8 (L) 07/12/2015 1826   Lipid Panel     Component Value Date/Time   CHOL 151 08/31/2024 0753   TRIG 101 08/31/2024 0753   HDL 42 08/31/2024 0753   CHOLHDL 5.3 (H) 07/17/2020 0835   CHOLHDL 4.3 09/09/2016 0825   VLDL 10 09/09/2016 0825   LDLCALC 90 08/31/2024 0753   Hepatic Function Panel     Component Value Date/Time   PROT 6.8 08/31/2024 0753   ALBUMIN 4.1 08/31/2024 0753   AST 14 08/31/2024 0753   ALT 13 08/31/2024 0753   ALKPHOS 98 08/31/2024 0753   BILITOT 0.4 08/31/2024 0753      Component Value Date/Time   TSH 3.720 08/31/2024 0753   Nutritional Lab Results  Component Value Date   VD25OH 33.3 08/31/2024   VD25OH 44.4 03/02/2024   VD25OH 59.5 10/29/2023     ASSESSMENT AND PLAN  TREATMENT PLAN FOR OBESITY:  Recommended Dietary Goals  Jack is currently in the action stage of change. As such, her goal is to continue weight management plan. She has agreed to the Category 3 Plan.  Behavioral Intervention  We discussed the following Behavioral Modification Strategies today: increasing lean protein intake to established goals, decreasing simple carbohydrates , increasing vegetables, increasing fiber rich foods, increasing water intake , work on meal planning and preparation, reading food labels , planning for success, celebration eating strategies, continue to work on maintaining a reduced calorie state, getting the recommended amount of protein, incorporating whole foods, making healthy choices, staying well hydrated and practicing mindfulness when eating., and increase protein  intake, fibrous foods (25 grams per day for women, 30 grams for men) and water to improve satiety and decrease hunger signals. .  Additional resources provided today: NA  Recommended Physical Activity Goals  Darica has been advised to work up to 150 minutes of moderate intensity aerobic activity a week and strengthening exercises 2-3 times per week for cardiovascular health, weight loss maintenance and preservation of muscle mass.   She has agreed to exercise per surgeon recommendations.     ASSOCIATED CONDITIONS ADDRESSED TODAY  Action/Plan  Type  2 diabetes mellitus without complication, without long-term current use of insulin  (HCC) -     Decrease Tirzepatide ; Inject 5 mg into the skin once a week.  Dispense: 2 mL; Refill: 0. Side effects discussed.  Due to patient being off over 3 weeks.  To contact me in one month to let me know how she is doing.  If doing well, will increase to 7.5mg .    Vitamin D  deficiency -     Start Vitamin D  (Ergocalciferol ); Take 1 capsule (50,000 Units total) by mouth every 7 (seven) days.  Dispense: 5 capsule; Refill: 2. Side effects discussed   Low vitamin B12 level -     Start Cyanocobalamin ; Inject 1 mL (1,000 mcg total) into the muscle every 30 (thirty) days.  Dispense: 1 mL; Refill: 2. Side effects discussed.   Obesity, Class II, BMI 35-39.9     To have labs rechecked with GYN this Friday.   Labs reviewed in chart with patient from 08/31/24  Return in about 3 months (around 02/26/2025).Pam Mckee She was informed of the importance of frequent follow up visits to maximize her success with intensive lifestyle modifications for her multiple health conditions.   ATTESTASTION STATEMENTS:  Reviewed by clinician on day of visit: allergies, medications, problem list, medical history, surgical history, family history, social history, and previous encounter notes.   Corean SAUNDERS. Darolyn Double FNP-C "

## 2024-12-02 ENCOUNTER — Ambulatory Visit (INDEPENDENT_AMBULATORY_CARE_PROVIDER_SITE_OTHER): Admitting: Obstetrics and Gynecology

## 2024-12-02 ENCOUNTER — Encounter: Payer: Self-pay | Admitting: Obstetrics and Gynecology

## 2024-12-02 ENCOUNTER — Other Ambulatory Visit: Payer: Self-pay

## 2024-12-02 VITALS — BP 114/76 | HR 85

## 2024-12-02 DIAGNOSIS — Z09 Encounter for follow-up examination after completed treatment for conditions other than malignant neoplasm: Secondary | ICD-10-CM

## 2024-12-02 MED ORDER — AZITHROMYCIN 500 MG PO TABS
500.0000 mg | ORAL_TABLET | Freq: Every day | ORAL | 0 refills | Status: AC
  Filled 2024-12-02: qty 7, 7d supply, fill #0

## 2024-12-02 NOTE — Progress Notes (Signed)
 Patient presents for 2 week postop from [facility], bilateral salpingectomy, cystoscopy. She is doing well. No fevers, VB, dysuria or severe abdominal pain. Non productive cough since surgery. No fevers.  BP 114/76 (BP Location: Right Arm, Patient Position: Sitting)   Pulse 85   LMP  (LMP Unknown)   SpO2 97%   Abdomen: incisions I/c/d, NT, ND  A/p PO from [facility] 2 weeks doing well Encouraged no heavy lifting, pushing, pulling greater than 10 lbs for full 8 weeks 2. Pelvic rest until cleared 3. RTC with any concerns or with heavy bleeding, fevers or severe abdominal pain. 4. Begin zpack. RTC with persistent or worsening s/s Dr. Glennon

## 2024-12-13 ENCOUNTER — Telehealth: Payer: Self-pay | Admitting: *Deleted

## 2024-12-13 ENCOUNTER — Other Ambulatory Visit: Payer: Self-pay

## 2024-12-13 NOTE — Telephone Encounter (Signed)
 Message received from [place], patient requesting RTW letter.   S/P [facility] B/S on 11/18/24 2 wk post op on 12/02/24  Call placed to patient, Left message to call Kate, RN at [facility], [phone], option 4 to advise when she is planning to RTW.

## 2024-12-14 ENCOUNTER — Encounter: Payer: Self-pay | Admitting: Obstetrics and Gynecology

## 2024-12-14 NOTE — Telephone Encounter (Signed)
 Arland LOISE Hutchinson to [facility] (supporting Almarie MARLA Carpen, MD)     12/14/24  7:44 AM Hello ,  This message is for Kate or Chistine , I would be going back to work on Monday the 12th full time. I can come pick the letter up today or tomorrow or if you can email me the letter @ [email or web address]   Thank you

## 2024-12-14 NOTE — Telephone Encounter (Signed)
 Dr. Glennon -ok to RTW on 12/19/24 with the following restrictions: no heavy lifting, pushing, pulling greater than 10 lbs for full 8 weeks

## 2024-12-14 NOTE — Telephone Encounter (Signed)
 See open telephone encounter dated 12/13/24.   Will close this encounter.

## 2024-12-20 ENCOUNTER — Other Ambulatory Visit: Payer: Self-pay

## 2024-12-20 ENCOUNTER — Encounter: Payer: Self-pay | Admitting: Nurse Practitioner

## 2024-12-21 ENCOUNTER — Other Ambulatory Visit: Payer: Self-pay

## 2024-12-21 ENCOUNTER — Other Ambulatory Visit: Payer: Self-pay | Admitting: Nurse Practitioner

## 2024-12-21 DIAGNOSIS — E119 Type 2 diabetes mellitus without complications: Secondary | ICD-10-CM

## 2024-12-21 MED ORDER — TIRZEPATIDE 7.5 MG/0.5ML ~~LOC~~ SOAJ
7.5000 mg | SUBCUTANEOUS | 0 refills | Status: AC
  Filled 2024-12-21: qty 2, 28d supply, fill #0

## 2024-12-22 ENCOUNTER — Other Ambulatory Visit: Payer: Self-pay

## 2024-12-26 ENCOUNTER — Other Ambulatory Visit: Payer: Self-pay

## 2024-12-28 ENCOUNTER — Other Ambulatory Visit: Payer: Self-pay

## 2024-12-28 ENCOUNTER — Encounter: Payer: Self-pay | Admitting: Obstetrics and Gynecology

## 2024-12-28 ENCOUNTER — Ambulatory Visit (INDEPENDENT_AMBULATORY_CARE_PROVIDER_SITE_OTHER): Admitting: Obstetrics and Gynecology

## 2024-12-28 VITALS — BP 106/74 | HR 71 | Wt 245.0 lb

## 2024-12-28 DIAGNOSIS — Z09 Encounter for follow-up examination after completed treatment for conditions other than malignant neoplasm: Secondary | ICD-10-CM

## 2024-12-28 MED ORDER — NYSTATIN 100000 UNIT/GM EX POWD
1.0000 | Freq: Three times a day (TID) | CUTANEOUS | 3 refills | Status: AC
  Filled 2024-12-28: qty 15, 15d supply, fill #0

## 2024-12-28 MED ORDER — FLUCONAZOLE 150 MG PO TABS
150.0000 mg | ORAL_TABLET | Freq: Once | ORAL | 2 refills | Status: AC
  Filled 2024-12-28: qty 1, 1d supply, fill #0
  Filled 2025-01-09 (×2): qty 1, 1d supply, fill #1

## 2024-12-28 NOTE — Progress Notes (Addendum)
 Patient presents for 2 week postop from [facility], bilateral salpingectomy, cystoscopy. She is doing well. No fevers, VB, dysuria or severe abdominal pain.  BP 106/74   Pulse 71   Wt 245 lb (111.1 kg)   LMP  (LMP Unknown)   SpO2 99%   BMI 39.54 kg/m    Does report bothersome yeast infection in her groin on both sides A/p PO from [facility] 6 weeks doing well Resume life activities except intercourse 2. Pelvic rest until cleared. 3. RTC with any concerns or with heavy bleeding, fevers or severe abdominal pain and with 10 wk PO check 4. To begin nystatin  powder and diflucan  x1 for yeast like symptoms in the groin Dr. Glennon

## 2024-12-28 NOTE — Addendum Note (Signed)
 Addended by: GLENNON ALMARIE POUR on: 12/28/2024 02:09 PM   Modules accepted: Orders

## 2025-01-09 ENCOUNTER — Other Ambulatory Visit: Payer: Self-pay

## 2025-01-09 ENCOUNTER — Other Ambulatory Visit (HOSPITAL_BASED_OUTPATIENT_CLINIC_OR_DEPARTMENT_OTHER): Payer: Self-pay | Admitting: Family Medicine

## 2025-01-09 DIAGNOSIS — B372 Candidiasis of skin and nail: Secondary | ICD-10-CM

## 2025-01-09 MED ORDER — TRIAMCINOLONE ACETONIDE 0.1 % EX OINT
1.0000 | TOPICAL_OINTMENT | Freq: Two times a day (BID) | CUTANEOUS | 0 refills | Status: AC
  Filled 2025-01-09: qty 30, 15d supply, fill #0

## 2025-01-10 ENCOUNTER — Other Ambulatory Visit: Payer: Self-pay

## 2025-01-11 ENCOUNTER — Other Ambulatory Visit: Payer: Self-pay

## 2025-02-01 ENCOUNTER — Encounter: Admitting: Obstetrics and Gynecology

## 2025-02-15 ENCOUNTER — Ambulatory Visit: Admitting: Nurse Practitioner
